# Patient Record
Sex: Male | Born: 1955
Health system: Southern US, Community
[De-identification: ages and names within clinical notes are randomized; demographics above are authoritative.]

## PROBLEM LIST (undated history)

## (undated) DIAGNOSIS — E119 Type 2 diabetes mellitus without complications: Secondary | ICD-10-CM

## (undated) DIAGNOSIS — R911 Solitary pulmonary nodule: Secondary | ICD-10-CM

## (undated) DIAGNOSIS — I10 Essential (primary) hypertension: Secondary | ICD-10-CM

## (undated) DIAGNOSIS — K219 Gastro-esophageal reflux disease without esophagitis: Secondary | ICD-10-CM

## (undated) DIAGNOSIS — A319 Mycobacterial infection, unspecified: Secondary | ICD-10-CM

## (undated) DIAGNOSIS — R059 Cough, unspecified: Secondary | ICD-10-CM

## (undated) DIAGNOSIS — J449 Chronic obstructive pulmonary disease, unspecified: Secondary | ICD-10-CM

## (undated) DIAGNOSIS — R05 Cough: Secondary | ICD-10-CM

## (undated) HISTORY — DX: Essential (primary) hypertension: I10

## (undated) HISTORY — DX: Cough: R05

## (undated) HISTORY — DX: Solitary pulmonary nodule: R91.1

## (undated) HISTORY — DX: Gastro-esophageal reflux disease without esophagitis: K21.9

## (undated) HISTORY — DX: Cough, unspecified: R05.9

## (undated) HISTORY — DX: Chronic obstructive pulmonary disease, unspecified: J44.9

## (undated) HISTORY — DX: Type 2 diabetes mellitus without complications: E11.9

## (undated) HISTORY — DX: Mycobacterial infection, unspecified: A31.9

## (undated) HISTORY — PX: BASAL CELL CARCINOMA EXCISION: SHX1214

---

## 2007-03-23 ENCOUNTER — Ambulatory Visit: Payer: Self-pay | Admitting: Internal Medicine

## 2007-04-25 ENCOUNTER — Ambulatory Visit: Payer: Self-pay | Admitting: Internal Medicine

## 2007-05-10 ENCOUNTER — Ambulatory Visit: Payer: Self-pay | Admitting: Internal Medicine

## 2007-06-14 ENCOUNTER — Ambulatory Visit: Payer: Self-pay | Admitting: Pulmonary Disease

## 2007-07-05 ENCOUNTER — Ambulatory Visit: Payer: Self-pay | Admitting: Internal Medicine

## 2007-08-16 ENCOUNTER — Ambulatory Visit: Payer: Self-pay | Admitting: Internal Medicine

## 2007-08-16 LAB — CONVERTED CEMR LAB
Basophils Absolute: 0.1 10*3/uL (ref 0.0–0.1)
Chloride: 107 meq/L (ref 96–112)
Direct LDL: 103.7 mg/dL
Eosinophils Absolute: 1 10*3/uL — ABNORMAL HIGH (ref 0.0–0.6)
GFR calc Af Amer: 132 mL/min
GFR calc non Af Amer: 109 mL/min
HCT: 44.5 % (ref 39.0–52.0)
HDL: 33.7 mg/dL — ABNORMAL LOW (ref >39.0)
MCHC: 35.5 g/dL (ref 30.0–36.0)
MCV: 90.9 fL (ref 78.0–100.0)
Neutro Abs: 5 10*3/uL (ref 1.4–7.7)
Neutrophils Relative %: 59.9 % (ref 43.0–77.0)
Platelets: 280 10*3/uL (ref 150–400)
RBC: 4.9 M/uL (ref 4.22–5.81)
Sodium: 143 meq/L (ref 135–145)
Triglycerides: 231 mg/dL (ref 0–149)

## 2007-09-01 ENCOUNTER — Ambulatory Visit: Payer: Self-pay | Admitting: Internal Medicine

## 2007-09-20 ENCOUNTER — Ambulatory Visit: Payer: Self-pay | Admitting: Pulmonary Disease

## 2007-10-07 ENCOUNTER — Ambulatory Visit: Payer: Self-pay | Admitting: Emergency Medicine

## 2007-10-08 DIAGNOSIS — I10 Essential (primary) hypertension: Secondary | ICD-10-CM

## 2007-10-08 DIAGNOSIS — J449 Chronic obstructive pulmonary disease, unspecified: Secondary | ICD-10-CM

## 2007-10-08 DIAGNOSIS — K219 Gastro-esophageal reflux disease without esophagitis: Secondary | ICD-10-CM | POA: Insufficient documentation

## 2007-10-08 DIAGNOSIS — E1159 Type 2 diabetes mellitus with other circulatory complications: Secondary | ICD-10-CM | POA: Insufficient documentation

## 2007-10-08 DIAGNOSIS — J45909 Unspecified asthma, uncomplicated: Secondary | ICD-10-CM | POA: Insufficient documentation

## 2007-10-11 ENCOUNTER — Ambulatory Visit: Payer: Self-pay | Admitting: Cardiology

## 2007-10-21 ENCOUNTER — Ambulatory Visit: Payer: Self-pay | Admitting: Emergency Medicine

## 2007-12-09 ENCOUNTER — Ambulatory Visit: Payer: Self-pay | Admitting: Emergency Medicine

## 2007-12-09 DIAGNOSIS — J309 Allergic rhinitis, unspecified: Secondary | ICD-10-CM | POA: Insufficient documentation

## 2007-12-09 DIAGNOSIS — R059 Cough, unspecified: Secondary | ICD-10-CM | POA: Insufficient documentation

## 2007-12-09 DIAGNOSIS — R05 Cough: Secondary | ICD-10-CM

## 2007-12-09 DIAGNOSIS — R918 Other nonspecific abnormal finding of lung field: Secondary | ICD-10-CM | POA: Insufficient documentation

## 2007-12-09 DIAGNOSIS — J984 Other disorders of lung: Secondary | ICD-10-CM

## 2007-12-12 ENCOUNTER — Ambulatory Visit: Payer: Self-pay | Admitting: Cardiology

## 2007-12-18 DIAGNOSIS — J479 Bronchiectasis, uncomplicated: Secondary | ICD-10-CM | POA: Insufficient documentation

## 2008-01-16 ENCOUNTER — Ambulatory Visit: Payer: Self-pay | Admitting: Emergency Medicine

## 2008-01-18 DIAGNOSIS — A319 Mycobacterial infection, unspecified: Secondary | ICD-10-CM | POA: Insufficient documentation

## 2008-01-20 ENCOUNTER — Encounter: Payer: Self-pay | Admitting: Emergency Medicine

## 2008-01-21 ENCOUNTER — Encounter: Payer: Self-pay | Admitting: Emergency Medicine

## 2008-03-19 ENCOUNTER — Ambulatory Visit: Payer: Self-pay | Admitting: Emergency Medicine

## 2008-05-17 ENCOUNTER — Ambulatory Visit: Payer: Self-pay | Admitting: Emergency Medicine

## 2008-06-08 ENCOUNTER — Ambulatory Visit: Payer: Self-pay | Admitting: Internal Medicine

## 2008-06-18 ENCOUNTER — Telehealth (INDEPENDENT_AMBULATORY_CARE_PROVIDER_SITE_OTHER): Payer: Self-pay | Admitting: *Deleted

## 2008-06-18 ENCOUNTER — Ambulatory Visit: Payer: Self-pay | Admitting: Critical Care Medicine

## 2008-06-21 ENCOUNTER — Encounter: Payer: Self-pay | Admitting: Emergency Medicine

## 2008-06-21 ENCOUNTER — Telehealth: Payer: Self-pay | Admitting: Emergency Medicine

## 2008-06-25 ENCOUNTER — Ambulatory Visit: Payer: Self-pay | Admitting: Emergency Medicine

## 2008-07-19 ENCOUNTER — Ambulatory Visit: Payer: Self-pay | Admitting: Emergency Medicine

## 2008-07-31 ENCOUNTER — Encounter: Payer: Self-pay | Admitting: Emergency Medicine

## 2008-08-31 ENCOUNTER — Ambulatory Visit: Payer: Self-pay | Admitting: Emergency Medicine

## 2008-09-10 ENCOUNTER — Telehealth (INDEPENDENT_AMBULATORY_CARE_PROVIDER_SITE_OTHER): Payer: Self-pay

## 2008-09-19 ENCOUNTER — Ambulatory Visit: Payer: Self-pay | Admitting: Emergency Medicine

## 2008-09-19 DIAGNOSIS — R55 Syncope and collapse: Secondary | ICD-10-CM | POA: Insufficient documentation

## 2008-09-20 LAB — CONVERTED CEMR LAB
Basophils Absolute: 0 10*3/uL (ref 0.0–0.1)
CO2: 28 meq/L (ref 19–32)
Chloride: 100 meq/L (ref 96–112)
Eosinophils Absolute: 1 10*3/uL — ABNORMAL HIGH (ref 0.0–0.7)
GFR calc non Af Amer: 108 mL/min
Lymphocytes Relative: 22.7 % (ref 12.0–46.0)
MCHC: 35.6 g/dL (ref 30.0–36.0)
Neutrophils Relative %: 54.1 % (ref 43.0–77.0)
Platelets: 220 10*3/uL (ref 150–400)
Potassium: 3.9 meq/L (ref 3.5–5.1)
Pro B Natriuretic peptide (BNP): 9 pg/mL (ref 0.0–100.0)
RBC: 5.69 M/uL (ref 4.22–5.81)
RDW: 12.9 % (ref 11.5–14.6)
Sodium: 140 meq/L (ref 135–145)
TSH: 0.87 microintl units/mL (ref 0.35–5.50)

## 2008-09-24 ENCOUNTER — Ambulatory Visit: Payer: Self-pay | Admitting: Cardiology

## 2008-09-25 ENCOUNTER — Ambulatory Visit: Payer: Self-pay | Admitting: Emergency Medicine

## 2008-09-27 ENCOUNTER — Ambulatory Visit: Payer: Self-pay | Admitting: Cardiology

## 2008-10-03 ENCOUNTER — Ambulatory Visit: Payer: Self-pay

## 2008-10-03 ENCOUNTER — Encounter: Payer: Self-pay | Admitting: Emergency Medicine

## 2008-10-03 ENCOUNTER — Encounter: Payer: Self-pay | Admitting: Cardiology

## 2008-10-15 ENCOUNTER — Ambulatory Visit: Payer: Self-pay | Admitting: Emergency Medicine

## 2008-11-26 ENCOUNTER — Ambulatory Visit: Payer: Self-pay | Admitting: Emergency Medicine

## 2008-12-10 ENCOUNTER — Ambulatory Visit: Payer: Self-pay | Admitting: Cardiology

## 2008-12-19 ENCOUNTER — Ambulatory Visit: Payer: Self-pay | Admitting: Emergency Medicine

## 2008-12-20 ENCOUNTER — Ambulatory Visit: Payer: Self-pay | Admitting: Emergency Medicine

## 2009-01-28 ENCOUNTER — Ambulatory Visit: Payer: Self-pay | Admitting: Emergency Medicine

## 2009-01-28 LAB — CONVERTED CEMR LAB
Basophils Absolute: 0 10*3/uL (ref 0.0–0.1)
Basophils Relative: 0.1 % (ref 0.0–3.0)
Hemoglobin: 17.3 g/dL — ABNORMAL HIGH (ref 13.0–17.0)
IgE (Immunoglobulin E), Serum: 148.2 intl units/mL (ref 0.0–180.0)
Lymphocytes Relative: 19.1 % (ref 12.0–46.0)
Monocytes Relative: 8 % (ref 3.0–12.0)
Neutro Abs: 6.1 10*3/uL (ref 1.4–7.7)
Neutrophils Relative %: 58.2 % (ref 43.0–77.0)
RBC: 5.37 M/uL (ref 4.22–5.81)

## 2009-02-27 ENCOUNTER — Ambulatory Visit: Payer: Self-pay | Admitting: Emergency Medicine

## 2009-03-26 ENCOUNTER — Ambulatory Visit: Payer: Self-pay | Admitting: Emergency Medicine

## 2009-03-29 ENCOUNTER — Encounter: Payer: Self-pay | Admitting: Emergency Medicine

## 2009-05-13 ENCOUNTER — Ambulatory Visit: Payer: Self-pay | Admitting: Emergency Medicine

## 2009-05-16 ENCOUNTER — Ambulatory Visit: Payer: Self-pay | Admitting: Internal Medicine

## 2009-05-22 ENCOUNTER — Ambulatory Visit: Payer: Self-pay | Admitting: Emergency Medicine

## 2009-07-25 ENCOUNTER — Ambulatory Visit: Payer: Self-pay | Admitting: Emergency Medicine

## 2009-09-23 ENCOUNTER — Ambulatory Visit: Payer: Self-pay | Admitting: Emergency Medicine

## 2009-10-16 ENCOUNTER — Encounter: Payer: Self-pay | Admitting: Emergency Medicine

## 2009-12-09 ENCOUNTER — Ambulatory Visit: Payer: Self-pay | Admitting: Emergency Medicine

## 2009-12-11 ENCOUNTER — Encounter: Payer: Self-pay | Admitting: Emergency Medicine

## 2010-03-03 ENCOUNTER — Ambulatory Visit: Payer: Self-pay | Admitting: Emergency Medicine

## 2010-03-26 ENCOUNTER — Ambulatory Visit: Payer: Self-pay | Admitting: Emergency Medicine

## 2010-04-25 ENCOUNTER — Ambulatory Visit: Payer: Self-pay | Admitting: Emergency Medicine

## 2010-06-06 ENCOUNTER — Ambulatory Visit: Payer: Self-pay | Admitting: Emergency Medicine

## 2010-07-14 ENCOUNTER — Telehealth: Payer: Self-pay | Admitting: Emergency Medicine

## 2010-07-30 ENCOUNTER — Ambulatory Visit: Payer: Self-pay | Admitting: Emergency Medicine

## 2010-07-30 LAB — CONVERTED CEMR LAB
Basophils Relative: 0.7 % (ref 0.0–3.0)
Eosinophils Absolute: 1.1 10*3/uL — ABNORMAL HIGH (ref 0.0–0.7)
Eosinophils Relative: 12.2 % — ABNORMAL HIGH (ref 0.0–5.0)
Hemoglobin: 17.2 g/dL — ABNORMAL HIGH (ref 13.0–17.0)
Lymphocytes Relative: 21.3 % (ref 12.0–46.0)
MCHC: 34.9 g/dL (ref 30.0–36.0)
MCV: 91.6 fL (ref 78.0–100.0)
Neutro Abs: 5.1 10*3/uL (ref 1.4–7.7)
Neutrophils Relative %: 55.8 % (ref 43.0–77.0)
RBC: 5.36 M/uL (ref 4.22–5.81)
WBC: 9.2 10*3/uL (ref 4.5–10.5)

## 2010-08-29 ENCOUNTER — Ambulatory Visit: Payer: Self-pay | Admitting: Emergency Medicine

## 2010-09-01 ENCOUNTER — Ambulatory Visit: Payer: Self-pay | Admitting: Internal Medicine

## 2010-10-01 ENCOUNTER — Ambulatory Visit: Payer: Self-pay | Admitting: Internal Medicine

## 2010-10-17 ENCOUNTER — Ambulatory Visit: Payer: Self-pay | Admitting: Emergency Medicine

## 2010-10-22 ENCOUNTER — Encounter: Payer: Self-pay | Admitting: Emergency Medicine

## 2010-11-28 ENCOUNTER — Ambulatory Visit: Payer: Self-pay | Admitting: Internal Medicine

## 2010-12-01 ENCOUNTER — Ambulatory Visit: Payer: Self-pay | Admitting: Internal Medicine

## 2010-12-06 ENCOUNTER — Encounter: Payer: Self-pay | Admitting: Internal Medicine

## 2010-12-18 ENCOUNTER — Ambulatory Visit: Payer: Self-pay | Admitting: Emergency Medicine

## 2010-12-29 ENCOUNTER — Telehealth (INDEPENDENT_AMBULATORY_CARE_PROVIDER_SITE_OTHER): Payer: Self-pay | Admitting: *Deleted

## 2010-12-31 ENCOUNTER — Encounter: Payer: Self-pay | Admitting: Adult Health

## 2011-01-03 ENCOUNTER — Ambulatory Visit: Payer: Self-pay | Admitting: Internal Medicine

## 2011-01-07 ENCOUNTER — Ambulatory Visit: Payer: Self-pay | Admitting: Internal Medicine

## 2011-01-08 ENCOUNTER — Ambulatory Visit: Payer: Self-pay | Admitting: Internal Medicine

## 2011-01-09 ENCOUNTER — Ambulatory Visit: Payer: Self-pay | Admitting: Internal Medicine

## 2011-01-11 ENCOUNTER — Encounter: Payer: Self-pay | Admitting: Emergency Medicine

## 2011-01-11 ENCOUNTER — Encounter: Payer: Self-pay | Admitting: Family Medicine

## 2011-01-14 ENCOUNTER — Ambulatory Visit: Payer: Self-pay | Admitting: Internal Medicine

## 2011-01-15 ENCOUNTER — Ambulatory Visit: Payer: Self-pay | Admitting: Internal Medicine

## 2011-01-18 ENCOUNTER — Ambulatory Visit: Payer: Self-pay | Admitting: Internal Medicine

## 2011-01-20 ENCOUNTER — Ambulatory Visit: Payer: Self-pay | Admitting: Internal Medicine

## 2011-01-20 NOTE — Assessment & Plan Note (Signed)
Summary: allergies/apc   Vital Signs:  Patient profile:   55 year old male Height:      73 inches Weight:      240.38 pounds BMI:     31.83 O2 Sat:      94 % on Room air Pulse rate:   103 / minute BP sitting:   136 / 80  (left arm) Cuff size:   regular  Vitals Entered By: Reynaldo Minium CMA (October 01, 2010 10:33 AM)  O2 Flow:  Room air CC: Allergy Consult-Dr.Byrum   Copy to:  Juanito Doom Primary Provider/Referring Provider:  Lysbeth Galas, [facility] [place]  CC:  Allergy Consult-Dr.Byrum.  History of Present Illness: October 01, 2010- 54 yoM followed by Dr Lysbeth Galas for primary care and coming today w/ his grandson on referral by [name] for allergy evaluation. He has hx of bronchiectasis, reactive airways, treated MAIC, acute sinusitis (CT sinus 2008). Recently IgE elevated 116.5 without specific antibodies elevated on allergy profile, Eos 12.2%. He has been on maintnenance prednisone at 5 mg every other day, currently off x 5 days. He says his main problem is cough with difficulty clearing mucus from his chest and heasy DOE. 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 This is a dusty process. Quit smoking 2008. Recent office notes and labs reviewed.    Preventive Screening-Counseling & Management  Alcohol-Tobacco     Smoking Status: quit     Packs/Day: 1.5     Year Started: 1978     Year Quit: 2008     Pack years: 76  Current Medications (verified): 1)  Mucinex Dm 30-600 Mg  Tb12 (Dextromethorphan-Guaifenesin) .... Take 1-2 Tablets Two Times A Day As Needed 2)  Proair Hfa 108 (90 Base) Mcg/act  Aers (Albuterol Sulfate) .... Inhale 2 Puffs Every 4 Hrs As Needed 3)  Claritin 10 Mg  Tabs (Loratadine) .... Take 1 Tablet By Mouth Once A Day 4)  Symbicort 160-4.5 Mcg/act  Aero (Budesonide-Formoterol Fumarate) .... Inhale 2 Puffs Two Times A Day 5)  Ipratropium-Albuterol 0.5-2.5 (3) Mg/42ml Soln  (Ipratropium-Albuterol) .Marland Kitchen.. 1 Vial in Nebulizer Up To Four Times Dailyprn 6)  Prednisone 5 Mg Tabs (Prednisone) .Marland Kitchen.. 1 By Mouth Daily 7)  Robitussin Cough/cold Cf 5-10-100 Mg/63ml Liqd (Phenylephrine-Dm-Gg) .... As Directed On Bottle 8)  Omeprazole 20 Mg Cpdr (Omeprazole) .Marland Kitchen.. 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 quit 2007, 1-1.5ppd x30years no alcohol married retired: Aeronautical engineer  Risk Factors: Smoking Status: quit (10/01/2010) Packs/Day: 1.5 (10/01/2010)  Past Surgical History: Resected basal cell cancer face  Social History: Pack years:  49 Packs/Day:  1.5  Review of Systems      See HPI       The patient complains of shortness of breath with activity, shortness of breath at rest, productive cough, non-productive cough, and change in color of mucus.  The patient denies coughing up blood, chest pain, irregular heartbeats, acid heartburn, indigestion, loss of appetite, weight change, abdominal pain, difficulty  swallowing, sore throat, tooth/dental problems, headaches, nasal congestion/difficulty breathing through nose, sneezing, itching, ear ache, anxiety, depression, hand/feet swelling, joint stiffness or pain, rash, and fever.    Physical Exam  Additional Exam:  GEN: A/Ox3; pleasant , NAD, muscular build HEENT:  Casey/AT, , EACs-clear, TMs-wnl, NOSE-clear, THROAT-edentulous, Mallampati   II NECK:  Supple w/ fair ROM; no JVD; normal carotid impulses w/o bruits; no thyromegaly or nodules palpated; no lymphadenopathy. RESP  Coarse BS w/ exp wheezing, dry cough CARD:  RRR, no m/r/g   GI:   Soft & nt; nml bowel sounds; no organomegaly or masses detected. Musco: Warm bil,  no calf tenderness edema, clubbing, pulses intact Neuro: intact w/ no focal deficits noted. Stammer   Impression & Recommendations:  Problem # 1:  ASTHMA (ICD-493.90) Chronic bronchitis with a reactive "asthma" component and moderate level IgE. Hx doesn't identify specific triggers, but he has heavy agricultural dust exposures.. There is likely a tobacco related chronic bronchits and residual scarring from his previous MAIC infection.  We discussed allergy skin testing and will have him return off antihistamines (which he takes without recognizing any benefit). He will remain off prednisone for this interval as well.  Problem # 2:  ALLERGIC RHINITIS CAUSE UNSPECIFIED (ICD-477.9) He doesn't volunteer active nasal symptoms beyond some postnasal drip sensation. We don't have evidence of a chronic sinusitis or active seasonal rhinitis now.  His updated medication list for this problem includes:    Claritin 10 Mg Tabs (Loratadine) .Marland Kitchen... Take 1 tablet by mouth once a day  Other Orders: Consultation Level IV (24401)  Patient Instructions: 1)  Return as able for allergy skin testing. Stop all antihistamines 3 days before skin testing, including cold and allergy meds, otc sleep and cough meds. This includes Claritin.

## 2011-01-20 NOTE — Progress Notes (Signed)
Summary: Predinsone Refill request  Phone Note Refill Request Message from:  Fax from Pharmacy on July 14, 2010 3:52 PM  Refills Requested: Medication #1:  Predinsone 10mg    Supply Requested: 3 months   Notes: Please confirm strength and approval for RX; pt gets through [facility]. This strength was given last and told to take 1 daily-EMR med list is different. Next Appointment Scheduled: 07/2010 Initial call taken by: Reynaldo Minium CMA,  July 14, 2010 3:53 PM  Follow-up for Phone Call        Needs 5mg  by mouth every other day per our last notes. Please fill it this way. Thanks. Leslye Peer MD  July 18, 2010 11:58 AM  Follow-up by: Leslye Peer MD,  July 18, 2010 11:58 AM    Prescriptions: PREDNISONE 5 MG TABS (PREDNISONE) 1 by mouth every other day  #45 x 3   Entered by:   [name] CMA   Authorized by:   Leslye Peer MD   Signed by:   [name] CMA on 07/18/2010   Method used:   Faxed to ...       [facility] (mail-order)             , [place]         Ph: [phone]       Fax: [phone]   RxID:   [id number]

## 2011-01-20 NOTE — Assessment & Plan Note (Signed)
Summary: bronchiectasis, asthma   Visit Type:  Follow-up Copy to:  Juanito Doom Primary Provider/Referring Provider:  Lysbeth Galas, [facility] [place]  CC:  Asthma follow-up.  The patient says he is still having sob with exertion and increased wheezing. He also c/o increased heartburn after eating.  He denies any cough..  History of Present Illness: Craig Arnold is a 55 year old man with COPD, chronic cough with chronic postnasal drip, and bronchodilator responsiveness.  Also has documented [facility], completed  therapy (3/09 - 10/09). Follow up sputum negative. Repeat 12/09 showed AFB negative, fungal negative normal flora. Have considered him for Xolair but IgE and eosinophils low in the past.   CT scan of the chest 10/09 showed improvement in nodular infiltrates. Then 12/09 repeat CT showed stable bronchiectasis and emphysematous changes. LLL nodule has resolved, new 9mm RUL nodule now identified. Repeat in 5/10 showed resolution of RUL nodule.  Repeat sputum 12/09 showed AFB negative, fungal negative, normal flora  ROV 12/09/09 -- returns for regular f/u. he reports that he continues to have exertional dyspnea and cough, productive of light yellow to green mucous, sometimes clear. Has been on Symbicort, duonebs as needed and pred 5 every other day.   ROV 03/03/10 -- returns for regular f/u, having more frequent cough but it is not productive. Also with wheezing. Repeat AFB cx in 12/10 was negative. No steroids or abx since last visit. Uses ProAir frequently, especially when he has to be active.   ROV 03/25/10 -- returns after treatment for acute exacerbation. Tells me that he can still be wheezy and have exertional SOB that varies. Some days he can walk farther than others. C/o intermittant heartburn, seems to happening more frequently. Appears to be close to baseline.   Current Medications (verified): 1)  Mucinex Dm 30-600 Mg  Tb12 (Dextromethorphan-Guaifenesin) .... Take 1-2 Tablets Two Times A Day  As Needed 2)  Proair Hfa 108 (90 Base) Mcg/act  Aers (Albuterol Sulfate) .... Inhale 2 Puffs Every 4 Hrs As Needed 3)  Claritin 10 Mg  Tabs (Loratadine) .... Take 1 Tablet By Mouth Once A Day 4)  Symbicort 160-4.5 Mcg/act  Aero (Budesonide-Formoterol Fumarate) .... Inhale 2 Puffs Two Times A Day 5)  Ipratropium-Albuterol 0.5-2.5 (3) Mg/10ml Soln (Ipratropium-Albuterol) .Marland Kitchen.. 1 Vial in Nebulizer Up To Four Times Dailyprn 6)  Prednisone 5 Mg Tabs (Prednisone) .Marland Kitchen.. 1 By Mouth Every Other Day 7)  Robitussin Cough/cold Cf 5-10-100 Mg/50ml Liqd (Phenylephrine-Dm-Gg) .... As Directed On Bottle  Allergies (verified): No Known Drug Allergies  Vital Signs:  Patient profile:   55 year old male Height:      73 inches (185.42 cm) Weight:      238.50 pounds (108.41 kg) BMI:     31.58 O2 Sat:      99 % on Room air Temp:     97.4 degrees F (36.33 degrees C) oral Pulse rate:   90 / minute BP sitting:   140 / 96  (left arm) Cuff size:   regular  Vitals Entered By: [name] CMA (March 26, 2010 10:48 AM)  O2 Sat at Rest %:  99 O2 Flow:  Room air  Serial Vital Signs/Assessments:  Comments: 11:09 AM Ambulatory Pulse Oximetry  Resting; HR__89___    02 Sat__99___  Lap1 (185 feet)   HR___105__   02 Sat___98__ Lap2 (185 feet)   HR__106___   02 Sat__96___    Lap3 (185 feet)   HR__105__   02 Sat__95___  __x_Test Completed without Difficulty ___Test  Stopped due to:  By: [name] CMA    Physical Exam  General:  normal appearance, healthy appearing, and thin.   Head:  normocephalic and atraumatic Nose:  no deformity, discharge, inflammation, or lesions Mouth:  no deformity or lesions Neck:  no masses, thyromegaly, or abnormal cervical nodes Chest Wall:  no deformities noted Lungs:  still with some soft bilateral expiratory wheezes but much improved  Heart:  regular rate and rhythm, S1, S2 without murmurs, rubs, gallops, or clicks Abdomen:  not examined Msk:  no deformity or scoliosis  noted with normal posture Extremities:  no clubbing, cyanosis, edema, or deformity noted Neurologic:  nonfocal Skin:  intact without lesions or rashes Psych:  alert and cooperative; normal mood and affect; normal attention span and concentration   Impression & Recommendations:  Problem # 1:  ASTHMA (ICD-493.90) ? whether GERD is contributing to the difficulty we are having managing his asthma. Seems to be close to baseline now after pred burst.  - continue pred 5mg  every other day  - start omeprazole 20mg  by mouth once daily for the next month to see if this helps asthma management - symbicort two times a day + as needed SABA - walking oximetry today showed that he does not desaturate - ROV 1 month  Problem # 2:  BRONCHIECTASIS (ICD-494.0) Hx of MAIC, last was negative in 12/10  Problem # 3:  COUGH (ICD-786.2)  Orders: Est. Patient Level IV (16109)  Medications Added to Medication List This Visit: 1)  Ipratropium-albuterol 0.5-2.5 (3) Mg/34ml Soln (Ipratropium-albuterol) .Marland Kitchen.. 1 vial in nebulizer up to four times dailyprn 2)  Prednisone 5 Mg Tabs (Prednisone) .Marland Kitchen.. 1 by mouth every other day 3)  Omeprazole 20 Mg Cpdr (Omeprazole) .Marland Kitchen.. 1 by mouth once daily  Patient Instructions: 1)  Continue your Symbicort two times a day 2)  Use ProAir as needed.  3)  Use your nebulized medications as needed  4)  Walking oximetry today  5)  Continue your prednisone 5mg  by mouth every other day  6)  Start omperazole 20mg  by mouth once daily for the next month. Follow up with [name] in 1 month to assess your breathing  Prescriptions: OMEPRAZOLE 20 MG CPDR (OMEPRAZOLE) 1 by mouth once daily  #30 x 3   Entered and Authorized by:   Leslye Peer MD   Signed by:   Leslye Peer MD on 03/26/2010   Method used:   Electronically to        [facility] [phone]* (retail)       [address]       [place], [place]  [address]       Ph: [phone] or [phone]        Fax: [phone]   RxID:   [id number]

## 2011-01-20 NOTE — Assessment & Plan Note (Signed)
Summary: ROV/MHH  Wrong appt type.Reynaldo Minium CMA  November 28, 2010 3:39 PM         Allergies: No Known Drug Allergies   Other Orders: No Charge Patient Arrived (NCPA0) (NCPA0)

## 2011-01-20 NOTE — Assessment & Plan Note (Signed)
Summary: asthma, COPD, hypoxemia   Visit Type:  Follow-up Copy to:  Juanito Doom Primary Neria Procter/Referring Angle Karel:  [place], [facility] [place]  CC:  6 wk follow up.  Breathing unchanged from last OV -- still having SOB with exertion, wheezing, and prod cough with clear mucus.  .  History of Present Illness: Craig Arnold is a 55 year old man with COPD, chronic cough with chronic postnasal drip, and bronchodilator responsiveness.  Also has documented [facility], completed  therapy (3/09 - 10/09). Follow up sputum negative. Repeat 12/09 showed AFB negative, fungal negative normal flora. Have considered him for Xolair but IgE and eosinophils low in the past.   CT scan of the chest 10/09 showed improvement in nodular infiltrates. Then 12/09 and 08/2010 repeat CTs showed stable mild bronchiectasis and emphysematous changes.  Repeat sputum 12/10 showed AFB negative, fungal negative, normal flora  ROV 07/30/10 -- Hx bronchiectasis and asthma. Was seen in 6/11 for worsening cough, wheeze. Rx for exac with increased pred and Augmentin. Improved but still with cough and wheeze. Still on Pred 5 every other day ROV 08/29/10 -- returns for asthma, bronchiectasis, dyspnea. Also w hx MAIC.   ROV 10/17/10 -- Hx as above, on every other day pred 5mg . Since last visit seen by [name] re: allergic component to asthma control. His serum eval has been negative, but I want hime to get skin testing. Also has had CT scan that is stable as detailed below. Still with every day wheeze, daily cough prod of thick whitish mucous. Uses ProAir two times a day.   Current Medications (verified): 1)  Mucinex Dm 30-600 Mg  Tb12 (Dextromethorphan-Guaifenesin) .... Take 1-2 Tablets Two Times A Day As Needed 2)  Proair Hfa 108 (90 Base) Mcg/act  Aers (Albuterol Sulfate) .... Inhale 2 Puffs Every 4 Hrs As Needed 3)  Claritin 10 Mg  Tabs (Loratadine) .... Take 1 Tablet By Mouth Once A Day 4)  Symbicort 160-4.5 Mcg/act  Aero  (Budesonide-Formoterol Fumarate) .... Inhale 2 Puffs Two Times A Day 5)  Ipratropium-Albuterol 0.5-2.5 (3) Mg/58ml Soln (Ipratropium-Albuterol) .Marland Kitchen.. 1 Vial in Nebulizer Up To Four Times Dailyprn 6)  Prednisone 5 Mg Tabs (Prednisone) .Marland Kitchen.. 1 By Mouth Daily 7)  Robitussin Cough/cold Cf 5-10-100 Mg/51ml Liqd (Phenylephrine-Dm-Gg) .... As Directed On Bottle 8)  Omeprazole 20 Mg Cpdr (Omeprazole) .Marland Kitchen.. 1 By Mouth Once Daily  Allergies (verified): No Known Drug Allergies  Vital Signs:  Patient profile:   55 year old male Height:      73 inches Weight:      243.25 pounds BMI:     32.21 O2 Sat:      95 % on Room air Temp:     98.3 degrees F oral Pulse rate:   87 / minute BP sitting:   148 / 84  (right arm) Cuff size:   regular  Vitals Entered By: Gweneth Dimitri RN (October 17, 2010 2:25 PM)  O2 Flow:  Room air  CC: 6 wk follow up.  Breathing unchanged from last OV -- still having SOB with exertion, wheezing, prod cough with clear mucus.   Comments Medications reviewed with patient Daytime contact number verified with patient. Gweneth Dimitri RN  October 17, 2010 2:25 PM     CT of Chest  Procedure date:  09/01/2010  Findings:      Comparison: 05/16/2009   Findings: COPD changes again noted, most pronounced in the lung apices.  Areas of scarring seen in the right middle lobe and lingula, likely  related to prior MAC infection.  Minimal bronchiectasis in these lung segments.  No confluent opacities or effusions.   Heart is normal size. Aorta is normal caliber. Small scattered mediastinal lymph nodes, none pathologically enlarged.  No axillary adenopathy. Imaging into the upper abdomen shows no acute findings.   IMPRESSION: Chronic changes in the lingula and right middle lobe as described above.  Mild COPD.  Impression & Recommendations:  Problem # 1:  ASTHMA (ICD-493.90) Walking oximetry today Continue your Symbicort two times a day Use ProAir as needed  Schedule your allergy  skin testing with [name] Follow up with [name] in 2 months or as needed   Problem # 2:  COUGH (ICD-786.2)  Other Orders: DME Referral (DME) Est. Patient Level IV (16109)  Patient Instructions: 1)  Walking oximetry today 2)  Continue your Symbicort two times a day 3)  Use ProAir as needed  4)  Schedule your allergy skin testing with [name] 5)  Follow up with [name] in 2 months or as needed     Appended Document: asthma, COPD, hypoxemia Ambulatory Pulse Oximetry  Resting; HR__90___    02 Sat_94% RA____  Lap1 (185 feet)   HR_108____   02 Sat_94% RA____ Lap2 (185 feet)   HR_114____   02 Sat__90% RA___    Lap3 (185 feet)   HR_121_   02 Sat_88% RA____  ___Test Completed without Difficulty _x__Test Stopped due to:   Pt was placed on 2L cont, o2 sat increased to 94%.  Appended Document: asthma, COPD, hypoxemia The above append was entered in error.  The following is the correct information:  Ambulatory Pulse Oximetry  Resting; HR__90___    02 Sat_94% RA____  Lap1 (185 feet)   HR__108___   02 Sat__94% RA___ Lap2 (185 feet)   HR__114___   02 Sat__90% RA___    Lap3 (185 feet)   HR__121___   02 Sat__88% RA___  Pt was placed on 2L cont,  o2 sat increased to 94%. __x_Test Completed without Difficulty ___Test Stopped due to:

## 2011-01-20 NOTE — Assessment & Plan Note (Signed)
Summary: asthma, bronchiectasis   Visit Type:  Follow-up Copy to:  Juanito Doom Primary Provider/Referring Provider:  Lysbeth Galas, [facility] [place]  CC:  Bronchiectasis...asthma...the patient c/o increased SOB with exertion...cough with clear mucus...wheezing...worse since last OV.  History of Present Illness: Mr. Mcglocklin is a 55 year old man with COPD, chronic cough with chronic postnasal drip, and bronchodilator responsiveness.  Also has documented [facility], completed  therapy (3/09 - 10/09). Follow up sputum negative. Repeat 12/09 showed AFB negative, fungal negative normal flora. Have considered him for Xolair but IgE and eosinophils low in the past.   CT scan of the chest 10/09 showed improvement in nodular infiltrates. Then 12/09 repeat CT showed stable bronchiectasis and emphysematous changes. LLL nodule has resolved, new 9mm RUL nodule now identified. Repeat in 5/10 showed resolution of RUL nodule.  Repeat sputum 12/09 showed AFB negative, fungal negative, normal flora  ROV 03/25/10 -- returns after treatment for acute exacerbation. Tells me that he can still be wheezy and have exertional SOB that varies. Some days he can walk farther than others. C/o intermittant heartburn, seems to happening more frequently. Appears to be close to baseline.   ROV 04/25/10 -- Returns for f/u bronchiectasis, asthma and cough. Cough may be a bit better since starting the omeprazole. Still coughs every day. Using Symbicort two times a day. Uses ProAir about once a day, depends on hi slevel of activity. Sometimes wakes up in the middle of the night SOB and uses his ProAir.   June 06, 2010--Presents for an acute office visit. Complains of cough occ producing white mucus, wheezing, occ SOB x3-4days - denies f/c/s.  states was told by local pharmacy that he needs to start filling his duoneb through [facility].. Cough is getting worse. Wheezing is keeping him up .Denies chest pain,   orthopnea, hemoptysis, fever, n/v/d,  edema, headache,recent travel or antibiotics.   ROV 07/30/10 -- Hx bronchiectasis and asthma. Was seen in 6/11 for worsening cough, wheeze. Rx for exac with increased pred and Augmentin. Improved but still with cough and wheeze. Still on Pred 5 every other day ROV 08/29/10 -- returns for asthma, bronchiectasis, dyspnea. Also w hx MAIC. No change in wheeze or symptoms w incfrease from 5mg  pred every other day to 10mg  once daily. His IgE and eosinophils are elevated. RAST in 02/2009 was negative (on chronic steroids). Remains on Symbicort + ProAir.   Current Medications (verified): 1)  Mucinex Dm 30-600 Mg  Tb12 (Dextromethorphan-Guaifenesin) .... Take 1-2 Tablets Two Times A Day As Needed 2)  Proair Hfa 108 (90 Base) Mcg/act  Aers (Albuterol Sulfate) .... Inhale 2 Puffs Every 4 Hrs As Needed 3)  Claritin 10 Mg  Tabs (Loratadine) .... Take 1 Tablet By Mouth Once A Day 4)  Symbicort 160-4.5 Mcg/act  Aero (Budesonide-Formoterol Fumarate) .... Inhale 2 Puffs Two Times A Day 5)  Ipratropium-Albuterol 0.5-2.5 (3) Mg/21ml Soln (Ipratropium-Albuterol) .Marland Kitchen.. 1 Vial in Nebulizer Up To Four Times Dailyprn 6)  Prednisone 5 Mg Tabs (Prednisone) .Marland Kitchen.. 1 By Mouth Daily 7)  Robitussin Cough/cold Cf 5-10-100 Mg/3ml Liqd (Phenylephrine-Dm-Gg) .... As Directed On Bottle 8)  Omeprazole 20 Mg Cpdr (Omeprazole) .Marland Kitchen.. 1 By Mouth Once Daily  Allergies (verified): No Known Drug Allergies  Vital Signs:  Patient profile:   55 year old male Height:      73 inches (185.42 cm) Weight:      250.38 pounds (113.81 kg) BMI:     33.15 O2 Sat:      96 % on Room air  Temp:     98.3 degrees F (36.83 degrees C) oral Pulse rate:   98 / minute BP sitting:   148 / 82  (left arm) Cuff size:   large  Vitals Entered By: [name] CMA (August 29, 2010 10:30 AM)  O2 Sat at Rest %:  96 O2 Flow:  Room air CC: Bronchiectasis...asthma...the patient c/o increased SOB with exertion...cough with clear mucus...wheezing...worse since last  OV Comments Medications reviewed with the patient. Daytime phone verified. [name] [facility]  August 29, 2010 10:32 AM   Physical Exam  General:  normal appearance, healthy appearing, and thin.   Head:  normocephalic and atraumatic Nose:  no deformity, discharge, inflammation, or lesions Mouth:  no deformity or lesions Neck:  no masses, thyromegaly, or abnormal cervical nodes Chest Wall:  no deformities noted Lungs:  difuse soft bilateral wheezing Heart:  regular rate and rhythm, S1, S2 without murmurs, rubs, gallops, or clicks Abdomen:  not examined Msk:  no deformity or scoliosis noted with normal posture Extremities:  no clubbing, cyanosis, edema, or deformity noted Neurologic:  nonfocal Skin:  intact without lesions or rashes Psych:  alert and cooperative; normal mood and affect; normal attention span and concentration   Impression & Recommendations:  Problem # 1:  ASTHMA (ICD-493.90) has been very difficult to manage. I have hoped he would be a Xolair candidate in the past. Prior eos, IgE and RAST have been negative. Last visit IgE high-normal, eosinophils elevated.  - refer for skin testing (note on chronic pred) - repeat CT scan given hx MAIC, ? MAICdriving his refractory signs + bronchiectasis - rov 6 weeks   Problem # 2:  BRONCHIECTASIS (ICD-494.0) - CT chest  Medications Added to Medication List This Visit: 1)  Prednisone 5 Mg Tabs (Prednisone) .Marland Kitchen.. 1 by mouth daily  Other Orders: Est. Patient Level IV (16109) Radiology Referral (Radiology) Allergy Referral  (Allergy)  Patient Instructions: 1)  We will refer you to [name] to consider skin testing for allergic component to your asthma.  2)  We will repeat your CT scan of the chest 3)  Go back to Prednisone 5mg  every other day 4)  Continue your Symbicort two times a day 5)  Follow up with [name] in 6 weeks

## 2011-01-20 NOTE — Assessment & Plan Note (Signed)
Summary: bronchiectasis, asthma   Visit Type:  Follow-up Copy to:  Juanito Doom Primary Provider/Referring Provider:  Lysbeth Galas, [facility] [place]  CC:  Asthma.  Bronchiectasis. The patient states his cough is slightly better but still produces clear to sometimes yellow mucus. Still sob with exertion.Marland Kitchen  History of Present Illness: Craig Arnold is a 55 year old man with COPD, chronic cough with chronic postnasal drip, and bronchodilator responsiveness.  Also has documented [facility], completed  therapy (3/09 - 10/09). Follow up sputum negative. Repeat 12/09 showed AFB negative, fungal negative normal flora. Have considered him for Xolair but IgE and eosinophils low in the past.   CT scan of the chest 10/09 showed improvement in nodular infiltrates. Then 12/09 repeat CT showed stable bronchiectasis and emphysematous changes. LLL nodule has resolved, new 9mm RUL nodule now identified. Repeat in 5/10 showed resolution of RUL nodule.  Repeat sputum 12/09 showed AFB negative, fungal negative, normal flora  ROV 12/09/09 -- returns for regular f/u. he reports that he continues to have exertional dyspnea and cough, productive of light yellow to green mucous, sometimes clear. Has been on Symbicort, duonebs as needed and pred 5 every other day.   ROV 03/03/10 -- returns for regular f/u, having more frequent cough but it is not productive. Also with wheezing. Repeat AFB cx in 12/10 was negative. No steroids or abx since last visit. Uses ProAir frequently, especially when he has to be active.   ROV 03/25/10 -- returns after treatment for acute exacerbation. Tells me that he can still be wheezy and have exertional SOB that varies. Some days he can walk farther than others. C/o intermittant heartburn, seems to happening more frequently. Appears to be close to baseline.   ROV 04/25/10 -- Returns for f/u bronchiectasis, asthma and cough. Cough may be a bit better since starting the omeprazole. Still coughs every day.  Using Symbicort two times a day. Uses ProAir about once a day, depends on hi slevel of activity. Sometimes wakes up in the middle of the night SOB and uses his ProAir.   Current Medications (verified): 1)  Mucinex Dm 30-600 Mg  Tb12 (Dextromethorphan-Guaifenesin) .... Take 1-2 Tablets Two Times A Day As Needed 2)  Proair Hfa 108 (90 Base) Mcg/act  Aers (Albuterol Sulfate) .... Inhale 2 Puffs Every 4 Hrs As Needed 3)  Claritin 10 Mg  Tabs (Loratadine) .... Take 1 Tablet By Mouth Once A Day 4)  Symbicort 160-4.5 Mcg/act  Aero (Budesonide-Formoterol Fumarate) .... Inhale 2 Puffs Two Times A Day 5)  Ipratropium-Albuterol 0.5-2.5 (3) Mg/4ml Soln (Ipratropium-Albuterol) .Marland Kitchen.. 1 Vial in Nebulizer Up To Four Times Dailyprn 6)  Prednisone 5 Mg Tabs (Prednisone) .Marland Kitchen.. 1 By Mouth Every Other Day 7)  Robitussin Cough/cold Cf 5-10-100 Mg/33ml Liqd (Phenylephrine-Dm-Gg) .... As Directed On Bottle 8)  Omeprazole 20 Mg Cpdr (Omeprazole) .Marland Kitchen.. 1 By Mouth Once Daily  Allergies (verified): No Known Drug Allergies  Vital Signs:  Patient profile:   55 year old male Height:      83 inches (210.82 cm) Weight:      235 pounds (106.82 kg) BMI:     24.07 O2 Sat:      95 % on Room air Temp:     97.7 degrees F (36.50 degrees C) oral Pulse rate:   98 / minute BP sitting:   182 / 92  (left arm) Cuff size:   regular  Vitals Entered By: [name] CMA (Apr 25, 2010 1:41 PM)  O2 Sat at Rest %:  95 O2 Flow:  Room air CC: Asthma.  Bronchiectasis. The patient states his cough is slightly better but still produces clear to sometimes yellow mucus. Still sob with exertion. Comments Medications reviewed. Daytime phone number verified. [name] CMA  Apr 25, 2010 1:44 PM   Physical Exam  General:  normal appearance, healthy appearing, and thin.   Head:  normocephalic and atraumatic Nose:  no deformity, discharge, inflammation, or lesions Mouth:  no deformity or lesions Neck:  no masses, thyromegaly, or abnormal  cervical nodes Chest Wall:  no deformities noted Lungs:  still with some soft bilateral expiratory wheezes Heart:  regular rate and rhythm, S1, S2 without murmurs, rubs, gallops, or clicks Abdomen:  not examined Msk:  no deformity or scoliosis noted with normal posture Extremities:  no clubbing, cyanosis, edema, or deformity noted Neurologic:  nonfocal Skin:  intact without lesions or rashes Psych:  alert and cooperative; normal mood and affect; normal attention span and concentration   Impression & Recommendations:  Problem # 1:  ASTHMA (ICD-493.90)  Problem # 2:  BRONCHIECTASIS (ICD-494.0)  Problem # 3:  ALLERGIC RHINITIS CAUSE UNSPECIFIED (ICD-477.9)  His updated medication list for this problem includes:    Claritin 10 Mg Tabs (Loratadine) .Marland Kitchen... Take 1 tablet by mouth once a day  Problem # 4:  GERD (ICD-530.81)  His updated medication list for this problem includes:    Omeprazole 20 Mg Cpdr (Omeprazole) .Marland Kitchen... 1 by mouth once daily  Other Orders: Est. Patient Level IV (04540)  Patient Instructions: 1)  Continue your Symbicort two times a day 2)  Continue your prednisone every other day  3)  Continue your omeprazole once daily  4)  Use ProAir as needed  5)  Follow with [name] in 3 months or as needed  Prescriptions: OMEPRAZOLE 20 MG CPDR (OMEPRAZOLE) 1 by mouth once daily  #30 x 11   Entered and Authorized by:   Leslye Peer MD   Signed by:   Leslye Peer MD on 04/25/2010   Method used:   [facility] to        [facility] [phone][facility] (retail)       [address]       [place], [place]  [address]       Ph: [phone] or [phone]       Fax: [phone]   RxID:   [id number]

## 2011-01-20 NOTE — Assessment & Plan Note (Signed)
Summary: asthma, bronchiectasis   Visit Type:  Follow-up Copy to:  Juanito Doom Primary Provider/Referring Provider:  [place], [facility] [place]  CC:  3 month follow up.  states breathing is worse x2-3wks.  c/o increased SOB all the time, chest congestion, wheezing, and and a wet cough x2-3wks.  .  History of Present Illness: Mr. Garman is a 55 year old man with COPD, chronic cough with chronic postnasal drip, and bronchodilator responsiveness.  Also has documented [facility], completed  therapy (3/09 - 10/09). Follow up sputum negative. Repeat 12/09 showed AFB negative, fungal negative normal flora. Have considered him for Xolair but IgE and eosinophils low in the past.   CT scan of the chest 10/09 showed improvement in nodular infiltrates. Then 12/09 repeat CT showed stable bronchiectasis and emphysematous changes. LLL nodule has resolved, new 9mm RUL nodule now identified. Repeat in 5/10 showed resolution of RUL nodule.  Repeat sputum 12/09 showed AFB negative, fungal negative, normal flora  ROV 12/09/09 -- returns for regular f/u. he reports that he continues to have exertional dyspnea and cough, productive of light yellow to green mucous, sometimes clear. Has been on Symbicort, duonebs as needed and pred 5 every other day.   ROV 03/03/10 -- returns for regular f/u, having more frequent cough but it is not productive. Also with wheezing. Repeat AFB cx in 12/10 was negative. No steroids or abx since last visit. Uses ProAir frequently, especially when he has to be active.   Current Medications (verified): 1)  Mucinex Dm 30-600 Mg  Tb12 (Dextromethorphan-Guaifenesin) .... Take 1-2 Tablets Two Times A Day As Needed 2)  Proair Hfa 108 (90 Base) Mcg/act  Aers (Albuterol Sulfate) .... Inhale 2 Puffs Every 4 Hrs As Needed 3)  Claritin 10 Mg  Tabs (Loratadine) .... Take 1 Tablet By Mouth Once A Day 4)  Symbicort 160-4.5 Mcg/act  Aero (Budesonide-Formoterol Fumarate) .... Inhale 2 Puffs Two Times A  Day 5)  Ipratropium-Albuterol 0.5-2.5 (3) Mg/22ml Soln (Ipratropium-Albuterol) .Marland Kitchen.. 1 Vial in Nebulizer Up To Four Times Daily 6)  Prednisone 10 Mg Tabs (Prednisone) .... 40mg  Daily X 3days, 30mg  Daily X 3days, 20mg  Daily X 3days, 10mg  Daily X3 Days Then Go Back To Your Usual Dosing. 7)  Robitussin Cough/cold Cf 5-10-100 Mg/84ml Liqd (Phenylephrine-Dm-Gg) .... As Directed On Bottle  Allergies (verified): No Known Drug Allergies  Vital Signs:  Patient profile:   55 year old male Height:      73 inches Weight:      233.38 pounds BMI:     30.90 O2 Sat:      95 % on Room air Temp:     98.2 degrees F Pulse rate:   114 / minute BP sitting:   180 / 96  (left arm) Cuff size:   regular  Vitals Entered By: Gweneth Dimitri RN (March 03, 2010 2:49 PM)  O2 Flow:  Room air CC: 3 month follow up.  states breathing is worse x2-3wks.  c/o increased SOB all the time, chest congestion, wheezing, and a wet cough x2-3wks.   Comments Medications reviewed with patient Daytime contact number verified with patient. Gweneth Dimitri RN  March 03, 2010 2:50 PM    Physical Exam  General:  normal appearance, healthy appearing, and thin.   Head:  normocephalic and atraumatic Nose:  no deformity, discharge, inflammation, or lesions Mouth:  no deformity or lesions Neck:  no masses, thyromegaly, or abnormal cervical nodes Chest Wall:  no deformities noted Lungs:  bilateral exp wheezes, significantly worse  than last visit.  Heart:  regular rate and rhythm, S1, S2 without murmurs, rubs, gallops, or clicks Abdomen:  not examined Msk:  no deformity or scoliosis noted with normal posture Extremities:  no clubbing, cyanosis, edema, or deformity noted Neurologic:  nonfocal Skin:  intact without lesions or rashes Psych:  alert and cooperative; normal mood and affect; normal attention span and concentration   Impression & Recommendations:  Problem # 1:  ASTHMA (ICD-493.90) Moderate persistent asthma +  bronchiectasis. Increased wheezing compared with last visit, cough non-productive  - walking oximetry today showed desaturation at the end of three laps.  - prednisone taper for flare - no abx at this time  Problem # 2:  BRONCHIECTASIS (ICD-494.0)  Problem # 3:  COUGH (ICD-786.2)  Medications Added to Medication List This Visit: 1)  Robitussin Cough/cold Cf 5-10-100 Mg/71ml Liqd (Phenylephrine-dm-gg) .... As directed on bottle  Other Orders: Est. Patient Level IV (04540)  Patient Instructions: 1)  We will treat you for a flare of your bronchiectasis and asthma. Start prednisone taper and decrease as directed back down to 5mg  every other day  2)  Continue your inhaled medications as you are taking them 3)  Walking oximetry today  4)  Follow up with [name] in 3 weeks or as needed     Appended Document: asthma, bronchiectasis Ambulatory Pulse Oximetry  Resting; HR_107____    02 Sat__94% RA___  Lap1 (185 feet)   HR__106___   02 Sat__92% RA___ Lap2 (185 feet)   HR__100___   02 Sat__90% RA___    Lap3 (185 feet)   HR__99___   02 Sat__88% RA___  _x__Test Completed without Difficulty ___Test Stopped due to:

## 2011-01-20 NOTE — Miscellaneous (Signed)
Summary: Orders Update  Medications Added PREDNISONE 10 MG TABS (PREDNISONE) 40mg  daily x 4days, 30mg  daily x 4days, 20mg  daily x 4days, 10mg  daily x4 days then 5mg  daily x 4 days, then 5mg  every other day       Clinical Lists Changes  Medications: Added new medication of PREDNISONE 10 MG TABS (PREDNISONE) 40mg  daily x 4days, 30mg  daily x 4days, 20mg  daily x 4days, 10mg  daily x4 days then 5mg  daily x 4 days, then 5mg  every other day - Signed Rx of PREDNISONE 10 MG TABS (PREDNISONE) 40mg  daily x 4days, 30mg  daily x 4days, 20mg  daily x 4days, 10mg  daily x4 days then 5mg  daily x 4 days, then 5mg  every other day;  #42 x 0;  Signed;  Entered by: Leslye Peer MD;  Authorized by: Leslye Peer MD;  Method used: [facility] [phone]*, [address] [place], [place], [place]  [address], Ph: [phone] or [phone], Fax: [phone]    Prescriptions: PREDNISONE 10 MG TABS (PREDNISONE) 40mg  daily x 4days, 30mg  daily x 4days, 20mg  daily x 4days, 10mg  daily x4 days then 5mg  daily x 4 days, then 5mg  every other day  #42 x 0   Entered and Authorized by:   Leslye Peer MD   Signed by:   Leslye Peer MD on 03/03/2010   Method used:   [facility] to        [facility] [phone][facility] (retail)       [address]       [place], [place]  [address]       Ph: [phone] or [phone]       Fax: [phone]   RxID:   [id number]

## 2011-01-20 NOTE — Assessment & Plan Note (Signed)
Summary: bronchiectasis, asthma   Visit Type:  Follow-up Copy to:  Juanito Doom Primary Provider/Referring Provider:  Lysbeth Galas, [facility] [place]  CC:  Asthma.  Bronchiectasis.  The patient says his SOB iw worse with exertion and at rest.  Cough is sometimes prod with brown mucus..  History of Present Illness: [name] is a 55 year old man with COPD, chronic cough with chronic postnasal drip, and bronchodilator responsiveness.  Also has documented [facility], completed  therapy (3/09 - 10/09). Follow up sputum negative. Repeat 12/09 showed AFB negative, fungal negative normal flora. Have considered him for Xolair but IgE and eosinophils low in the past.   CT scan of the chest 10/09 showed improvement in nodular infiltrates. Then 12/09 repeat CT showed stable bronchiectasis and emphysematous changes. LLL nodule has resolved, new 9mm RUL nodule now identified. Repeat in 5/10 showed resolution of RUL nodule.  Repeat sputum 12/09 showed AFB negative, fungal negative, normal flora  ROV 03/25/10 -- returns after treatment for acute exacerbation. Tells me that he can still be wheezy and have exertional SOB that varies. Some days he can walk farther than others. C/o intermittant heartburn, seems to happening more frequently. Appears to be close to baseline.   ROV 04/25/10 -- Returns for f/u bronchiectasis, asthma and cough. Cough may be a bit better since starting the omeprazole. Still coughs every day. Using Symbicort two times a day. Uses ProAir about once a day, depends on hi slevel of activity. Sometimes wakes up in the middle of the night SOB and uses his ProAir.   June 06, 2010--Presents for an acute office visit. Complains of cough occ producing white mucus, wheezing, occ SOB x3-4days - denies f/c/s.  states was told by local pharmacy that he needs to start filling his duoneb through [facility].. Cough is getting worse. Wheezing is keeping him up .Denies chest pain,   orthopnea, hemoptysis, fever, n/v/d,  edema, headache,recent travel or antibiotics.   ROV 07/30/10 -- Hx bronchiectasis and asthma. Was seen in 6/11 for worsening cough, wheeze. Rx for exac with increased pred and Augmentin. Improved but still with cough and wheeze. Still on Pred 5 every other day   Current Medications (verified): 1)  Mucinex Dm 30-600 Mg  Tb12 (Dextromethorphan-Guaifenesin) .... Take 1-2 Tablets Two Times A Day As Needed 2)  Proair Hfa 108 (90 Base) Mcg/act  Aers (Albuterol Sulfate) .... Inhale 2 Puffs Every 4 Hrs As Needed 3)  Claritin 10 Mg  Tabs (Loratadine) .... Take 1 Tablet By Mouth Once A Day 4)  Symbicort 160-4.5 Mcg/act  Aero (Budesonide-Formoterol Fumarate) .... Inhale 2 Puffs Two Times A Day 5)  Ipratropium-Albuterol 0.5-2.5 (3) Mg/51ml Soln (Ipratropium-Albuterol) .Marland Kitchen.. 1 Vial in Nebulizer Up To Four Times Dailyprn 6)  Prednisone 5 Mg Tabs (Prednisone) .Marland Kitchen.. 1 By Mouth Every Other Day 7)  Robitussin Cough/cold Cf 5-10-100 Mg/6ml Liqd (Phenylephrine-Dm-Gg) .... As Directed On Bottle 8)  Omeprazole 20 Mg Cpdr (Omeprazole) .Marland Kitchen.. 1 By Mouth Once Daily  Allergies (verified): No Known Drug Allergies  Vital Signs:  Patient profile:   55 year old male Height:      73 inches (185.42 cm) Weight:      245.25 pounds (111.48 kg) BMI:     32.47 O2 Sat:      95 % on Room air Temp:     98.2 degrees F oral Pulse rate:   86 / minute BP sitting:   134 / 84  (right arm) Cuff size:   large  Vitals Entered By: Craig Fiscal  Excell Arnold CMA (July 30, 2010 9:29 AM)  O2 Sat at Rest %:  95 O2 Flow:  Room air CC: Asthma.  Bronchiectasis.  The patient says his SOB iw worse with exertion and at rest.  Cough is sometimes prod with brown mucus. Comments Medications reviewed with the patient. Daytime phone verified. [name] CMA  July 30, 2010 9:30 AM   Physical Exam  General:  normal appearance, healthy appearing, and thin.   Head:  normocephalic and atraumatic Nose:  no deformity, discharge, inflammation, or  lesions Mouth:  no deformity or lesions Neck:  no masses, thyromegaly, or abnormal cervical nodes Chest Wall:  no deformities noted Lungs:  difuse soft bilateral wheezing Heart:  regular rate and rhythm, S1, S2 without murmurs, rubs, gallops, or clicks Abdomen:  not examined Msk:  no deformity or scoliosis noted with normal posture Extremities:  no clubbing, cyanosis, edema, or deformity noted Neurologic:  nonfocal Skin:  intact without lesions or rashes Psych:  alert and cooperative; normal mood and affect; normal attention span and concentration   Impression & Recommendations:  Problem # 1:  BRONCHIECTASIS (ICD-494.0)  Problem # 2:  ASTHMA (ICD-493.90) - increase pred to 10mg  once daily  - recheck IgE and eosinophils, considering Xolair.   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   Other Orders: Est. Patient Level IV ([address]) TLB-CBC Platelet - w/Differential (85025-CBCD) T-IgE (Immunoglobulin E) (60454-09811)  Patient Instructions: 1)  Please increase your Prednisone to 10mg  by mouth once daily  2)  Continue your inhaled medications.  3)  Labwork today 4)  Follow up with [name] in 1 month to review your progress

## 2011-01-20 NOTE — Assessment & Plan Note (Signed)
Summary: Acute NP office visit - asthma   Copy to:  Juanito Doom Primary Provider/Referring Provider:  [place] [place]  CC:  cough occ producing white mucus, wheezing, and occ SOB x3-4days - denies f/c/s.  states was told by local pharmacy that he needs to start filling his duoneb through [facility]..  History of Present Illness: Craig Arnold is a 55 year old man with COPD, chronic cough with chronic postnasal drip, and bronchodilator responsiveness.  Also has documented [facility], completed  therapy (3/09 - 10/09). Follow up sputum negative. Repeat 12/09 showed AFB negative, fungal negative normal flora. Have considered him for Xolair but IgE and eosinophils low in the past.   CT scan of the chest 10/09 showed improvement in nodular infiltrates. Then 12/09 repeat CT showed stable bronchiectasis and emphysematous changes. LLL nodule has resolved, new 9mm RUL nodule now identified. Repeat in 5/10 showed resolution of RUL nodule.  Repeat sputum 12/09 showed AFB negative, fungal negative, normal flora  ROV 12/09/09 -- returns for regular f/u. he reports that he continues to have exertional dyspnea and cough, productive of light yellow to green mucous, sometimes clear. Has been on Symbicort, duonebs as needed and pred 5 every other day.   ROV 03/03/10 -- returns for regular f/u, having more frequent cough but it is not productive. Also with wheezing. Repeat AFB cx in 12/10 was negative. No steroids or abx since last visit. Uses ProAir frequently, especially when he has to be active.   ROV 03/25/10 -- returns after treatment for acute exacerbation. Tells me that he can still be wheezy and have exertional SOB that varies. Some days he can walk farther than others. C/o intermittant heartburn, seems to happening more frequently. Appears to be close to baseline.   ROV 04/25/10 -- Returns for f/u bronchiectasis, asthma and cough. Cough may be a bit better since starting the omeprazole. Still coughs every  day. Using Symbicort two times a day. Uses ProAir about once a day, depends on hi slevel of activity. Sometimes wakes up in the middle of the night SOB and uses his ProAir.  June 06, 2010--Presents for an acute office visit. Complains of cough occ producing white mucus, wheezing, occ SOB x3-4days - denies f/c/s.  states was told by local pharmacy that he needs to start filling his duoneb through [facility].. Cough is getting worse. Wheezing is keeping him up .Denies chest pain,   orthopnea, hemoptysis, fever, n/v/d, edema, headache,recent travel or antibiotics.   Medications Prior to Update: 1)  Mucinex Dm 30-600 Mg  Tb12 (Dextromethorphan-Guaifenesin) .... Take 1-2 Tablets Two Times A Day As Needed 2)  Proair Hfa 108 (90 Base) Mcg/act  Aers (Albuterol Sulfate) .... Inhale 2 Puffs Every 4 Hrs As Needed 3)  Claritin 10 Mg  Tabs (Loratadine) .... Take 1 Tablet By Mouth Once A Day 4)  Symbicort 160-4.5 Mcg/act  Aero (Budesonide-Formoterol Fumarate) .... Inhale 2 Puffs Two Times A Day 5)  Ipratropium-Albuterol 0.5-2.5 (3) Mg/69ml Soln (Ipratropium-Albuterol) .Marland Kitchen.. 1 Vial in Nebulizer Up To Four Times Dailyprn 6)  Prednisone 5 Mg Tabs (Prednisone) .Marland Kitchen.. 1 By Mouth Every Other Day 7)  Robitussin Cough/cold Cf 5-10-100 Mg/67ml Liqd (Phenylephrine-Dm-Gg) .... As Directed On Bottle 8)  Omeprazole 20 Mg Cpdr (Omeprazole) .Marland Kitchen.. 1 By Mouth Once Daily  Current Medications (verified): 1)  Mucinex Dm 30-600 Mg  Tb12 (Dextromethorphan-Guaifenesin) .... Take 1-2 Tablets Two Times A Day As Needed 2)  Proair Hfa 108 (90 Base) Mcg/act  Aers (Albuterol Sulfate) .... Inhale 2 Puffs  Every 4 Hrs As Needed 3)  Claritin 10 Mg  Tabs (Loratadine) .... Take 1 Tablet By Mouth Once A Day 4)  Symbicort 160-4.5 Mcg/act  Aero (Budesonide-Formoterol Fumarate) .... Inhale 2 Puffs Two Times A Day 5)  Ipratropium-Albuterol 0.5-2.5 (3) Mg/55ml Soln (Ipratropium-Albuterol) .Marland Kitchen.. 1 Vial in Nebulizer Up To Four Times Dailyprn 6)  Prednisone 5 Mg Tabs  (Prednisone) .Marland Kitchen.. 1 By Mouth Every Other Day 7)  Robitussin Cough/cold Cf 5-10-100 Mg/23ml Liqd (Phenylephrine-Dm-Gg) .... As Directed On Bottle 8)  Omeprazole 20 Mg Cpdr (Omeprazole) .Marland Kitchen.. 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 quit 2007, 1-1.5ppd x30years no alcohol married retired: Aeronautical engineer  Risk Factors: Smoking Status: quit (02/27/2009)  Family History: emphysema/COPD - father heart disease - father, pat uncle, MGF rheumatism - mother DM - mother, brother stroke - father  Social History: Patient states former smoker. quit 2007, 1-1.5ppd x30years no alcohol married retired: Aeronautical engineer  Review of Systems      See HPI  Vital Signs:  Patient profile:   55 year old male Height:      83 inches Weight:      231 pounds BMI:     23.66 O2 Sat:      95 % on Room air Temp:     98.0 degrees F oral Pulse rate:   94 / minute BP sitting:   130 / 70  (left arm) Cuff size:   regular  Vitals Entered By: Boone Master CNA/MA (June 06, 2010 10:38 AM)  O2 Flow:  Room air CC: cough occ producing white mucus, wheezing, occ SOB x3-4days - denies f/c/s.  states was told by local pharmacy that he needs to start filling his duoneb through [facility] Is Patient Diabetic? No Comments Medications reviewed with patient Daytime contact number verified with patient. Boone Master CNA/MA  June 06, 2010 10:39  AM    Physical Exam  Additional Exam:  GEN: A/Ox3; pleasant , NAD HEENT:  [place]/AT, , EACs-clear, TMs-wnl, NOSE-clear, THROAT-clear NECK:  Supple w/ fair ROM; no JVD; normal carotid impulses w/o bruits; no thyromegaly or nodules palpated; no lymphadenopathy. RESP  Coarse BS w/ exp wheezing  CARD:  RRR, no m/r/g   GI:   Soft & nt; nml bowel sounds; no organomegaly or masses detected. Musco: Warm bil,  no calf tenderness edema, clubbing, pulses intact Neuro: intact w/ no focal deficits noted.    Impression & Recommendations:  Problem # 1:  BRONCHIECTASIS (ICD-494.0)  Exacerbation  REC:  xopenex neb given in office  Augmentin 875mg  two times a day for 10 days  Mucinex DM two times a day as needed cough/congestion Increase your prednisone 10mg  4 tabs for 2 days, then 3 tabs for 2 days, 2 tabs for 2 days, then 1 tab for 2 days, then back to 5mg  once daily  Increase fluids.  Please contact office for sooner follow up if symptoms do not improve or worsen  follow up Dr. Delton Coombes as scheduled.   Orders: Est. Patient Level IV (04540)  Medications Added to Medication List This Visit: 1)  Augmentin 875-125 Mg Tabs (Amoxicillin-pot clavulanate) .Marland Kitchen.. 1 by mouth two times a day 2)  Prednisone 10 Mg Tabs (Prednisone) .... 4 tabs for 2 days, then 3 tabs for 2 days, 2 tabs for 2 days, then 1 tab for 2 days, then stop  Complete Medication List: 1)  Mucinex Dm 30-600 Mg Tb12 (Dextromethorphan-guaifenesin) .... Take 1-2 tablets two times a day as needed 2)  Proair Hfa 108 (90 Base) Mcg/act Aers (Albuterol sulfate) .... Inhale 2 puffs every 4 hrs as needed 3)  Claritin 10 Mg Tabs (Loratadine) .... Take 1 tablet by mouth once a day 4)  Symbicort 160-4.5 Mcg/act Aero (Budesonide-formoterol fumarate) .... Inhale 2 puffs two times a day 5)  Ipratropium-albuterol 0.5-2.5 (3) Mg/36ml Soln (Ipratropium-albuterol) .Marland Kitchen.. 1 vial in nebulizer up to four times dailyprn 6)  Prednisone 5 Mg Tabs (Prednisone) .Marland Kitchen.. 1 by  mouth every other day 7)  Robitussin Cough/cold Cf 5-10-100 Mg/44ml Liqd (Phenylephrine-dm-gg) .... As directed on bottle 8)  Omeprazole 20 Mg Cpdr (Omeprazole) .Marland Kitchen.. 1 by mouth once daily 9)  Augmentin 875-125 Mg Tabs (Amoxicillin-pot clavulanate) .Marland Kitchen.. 1 by mouth two times a day 10)  Prednisone 10 Mg Tabs (Prednisone) .... 4 tabs for 2 days, then 3 tabs for 2 days, 2 tabs for 2 days, then 1 tab for 2 days, then stop  Patient Instructions: 1)  Augmentin 875mg  two times a day for 10 days  2)  Mucinex DM two times a day as needed cough/congestion 3)  Increase your prednisone 10mg  4 tabs for 2 days, then 3 tabs for 2 days, 2 tabs for 2 days, then 1 tab for 2 days, then back to 5mg  once daily  4)  Increase fluids.  5)  Please contact office for sooner follow up if symptoms do not improve or worsen  6)  follow up Dr. Delton Coombes as scheduled.  Prescriptions: IPRATROPIUM-ALBUTEROL 0.5-2.5 (3) MG/3ML SOLN (IPRATROPIUM-ALBUTEROL) 1 vial in nebulizer up to four times dailyPRN  #360 x 3   Entered and Authorized by:   Rubye Oaks NP   Signed by:   Hayato Guaman NP on 06/06/2010   Method used:   Faxed to ...       [facility] (mail-order)             , [place]         Ph: [phone]       Fax: [phone]   RxID:   [id number] PREDNISONE 10 MG TABS (PREDNISONE) 4 tabs for 2 days, then 3 tabs for 2 days, 2 tabs for 2 days, then 1 tab for 2 days, then stop  #20 x 0   Entered and Authorized by:   Rubye Oaks NP   Signed by:   Rubye Oaks NP on 06/06/2010   Method used:   Electronically to        [facility] [phone]* (retail)       [address]       [place], [place]  [address]       Ph: [phone] or [phone]       Fax: [phone]   RxID:   [id number] AUGMENTIN 875-125 MG TABS (AMOXICILLIN-POT CLAVULANATE) 1 by mouth two times a day  #20 x 0   Entered and Authorized by:   Rubye Oaks NP   Signed by:   Croix Presley NP on 06/06/2010   Method used:    [facility]  [address] [phone]* (retail)       [address]       [place], [place]  [address]       Ph: [phone] or [phone]       Fax: [phone]   RxID:   [id number]     Appended Document: Acute NP office visit - asthma-neb tx     Clinical Lists Changes  Orders: Added new Service order of Nebulizer Tx (63016) - Signed       Medication Administration  Medication # 1:    Medication: Xopenex 1.25mg     Diagnosis: BRONCHIECTASIS (ICD-494.0)    Dose: 1 vial    Route: inhaled    Exp Date: 08/2010    Lot #: [id number]    Mfr: Sepracor    Patient tolerated medication without complications    Given by: Reynaldo Minium CMA (June 06, 2010 11:01 AM)  Orders Added: 1)  Nebulizer Tx [phone]

## 2011-01-20 NOTE — Assessment & Plan Note (Signed)
Summary: ALT/MHH   Vital Signs:  Patient profile:   55 year old male Height:      73 inches Weight:      235.38 pounds BMI:     31.17 O2 Sat:      94 % on Room air Pulse rate:   100 / minute BP sitting:   144 / 86  (left arm) Cuff size:   regular  Vitals Entered By: Reynaldo Minium CMA (November 28, 2010 3:40 PM)  O2 Flow:  Room air CC: Allergy Testing   Copy to:  Juanito Doom Primary Katilyn Miltenberger/Referring Gavriel Holzhauer:  Lysbeth Galas, [facility] [place]  CC:  Allergy Testing.  History of Present Illness: History of Present Illness: October 01, 2010- 54 yoM followed by Dr Lysbeth Galas for primary care and coming today w/ his grandson on referral by [name] for allergy evaluation. He has hx of bronchiectasis, reactive airways, treated MAIC, acute sinusitis (CT sinus 2008). Recently IgE elevated 116.5 without specific antibodies elevated on allergy profile, Eos 12.2%. He has been on maintnenance prednisone at 5 mg every other day, currently off x 5 days. He says his main problem is cough with difficulty clearing mucus from his chest and heasy DOE. 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 This is a dusty process. Quit smoking 2008. Recent office notes and labs reviewed.   November 28, 2010- Bronchiectasis, hx MAIC, sinusitis,  Coughing up thick mucus "like jello". Denies fever. Occasional sneeze.  Here today for allergy testing. IgE was 116. Brother has COPD- smokes. Denies family hx of CF or children on oxygen. Now has home O2. Allergy profile was negative for specific IgE elevations. Skin test- POS Grass, Weed, Tree  Asthma History    Initial Asthma Severity Rating:    Age range: 12+ years    Symptoms: daily    Nighttime Awakenings: 0-2/month    Interferes w/ normal activity: some limitations    SABA use (not for EIB): daily    Asthma Severity Assessment: Moderate Persistent   Preventive  Screening-Counseling & Management  Alcohol-Tobacco     Smoking Status: quit     Packs/Day: 1.5     Year Started: 1978     Year Quit: 2008     Pack years: 29  Current Medications (verified): 1)  Mucinex Dm 30-600 Mg  Tb12 (Dextromethorphan-Guaifenesin) .... Take 1-2 Tablets Two Times A Day As Needed 2)  Proair Hfa 108 (90 Base) Mcg/act  Aers (Albuterol Sulfate) .... Inhale 2 Puffs Every 4 Hrs As Needed 3)  Claritin 10 Mg  Tabs (Loratadine) .... Take 1 Tablet By Mouth Once A Day 4)  Symbicort 160-4.5 Mcg/act  Aero (Budesonide-Formoterol Fumarate) .... Inhale 2 Puffs Two Times A Day 5)  Ipratropium-Albuterol 0.5-2.5 (3) Mg/72ml Soln (Ipratropium-Albuterol) .Marland Kitchen.. 1 Vial in Nebulizer Up To Four Times Dailyprn 6)  Prednisone 5 Mg Tabs (Prednisone) .Marland Kitchen.. 1 By Mouth Daily 7)  Robitussin Cough/cold Cf 5-10-100 Mg/54ml Liqd (Phenylephrine-Dm-Gg) .... As Directed On Bottle 8)  Omeprazole 20 Mg Cpdr (Omeprazole) .Marland Kitchen.. 1 By Mouth Once Daily  Allergies (verified): No Known Drug Allergies  Past History:  Past Surgical History: Last updated: 10/01/2010 Resected basal cell cancer face  Family History: Last updated: 06/06/2010 emphysema/COPD - father heart disease - father, pat uncle, MGF rheumatism - mother DM - mother, brother stroke - father  Social History: Last updated: 06/06/2010 Patient states former smoker. quit 2007, 1-1.5ppd  x30years no alcohol married retired: Aeronautical engineer  Risk Factors: Smoking Status: quit (11/28/2010) Packs/Day: 1.5 (11/28/2010)  Past Medical History: COPD (ICD-496) ATYPICAL MYCOBACTERIAL INFECTION (ICD-031.9) BRONCHIECTASIS (ICD-494.0) COUGH (ICD-786.2) ASTHMA (ICD-493.90)-  Allergy Skin testing 11/28/10- Positive. IgE 116. PULMONARY NODULE (ICD-518.89) ALLERGIC RHINITIS CAUSE UNSPECIFIED (ICD-477.9) GERD (ICD-530.81) HYPERTENSION (ICD-401.9)    Review of Systems      See HPI       The patient complains of shortness of breath with activity and  productive cough.  The patient denies shortness of breath at rest, non-productive cough, coughing up blood, chest pain, irregular heartbeats, acid heartburn, indigestion, loss of appetite, weight change, abdominal pain, difficulty swallowing, sore throat, tooth/dental problems, headaches, nasal congestion/difficulty breathing through nose, sneezing, itching, ear ache, rash, change in color of mucus, and fever.    Physical Exam  Additional Exam:  GEN: A/Ox3; pleasant , NAD, muscular build HEENT:  /AT, , EACs-clear, TMs-wnl, NOSE-clear, THROAT-edentulous, Mallampati  II NECK:  Supple w/ fair ROM; no JVD; normal carotid impulses w/o bruits; no thyromegaly or nodules palpated; no lymphadenopathy. RESP  I&W wheeze, L>R, cough CARD:  RRR, no m/r/g   GI:   Soft & nt; nml bowel sounds; no organomegaly or masses detected. Musco: Warm bil,  no calf tenderness edema, clubbing, pulses intact Neuro: intact w/ no focal deficits noted. [place]   Impression & Recommendations:  Problem # 1:  ASTHMA (ICD-493.90) There is a significant IgE Reaginic component based on Skin testing and IgE. It is difficult to tell how much of his chronic lung disease is from allergy vs his significant past smoking hx.  He is describing extremely thick secretions and persistent wheeze. At his request, we are giving neb and depo today and will talk more with him about treatment options. It is late enough today that it will be best to bring him back when there is time for this.   Problem # 2:  BRONCHIECTASIS (ICD-494.0) Significant chronic obstructive airways disease with productive cough.  Serology and skin tests are negative for Aspergillus and the mucus filled "fingerand glove" pattern isn't described on CT, so ABPA seems unlikely.  Other Orders: Est. Patient Level III (96045) Nebulizer Tx (40981) Admin of Therapeutic Inj  intramuscular or subcutaneous (19147) Depo- Medrol 80mg  (J1040)  Patient Instructions: 1)  Please  schedule a follow-up appointment in 1 weeks. 2)  Your allergy testing indicates that allergy is an important part of your breathing problems.  3)  We will talk with you on return about treatment choices.  4)  Neb xop 1.25 and d- 80 given   Medication Administration  Injection # 1:    Medication: Depo- Medrol 80mg     Diagnosis: COUGH (ICD-786.2)    Route: IM    Site: R deltoid    Exp Date: 06-2013    Lot #: [id number]    Mfr: Pharmacia    Patient tolerated injection without complications    Given by: Boone Master CNA/MA (November 28, 2010 5:44 PM)  Orders Added: 1)  Est. Patient Level III [99213] 2)  Nebulizer Tx [82956] 3)  Admin of Therapeutic Inj  intramuscular or subcutaneous [96372] 4)  Depo- Medrol 80mg  [J1040]     Appended Document: Orders Update     Clinical Lists Changes  Orders: Added new Service order of Est. Patient Level III (21308) - Signed Added new Service order of Allergy Puncture Test (65784) - Signed Added new Service order of Allergy I.D Test (69629) - Signed

## 2011-01-22 NOTE — Letter (Signed)
Summary: Device eval form/[facility]  Device eval form/[facility]   Imported By: Lester Woodburn 12/05/2010 12:03:36  _____________________________________________________________________  External Attachment:    Type:   Image     Comment:   External Document

## 2011-01-22 NOTE — Assessment & Plan Note (Signed)
Summary: follow up visit from allergy testing//kcw   Copy to:  Juanito Doom Primary Smantha Boakye/Referring Sajad Glander:  Lysbeth Galas, [facility] [place]  CC:  Follow up visit after Allergy test on Friday.Craig Arnold  History of Present Illness: History of Present Illness: History of Present Illness: October 01, 2010- 54 yoM followed by Dr Lysbeth Galas for primary care and coming today w/ his grandson on referral by [name] for allergy evaluation. He has hx of bronchiectasis, reactive airways, treated MAIC, acute sinusitis (CT sinus 2008). Recently IgE elevated 116.5 without specific antibodies elevated on allergy profile, Eos 12.2%. He has been on maintnenance prednisone at 5 mg every other day, currently off x 5 days. He says his main problem is cough with difficulty clearing mucus from his chest and heasy DOE. 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 This is a dusty process. Quit smoking 2008. Recent office notes and labs reviewed.   November 28, 2010- Bronchiectasis, hx MAIC, sinusitis,  Coughing up thick mucus "like jello". Denies fever. Occasional sneeze.  Here today for allergy testing. IgE was 116. Brother has COPD- smokes. Denies family hx of CF or children on oxygen. Now has home O2. Allergy profile was negative for specific IgE elevations. Skin test- POS Carlean Purl, Tree  December 01, 2010-  Bronchiectasis, hx MAIC, sinusitis,  Nurse-CC: Follow up visit after Allergy test on Friday. Comes to review allergy testing results and make treatment choices.  We discussed allergy vaccine and after full discussion of risk and goals including anaphyllaxis, he wants to try. We also discussed a trial of Daliresp.    Asthma History    Asthma Control Assessment:    Age range: 12+ years    Symptoms: >2 days/week    Nighttime Awakenings: 0-2/month    Interferes w/ normal activity: some limitations    SABA use (not for EIB):  >2 days/week    Asthma Control Assessment: Not Well Controlled   Preventive Screening-Counseling & Management  Alcohol-Tobacco     Smoking Status: quit     Packs/Day: 1.5     Year Started: 1978     Year Quit: 2008     Pack years: 73  Current Medications (verified): 1)  Mucinex Dm 30-600 Mg  Tb12 (Dextromethorphan-Guaifenesin) .... Take 1-2 Tablets Two Times A Day As Needed 2)  Proair Hfa 108 (90 Base) Mcg/act  Aers (Albuterol Sulfate) .... Inhale 2 Puffs Every 4 Hrs As Needed 3)  Claritin 10 Mg  Tabs (Loratadine) .... Take 1 Tablet By Mouth Once A Day 4)  Symbicort 160-4.5 Mcg/act  Aero (Budesonide-Formoterol Fumarate) .... Inhale 2 Puffs Two Times A Day 5)  Ipratropium-Albuterol 0.5-2.5 (3) Mg/60ml Soln (Ipratropium-Albuterol) .Craig Arnold.. 1 Vial in Nebulizer Up To Four Times Dailyprn 6)  Prednisone 5 Mg Tabs (Prednisone) .Craig Arnold.. 1 By Mouth Daily 7)  Robitussin Cough/cold Cf 5-10-100 Mg/94ml Liqd (Phenylephrine-Dm-Gg) .... As Directed On Bottle 8)  Omeprazole 20 Mg Cpdr (Omeprazole) .Craig Arnold.. 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 IgE 116. PULMONARY NODULE (ICD-518.89) ALLERGIC RHINITIS CAUSE UNSPECIFIED (ICD-477.9) GERD (ICD-530.81) HYPERTENSION (ICD-401.9)    Past Surgical History: Last updated: 10/01/2010 Resected basal cell cancer face  Family History: Last updated: 06/06/2010 emphysema/COPD - father heart disease - father, pat uncle,  MGF rheumatism - mother DM - mother, brother stroke - father  Social History: Last updated: 06/06/2010 Patient states former smoker. quit 2007, 1-1.5ppd x30years no alcohol married retired: Aeronautical engineer  Risk Factors: Smoking Status: quit (12/01/2010) Packs/Day: 1.5  (12/01/2010)  Review of Systems      See HPI       The patient complains of shortness of breath with activity, productive cough, and non-productive cough.  The patient denies shortness of breath at rest, irregular heartbeats, acid heartburn, indigestion, loss of appetite, weight change, abdominal pain, difficulty swallowing, sore throat, tooth/dental problems, headaches, nasal congestion/difficulty breathing through nose, and sneezing.    Vital Signs:  Patient profile:   55 year old male Height:      73 inches Weight:      235.50 pounds BMI:     31.18 O2 Sat:      92 % on Room air Pulse rate:   99 / minute BP sitting:   136 / 84  (left arm) Cuff size:   large  Vitals Entered By: Reynaldo Minium CMA (December 01, 2010 9:24 AM)  O2 Flow:  Room air CC: Follow up visit after Allergy test on Friday.   Physical Exam  Additional Exam:  GEN: A/Ox3; pleasant , NAD, muscular build HEENT:  [place]/AT, , EACs-clear, TMs-wnl, NOSE-clear, THROAT-edentulous, Mallampati  II NECK:  Supple w/ fair ROM; no JVD; normal carotid impulses w/o bruits; no thyromegaly or nodules palpated; no lymphadenopathy. RESP  I&W wheeze, L>R, cough CARD:  RRR, no m/r/g   GI:   Soft & nt; nml bowel sounds; no organomegaly or masses detected. Musco: Warm bil,  no calf tenderness edema, clubbing, pulses intact Neuro: intact w/ no focal deficits noted. Stammer   Impression & Recommendations:  Problem # 1:  BRONCHIECTASIS (ICD-494.0) We discussed and will try Daliresp to head off bronchitic exacerbations. We talked carefully about common side effects, especially GI.  Problem # 2:  ASTHMA (ICD-493.90) We discussed Xolair and allergy vaccine, with decision to start with a trial of allergy vaccine.   Medications Added to Medication List This Visit: 1)  Daliresp 500 Mcg Tabs (Roflumilast) .Craig Arnold.. 1 daily after meal  Other Orders: Est. Patient Level III (16109)  Patient Instructions: 1)  Please schedule a follow-up  appointment in 2 months. 2)  We will start allergy vaccine and the allergy lab will call you when allergy shots are ready to start.  3)  Try sample/ script  Daliresp 500 mg, 1 daily after a meal. See if it gradually improves your cough and congestion.  Prescriptions: DALIRESP 500 MCG TABS (ROFLUMILAST) 1 daily after meal  #30 x prn   Entered and Authorized by:   Waymon Budge MD   Signed by:   Waymon Budge MD on 12/01/2010   Method used:   Print then Give to Patient   RxID:   [id number]     Appended Document: Orders Update Medications Added * ALLERGY VACCINE GH New start          Clinical Lists Changes  Medications: Added new medication of * ALLERGY VACCINE GH New start

## 2011-01-22 NOTE — Miscellaneous (Signed)
Summary: Vaccine Rx  Vaccine Rx   Imported By: Lester Parrott 01/02/2011 11:19:38  _____________________________________________________________________  External Attachment:    Type:   Image     Comment:   External Document

## 2011-01-22 NOTE — Miscellaneous (Signed)
Summary: Allergy Skin Test / [facility]  Allergy Skin Test / [facility]   Imported By: Lennie Odor 12/04/2010 12:07:37  _____________________________________________________________________  External Attachment:    Type:   Image     Comment:   External Document

## 2011-01-22 NOTE — Progress Notes (Signed)
  Phone Note Other Incoming   Request: Send information Summary of Call: Request for records received from [facility]. Request forwarded to [facility].      Appended Document:  [facility] request received sent to [facility]  

## 2011-01-22 NOTE — Medication Information (Signed)
Summary: [facility]   Imported By: Lester Sunburg 01/15/2011 10:22:47  _____________________________________________________________________  External Attachment:    Type:   Image     Comment:   External Document

## 2011-01-22 NOTE — Assessment & Plan Note (Signed)
Summary: asthma, bronchiectasis, COPD   Visit Type:  Follow-up Copy to:  Juanito Doom Primary Provider/Referring Provider:  [place], [facility] [place]  CC:  2 month followup COPD, hypoxemia, and asthma.  He states that his breathing is the same.  He is still c/o prod cough- clear to yellow sputum.  He also has still been wheezing with exertion.Marland Kitchen  History of Present Illness: Mr. Mcbroom is a 55 year old man with COPD, chronic cough with chronic postnasal drip, and bronchodilator responsiveness.  Also has documented [facility], completed  therapy (3/09 - 10/09). Follow up sputum negative. Repeat 12/09 showed AFB negative, fungal negative normal flora. Have considered him for Xolair but IgE and eosinophils low in the past.   CT scan of the chest 10/09 showed improvement in nodular infiltrates. Then 12/09 and 08/2010 repeat CTs showed stable mild bronchiectasis and emphysematous changes.  Repeat sputum 12/10 showed AFB negative, fungal negative, normal flora  ROV 07/30/10 -- Hx bronchiectasis and asthma. Was seen in 6/11 for worsening cough, wheeze. Rx for exac with increased pred and Augmentin. Improved but still with cough and wheeze. Still on Pred 5 every other day ROV 08/29/10 -- returns for asthma, bronchiectasis, dyspnea. Also w hx MAIC.   ROV 10/17/10 -- Hx as above, on every other day pred 5mg . Since last visit seen by [name] re: allergic component to asthma control. His serum eval has been negative, but I want hime to get skin testing. Also has had CT scan that is stable as detailed below. Still with every day wheeze, daily cough prod of thick whitish mucous. Uses ProAir two times a day.  Skin test- POS Grass, Weed, Tree  ROV 12/18/10 -- COPD/asthma, dififcult to control. Has had skin testing w [name], about to start immunotherapy. Currently on Pred 5mg  every other day. He tried Daliresp x 3 days, but lost all appetite, so he stopped it. Unclear whether it helped. Still on Symbicort two times a  day, uses DuoNebs 2 -3 x a day.     Current Medications (verified): 1)  Mucinex Dm 30-600 Mg  Tb12 (Dextromethorphan-Guaifenesin) .... Take 1-2 Tablets Two Times A Day As Needed 2)  Proair Hfa 108 (90 Base) Mcg/act  Aers (Albuterol Sulfate) .... Inhale 2 Puffs Every 4 Hrs As Needed 3)  Claritin 10 Mg  Tabs (Loratadine) .... Take 1 Tablet By Mouth Once A Day 4)  Symbicort 160-4.5 Mcg/act  Aero (Budesonide-Formoterol Fumarate) .... Inhale 2 Puffs Two Times A Day 5)  Ipratropium-Albuterol 0.5-2.5 (3) Mg/68ml Soln (Ipratropium-Albuterol) .Marland Kitchen.. 1 Vial in Nebulizer Up To Four Times Dailyprn 6)  Prednisone 5 Mg Tabs (Prednisone) .Marland Kitchen.. 1 By Mouth Daily 7)  Robitussin Cough/cold Cf 5-10-100 Mg/79ml Liqd (Phenylephrine-Dm-Gg) .... As Directed On Bottle 8)  Omeprazole 20 Mg Cpdr (Omeprazole) .Marland Kitchen.. 1 By Mouth Once Daily 9)  Allergy Vaccine Gh .... New Start 10)  Oxygen 2 Liters .... With Exertion Only  Allergies (verified): No Known Drug Allergies  Vital Signs:  Patient profile:   55 year old male Weight:      231 pounds O2 Sat:      95 % on Room air Temp:     97.6 degrees F oral Pulse rate:   92 / minute BP sitting:   140 / 80  (left arm)  Vitals Entered By: Vernie Murders (December 18, 2010 1:26 PM)  O2 Flow:  Room air CC: 2 month followup COPD, hypoxemia, asthma.  He states that his breathing is the same.  He  is still c/o prod cough- clear to yellow sputum.  He also has still been wheezing with exertion.   Physical Exam  General:  normal appearance, healthy appearing, and thin.   Head:  normocephalic and atraumatic Nose:  no deformity, discharge, inflammation, or lesions Mouth:  no deformity or lesions Neck:  no masses, thyromegaly, or abnormal cervical nodes Chest Wall:  no deformities noted Lungs:  difuse soft bilateral wheezing Heart:  regular rate and rhythm, S1, S2 without murmurs, rubs, gallops, or clicks Abdomen:  not examined Msk:  no deformity or scoliosis noted with normal  posture Extremities:  no clubbing, cyanosis, edema, or deformity noted Neurologic:  nonfocal Skin:  intact without lesions or rashes Psych:  alert and cooperative; normal mood and affect; normal attention span and concentration   Impression & Recommendations:  Problem # 1:  ASTHMA (ICD-493.90) That has been persistant and difficult to control. Looks like an allergic component, hopefully he will red[] spond to immunotherapy. has never been clear to me whether we should try Xolair since his IgE was normal, but this may be an option also  Problem # 2:  COPD (ICD-496)  Problem # 3:  BRONCHIECTASIS (ICD-494.0)  Problem # 4:  ALLERGIC RHINITIS CAUSE UNSPECIFIED (ICD-477.9)  His updated medication list for this problem includes:    Claritin 10 Mg Tabs (Loratadine) .Marland Kitchen... Take 1 tablet by mouth once a day  Orders: Est. Patient Level IV (09811)  Medications Added to Medication List This Visit: 1)  Oxygen 2 Liters  .... With exertion only  Patient Instructions: 1)  Start your allergy shots next week as planned, then follow up with [name] as planned.  2)  Continue your Symbicort and ProAir as you are taking them.  3)  Continue claritin 4)  Continue your prednisone 5mg  every other day  5)  Follow up with [name] in 3 months or as needed

## 2011-01-23 DIAGNOSIS — J301 Allergic rhinitis due to pollen: Secondary | ICD-10-CM

## 2011-01-26 DIAGNOSIS — J301 Allergic rhinitis due to pollen: Secondary | ICD-10-CM

## 2011-01-30 ENCOUNTER — Ambulatory Visit (INDEPENDENT_AMBULATORY_CARE_PROVIDER_SITE_OTHER): Payer: 59 | Admitting: Internal Medicine

## 2011-01-30 ENCOUNTER — Encounter: Payer: Self-pay | Admitting: Internal Medicine

## 2011-01-30 DIAGNOSIS — J479 Bronchiectasis, uncomplicated: Secondary | ICD-10-CM

## 2011-01-30 DIAGNOSIS — J45909 Unspecified asthma, uncomplicated: Secondary | ICD-10-CM

## 2011-01-30 DIAGNOSIS — J309 Allergic rhinitis, unspecified: Secondary | ICD-10-CM

## 2011-01-30 DIAGNOSIS — J301 Allergic rhinitis due to pollen: Secondary | ICD-10-CM

## 2011-02-03 ENCOUNTER — Ambulatory Visit (INDEPENDENT_AMBULATORY_CARE_PROVIDER_SITE_OTHER): Payer: 59

## 2011-02-03 DIAGNOSIS — J301 Allergic rhinitis due to pollen: Secondary | ICD-10-CM

## 2011-02-05 NOTE — Assessment & Plan Note (Signed)
Summary: 2 month rov/mhh   Copy to:  Juanito Doom Primary Provider/Referring Provider:  [place] [place]  CC:  2 month follow up visit-allergies; increased thick congestion-mucinex not helping.Marland Kitchen  History of Present Illness: ROV 07/30/10 -- Hx bronchiectasis and asthma. Was seen in 6/11 for worsening cough, wheeze. Rx for exac with increased pred and Augmentin. Improved but still with cough and wheeze. Still on Pred 5 every other day ROV 08/29/10 -- returns for asthma, bronchiectasis, dyspnea. Also w hx MAIC.   ROV 10/17/10 -- Hx as above, on every other day pred 5mg . Since last visit seen by [name] re: allergic component to asthma control. His serum eval has been negative, but I want hime to get skin testing. Also has had CT scan that is stable as detailed below. Still with every day wheeze, daily cough prod of thick whitish mucous. Uses ProAir two times a day.  Skin test- POS Grass, Weed, Tree  ROV 12/18/10 -- COPD/asthma, dififcult to control. Has had skin testing w [name], about to start immunotherapy. Currently on Pred 5mg  every other day. He tried Daliresp x 3 days, but lost all appetite, so he stopped it. Unclear whether it helped. Still on Symbicort two times a day, uses DuoNebs 2 -3 x a day.  December 01, 2010-  Bronchiectasis, hx MAIC, sinusitis,  Nurse-CC: Follow up visit after Allergy test on Friday. Comes to review allergy testing results and make treatment choices.  We discussed allergy vaccine and after full discussion of risk and goals including anaphyllaxis, he wants to try. We also discussed a trial of Daliresp.   January 30, 2011- -  Bronchiectasis, hx MAIC, sinusitis..................grandson here Nurse-CC: 2 month follow up visit-allergies; increased thick congestion-mucinex not helping. Continues building vaccine w/o problems. Noting cough and thick white mucus, occ cough till retch and can't sleep.Denies fever, blood, purulent, chest pain, N&V. He heard  about Zpak at barber shop and wants to try.     Asthma History    Asthma Control Assessment:    Age range: 12+ years    Symptoms: >2 days/week    Nighttime Awakenings: 1-3/week    Interferes w/ normal activity: some limitations    SABA use (not for EIB): >2 days/week    Asthma Control Assessment: Not Well Controlled   Preventive Screening-Counseling & Management  Alcohol-Tobacco     Smoking Status: quit     Packs/Day: 1.5     Year Started: 1978     Year Quit: 2008     Pack years: 65  Current Medications (verified): 1)  Mucinex Dm 30-600 Mg  Tb12 (Dextromethorphan-Guaifenesin) .... Take 1-2 Tablets Two Times A Day As Needed 2)  Proair Hfa 108 (90 Base) Mcg/act  Aers (Albuterol Sulfate) .... Inhale 2 Puffs Every 4 Hrs As Needed 3)  Claritin 10 Mg  Tabs (Loratadine) .... Take 1 Tablet By Mouth Once A Day 4)  Symbicort 160-4.5 Mcg/act  Aero (Budesonide-Formoterol Fumarate) .... Inhale 2 Puffs Two Times A Day 5)  Ipratropium-Albuterol 0.5-2.5 (3) Mg/81ml Soln (Ipratropium-Albuterol) .Marland Kitchen.. 1 Vial in Nebulizer Up To Four Times Dailyprn 6)  Prednisone 5 Mg Tabs (Prednisone) .Marland Kitchen.. 1 By Mouth Daily 7)  Robitussin Cough/cold Cf 5-10-100 Mg/9ml Liqd (Phenylephrine-Dm-Gg) .... As Directed On Bottle 8)  Omeprazole 20 Mg Cpdr (Omeprazole) .Marland Kitchen.. 1 By Mouth Once Daily 9)  Allergy Vaccine Gh .... New Start 10)  Oxygen 2 Liters .... With Exertion Only  Allergies (verified): No Known Drug Allergies  Past History:  Past Medical History: Last updated: 11/28/2010 COPD (ICD-496) ATYPICAL MYCOBACTERIAL INFECTION (ICD-031.9) BRONCHIECTASIS (ICD-494.0) COUGH (ICD-786.2) ASTHMA (ICD-493.90)-  Allergy Skin testing 11/28/10- Positive. IgE 116. PULMONARY NODULE (ICD-518.89) ALLERGIC RHINITIS CAUSE UNSPECIFIED (ICD-477.9) GERD (ICD-530.81) HYPERTENSION (ICD-401.9)    Past Surgical History: Last updated: 10/01/2010 Resected basal cell cancer face  Family History: Last updated:  06/06/2010 emphysema/COPD - father heart disease - father, pat uncle, MGF rheumatism - mother DM - mother, brother stroke - father  Social History: Last updated: 06/06/2010 Patient states former smoker. quit 2007, 1-1.5ppd x30years no alcohol married retired: Aeronautical engineer  Risk Factors: Smoking Status: quit (01/30/2011) Packs/Day: 1.5 (01/30/2011)  Vital Signs:  Patient profile:   55 year old male Height:      73 inches Weight:      228.50 pounds BMI:     30.26 O2 Sat:      90 % on Room air Pulse rate:   95 / minute BP sitting:   136 / 84  (right arm) Cuff size:   regular  Vitals Entered By: Reynaldo Minium CMA (January 30, 2011 9:53 AM)  O2 Flow:  Room air CC: 2 month follow up visit-allergies; increased thick congestion-mucinex not helping.   Physical Exam  Additional Exam:  GEN: A/Ox3; pleasant , NAD, muscular build HEENT:  [place]/AT, , EACs-clear, TMs-wnl, NOSE-clear, THROAT-edentulous, Mallampati  II NECK:  Supple w/ fair ROM; no JVD; normal carotid impulses w/o bruits; no thyromegaly or nodules palpated; no lymphadenopathy. RESP Diffuse musical wheeze, unlabored at rest w/o cough or rhonchi. CARD:  RRR, no m/r/g   GI:   Soft & nt; nml bowel sounds; no organomegaly or masses detected. Musco: Warm bil,  no calf tenderness edema, clubbing, pulses intact Neuro: intact w/ no focal deficits noted. Stammer   Impression & Recommendations:  Problem # 1:  ASTHMA (ICD-493.90) Chronic obstructive asthma. We will continue allergy vaccine build-up and watch to see how he does throught the Spring. We won't see real progress probably until summer at the earliest.  For today, since he is curious about Z pak, we will let him try for chronic bronchitis component.  He has little potential for response to bronchodilator.  Problem # 2:  BRONCHIECTASIS (ICD-494.0) Choronic. Watch for recurrent MAIC.  Medications Added to Medication List This Visit: 1)  Zithromax Z-pak 250 Mg Tabs  (Azithromycin) .... 2 today then one daily  Other Orders: Est. Patient Level III (78295) Prescription Created Electronically [phone])  Patient Instructions: 1)  Please schedule a follow-up appointment in 4 months. 2)  Continue building allergy vaccine 3)  Script for Z pak sent to drug store Prescriptions: ZITHROMAX Z-PAK 250 MG TABS (AZITHROMYCIN) 2 today then one daily  #1 x 1   Entered and Authorized by:   Waymon Budge MD   Signed by:   Waymon Budge MD on 01/30/2011   Method used:   Electronically to        [facility] [phone]* (retail)       [address]       [place], [place]  [address]       Ph: [phone] or [phone]       Fax: [phone]   RxID:   [id number]

## 2011-02-06 ENCOUNTER — Encounter: Payer: Self-pay | Admitting: Internal Medicine

## 2011-02-06 ENCOUNTER — Other Ambulatory Visit: Payer: Self-pay | Admitting: Emergency Medicine

## 2011-02-06 ENCOUNTER — Encounter: Payer: Self-pay | Admitting: Adult Health

## 2011-02-06 ENCOUNTER — Ambulatory Visit (INDEPENDENT_AMBULATORY_CARE_PROVIDER_SITE_OTHER)
Admission: RE | Admit: 2011-02-06 | Discharge: 2011-02-06 | Disposition: A | Discharge status: Home / Self Care | Payer: 59 | Source: Ambulatory Visit | Attending: Emergency Medicine | Admitting: Emergency Medicine

## 2011-02-06 ENCOUNTER — Ambulatory Visit (INDEPENDENT_AMBULATORY_CARE_PROVIDER_SITE_OTHER): Payer: 59 | Admitting: Adult Health

## 2011-02-06 ENCOUNTER — Ambulatory Visit (INDEPENDENT_AMBULATORY_CARE_PROVIDER_SITE_OTHER): Payer: 59

## 2011-02-06 DIAGNOSIS — J449 Chronic obstructive pulmonary disease, unspecified: Secondary | ICD-10-CM

## 2011-02-06 DIAGNOSIS — J301 Allergic rhinitis due to pollen: Secondary | ICD-10-CM

## 2011-02-10 ENCOUNTER — Ambulatory Visit (INDEPENDENT_AMBULATORY_CARE_PROVIDER_SITE_OTHER): Payer: 59

## 2011-02-10 DIAGNOSIS — J301 Allergic rhinitis due to pollen: Secondary | ICD-10-CM

## 2011-02-11 ENCOUNTER — Ambulatory Visit (INDEPENDENT_AMBULATORY_CARE_PROVIDER_SITE_OTHER): Payer: 59

## 2011-02-11 DIAGNOSIS — J301 Allergic rhinitis due to pollen: Secondary | ICD-10-CM

## 2011-02-13 ENCOUNTER — Ambulatory Visit (INDEPENDENT_AMBULATORY_CARE_PROVIDER_SITE_OTHER): Payer: 59

## 2011-02-13 DIAGNOSIS — J301 Allergic rhinitis due to pollen: Secondary | ICD-10-CM

## 2011-02-17 ENCOUNTER — Ambulatory Visit (INDEPENDENT_AMBULATORY_CARE_PROVIDER_SITE_OTHER): Payer: 59

## 2011-02-17 DIAGNOSIS — J301 Allergic rhinitis due to pollen: Secondary | ICD-10-CM

## 2011-02-17 NOTE — Miscellaneous (Signed)
Summary: Injection Record / [facility]    Injection Record / [facility]    Imported By: Lennie Odor 02/13/2011 12:29:16  _____________________________________________________________________  External Attachment:    Type:   Image     Comment:   External Document

## 2011-02-17 NOTE — Assessment & Plan Note (Signed)
Summary: Acute NP office visit - COPD   Copy to:  Juanito Doom Primary Provider/Referring Provider:  [facility] [place]  CC:  wheezing, rattling in chest, DOE, and prod cough with white/yellow/green mucus x3days - denies f/c/s.  was given zpak at 2.10.12 ov > states didn't help.  History of Present Illness: ROV 07/30/10 -- Hx bronchiectasis and asthma. Was seen in 6/11 for worsening cough, wheeze. Rx for exac with increased pred and Augmentin. Improved but still with cough and wheeze. Still on Pred 5 every other day ROV 08/29/10 -- returns for asthma, bronchiectasis, dyspnea. Also w hx MAIC.   ROV 10/17/10 -- Hx as above, on every other day pred 5mg . Since last visit seen by [name] re: allergic component to asthma control. His serum eval has been negative, but I want hime to get skin testing. Also has had CT scan that is stable as detailed below. Still with every day wheeze, daily cough prod of thick whitish mucous. Uses ProAir two times a day.  Skin test- POS Grass, Weed, Tree  ROV 12/18/10 -- COPD/asthma, dififcult to control. Has had skin testing w [name], about to start immunotherapy. Currently on Pred 5mg  every other day. He tried Daliresp x 3 days, but lost all appetite, so he stopped it. Unclear whether it helped. Still on Symbicort two times a day, uses DuoNebs 2 -3 x a day.  December 01, 2010-  Bronchiectasis, hx MAIC, sinusitis,  Nurse-CC: Follow up visit after Allergy test on Friday. Comes to review allergy testing results and make treatment choices.  We discussed allergy vaccine and after full discussion of risk and goals including anaphyllaxis, he wants to try. We also discussed a trial of Daliresp.   January 30, 2011- -  Bronchiectasis, hx MAIC, sinusitis..................grandson here Nurse-CC: 2 month follow up visit-allergies; increased thick congestion-mucinex not helping. Continues building vaccine w/o problems. Noting cough and thick white mucus, occ cough  till retch and can't sleep.Denies fever, blood, purulent, chest pain, N&V. He heard about Zpak at barber shop and wants to try.    February 06, 2011 --Presents for an acute office visit.Compalins of wheezing, rattling in chest, DOE, prod cough with white/yellow/green mucus x3days Was seen 1 week ago and given zpak that he states did not help. Cough and wheezig worse this am.Denies chest pain,  orthopnea, hemoptysis, fever, n/v/d, edema, headache. CXR today with no acute process. Mucinex not helping. Worse cough at night. Wears out walking.   Medications Prior to Update: 1)  Mucinex Dm 30-600 Mg  Tb12 (Dextromethorphan-Guaifenesin) .... Take 1-2 Tablets Two Times A Day As Needed 2)  Proair Hfa 108 (90 Base) Mcg/act  Aers (Albuterol Sulfate) .... Inhale 2 Puffs Every 4 Hrs As Needed 3)  Claritin 10 Mg  Tabs (Loratadine) .... Take 1 Tablet By Mouth Once A Day 4)  Symbicort 160-4.5 Mcg/act  Aero (Budesonide-Formoterol Fumarate) .... Inhale 2 Puffs Two Times A Day 5)  Ipratropium-Albuterol 0.5-2.5 (3) Mg/29ml Soln (Ipratropium-Albuterol) .Marland Kitchen.. 1 Vial in Nebulizer Up To Four Times Dailyprn 6)  Prednisone 5 Mg Tabs (Prednisone) .Marland Kitchen.. 1 By Mouth Daily 7)  Robitussin Cough/cold Cf 5-10-100 Mg/59ml Liqd (Phenylephrine-Dm-Gg) .... As Directed On Bottle 8)  Omeprazole 20 Mg Cpdr (Omeprazole) .Marland Kitchen.. 1 By Mouth Once Daily 9)  Allergy Vaccine Gh .... New Start 10)  Oxygen 2 Liters .... With Exertion Only 11)  Zithromax Z-Pak 250 Mg Tabs (Azithromycin) .... 2 Today Then One Daily  Current Medications (verified): 1)  Mucinex Dm  30-600 Mg  Tb12 (Dextromethorphan-Guaifenesin) .... Take 1-2 Tablets Two Times A Day As Needed 2)  Proair Hfa 108 (90 Base) Mcg/act  Aers (Albuterol Sulfate) .... Inhale 2 Puffs Every 4 Hrs As Needed 3)  Claritin 10 Mg  Tabs (Loratadine) .... Take 1 Tablet By Mouth Once A Day 4)  Symbicort 160-4.5 Mcg/act  Aero (Budesonide-Formoterol Fumarate) .... Inhale 2 Puffs Two Times A Day 5)   Ipratropium-Albuterol 0.5-2.5 (3) Mg/79ml Soln (Ipratropium-Albuterol) .Marland Kitchen.. 1 Vial in Nebulizer Up To Four Times Dailyprn 6)  Prednisone 5 Mg Tabs (Prednisone) .Marland Kitchen.. 1 By Mouth Daily 7)  Robitussin Cough/cold Cf 5-10-100 Mg/35ml Liqd (Phenylephrine-Dm-Gg) .... As Directed On Bottle 8)  Omeprazole 20 Mg Cpdr (Omeprazole) .Marland Kitchen.. 1 By Mouth Once Daily 9)  Allergy Vaccine Gh .... New Start 10)  Oxygen 2 Liters .... With Exertion Only 11)  Zithromax Z-Pak 250 Mg Tabs (Azithromycin) .... 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 IgE 116. PULMONARY NODULE (ICD-518.89) ALLERGIC RHINITIS CAUSE UNSPECIFIED (ICD-477.9) GERD (ICD-530.81) HYPERTENSION (ICD-401.9)    Past Surgical History: Last updated: 10/01/2010 Resected basal cell cancer face  Family History: Last updated: 06/06/2010 emphysema/COPD - father heart disease - father, pat uncle, MGF rheumatism - mother DM - mother, brother stroke - father  Social History: Last updated: 06/06/2010 Patient states former smoker. quit 2007, 1-1.5ppd x30years no alcohol married retired: Aeronautical engineer  Risk Factors: Smoking Status: quit (01/30/2011) Packs/Day: 1.5 (01/30/2011)  Review of Systems      See HPI  Vital Signs:  Patient profile:   55 year old male Height:      73 inches Weight:      227.38 pounds BMI:     30.11 O2 Sat:      91 % on Room air Temp:     98.2 degrees F oral Pulse rate:   106 / minute BP sitting:   126 / 84  (right arm) Cuff size:   regular  Vitals Entered By: Boone Master CNA/MA (February 06, 2011 10:08 AM)  O2 Flow:  Room air CC: wheezing, rattling in chest, DOE, prod cough with white/yellow/green mucus x3days - denies f/c/s.  was given zpak at  2.10.12 ov > states didn't help Is Patient Diabetic? No Comments Medications reviewed with patient Daytime contact number verified with patient. Boone Master CNA/MA  February 06, 2011 10:08 AM    Physical Exam  Additional Exam:  GEN: A/Ox3; pleasant , NAD, HEENT:  [place]/AT, , EACs-clear, TMs-wnl, NOSE-clear, THROAT-edentulous, Mallampati  II NECK:  Supple w/ fair ROM; no JVD; normal carotid impulses w/o bruits; no thyromegaly or nodules palpated; no lymphadenopathy. RESP Coarse BS w/ exp wheeizng  CARD:  RRR, no m/r/g   GI:   Soft & nt; nml bowel sounds; no organomegaly or masses detected. [place]: Warm bil,  no calf tenderness edema, clubbing, pulses intact Neuro: intact w/ no focal deficits noted.    Impression & Recommendations:  Problem # 1:  COPD (ICD-496) Exacerbation slow to resolve.cxr today with no acute process Plan: Avelox 400mg  by mouth once daily  Depo Medrol 120mg  IM  Mucinex DM two times a day as needed cough/congestion Prednisone taper 40mg  once daily x 4 days, 30mg  once daily x 4 day, 30mg  x 4d, 20mg  once daily x 4 days, 10mg  once daily x 4 days, then back  to 5mg  once daily  Please contact office for sooner follow up if symptoms do not improve or worsen   Medications Added to Medication List This Visit: 1)  Prednisone 10 Mg Tabs (Prednisone) .... 4 tabs for 4 days, then 3 tabs for 4 days, 2 tabs for 4 days, then 1 tab for 4 days, then back to 5mg  once daily 2)  Avelox 400 Mg Tabs (Moxifloxacin hcl) .Marland Kitchen.. 1 by mouth once daily  Other Orders: T-2 View CXR (71020TC) Depo- Medrol 40mg  (J1030) Depo- Medrol 80mg  (J1040) Admin of Therapeutic Inj  intramuscular or subcutaneous (10272) Nebulizer Tx (53664) Albuterol Sulfate Sol 1mg  unit dose (Q0347) Est. Patient Level IV (42595)  Patient Instructions: 1)  Avelox 400mg  by mouth once daily  2)  Mucinex DM two times a day as needed cough/congestion 3)  Prednisone taper 40mg  once daily x 4 days, 30mg  once daily x 4 day,  30mg  x 4d, 20mg  once daily x 4 days, 10mg  once daily x 4 days, then back to 5mg  once daily  4)  Please contact office for sooner follow up if symptoms do not improve or worsen  Prescriptions: AVELOX 400 MG TABS (MOXIFLOXACIN HCL) 1 by mouth once daily  #5 x 0   Entered and Authorized by:   Rubye Oaks NP   Signed by:   Knoxx Boeding NP on 02/06/2011   Method used:   Electronically to        [facility] [phone]* (retail)       [address]       [place], [place]  [address]       Ph: [phone] or [phone]       Fax: [phone]   RxID:   [id number] PREDNISONE 10 MG TABS (PREDNISONE) 4 tabs for 4 days, then 3 tabs for 4 days, 2 tabs for 4 days, then 1 tab for 4 days, then back to 5mg  once daily  #45 x 0   Entered and Authorized by:   Rubye Oaks NP   Signed by:   Rubye Oaks NP on 02/06/2011   Method used:   [facility]ically to        [facility] [phone][facility] (retail)       [address]       [place], [place]  [address]       Ph: [phone] or [phone]       Fax: [phone]   RxID:   [id number]    Medication Administration  Injection # 1:    Medication: Depo- Medrol 40mg     Diagnosis: COPD (ICD-496)    Route: IM    Site: RUOQ gluteus    Exp Date: 06-2013    Lot #: [id number]    Mfr: Pharmacia    Patient tolerated injection without complications    Given by: Boone Master CNA/MA (February 06, 2011 12:20 PM)  Injection # 2:    Medication: Depo- Medrol 80mg     Diagnosis: COPD (ICD-496)    Route: IM    Site: RUOQ gluteus    Exp Date: 06-2013    Lot #: [id number]    Mfr: Pharmacia    Patient tolerated injection without complications    Given by: Boone Master CNA/MA (February 06, 2011 12:20 PM)  Medication # 1:    Medication: Albuterol Sulfate Sol 1mg  unit dose    Diagnosis: COPD (ICD-496)  Dose: 1 vial    Route: inhaled    Exp Date: 12-2011    Lot #: [id number]    Mfr: nephron    Patient  tolerated medication without complications    Given by: Boone Master CNA/MA (February 06, 2011 12:20 PM)  Orders Added: 1)  T-2 View CXR [71020TC] 2)  Depo- Medrol 40mg  [J1030] 3)  Depo- Medrol 80mg  [J1040] 4)  Admin of Therapeutic Inj  intramuscular or subcutaneous [96372] 5)  Nebulizer Tx [94640] 6)  Albuterol Sulfate Sol 1mg  unit dose [J7613] 7)  Est. Patient Level IV [04540]  Appended Document: Acute NP office visit - COPD has follow up ov with dr byrum in 4 weeks and as needed  Please contact office for sooner follow up if symptoms do not improve or worsen

## 2011-02-20 ENCOUNTER — Encounter: Payer: Self-pay | Admitting: Internal Medicine

## 2011-02-20 ENCOUNTER — Ambulatory Visit (INDEPENDENT_AMBULATORY_CARE_PROVIDER_SITE_OTHER): Payer: 59

## 2011-02-20 DIAGNOSIS — J301 Allergic rhinitis due to pollen: Secondary | ICD-10-CM | POA: Insufficient documentation

## 2011-02-24 ENCOUNTER — Ambulatory Visit: Payer: 59

## 2011-02-24 ENCOUNTER — Ambulatory Visit (INDEPENDENT_AMBULATORY_CARE_PROVIDER_SITE_OTHER): Payer: 59

## 2011-02-24 ENCOUNTER — Encounter: Payer: Self-pay | Admitting: Internal Medicine

## 2011-02-24 DIAGNOSIS — J301 Allergic rhinitis due to pollen: Secondary | ICD-10-CM

## 2011-02-26 NOTE — Assessment & Plan Note (Signed)
Summary: ALLERGY/CB   Nurse Visit   Allergies: No Known Drug Allergies  Orders Added: 1)  Allergy Injection (1) [95115] 

## 2011-02-27 ENCOUNTER — Ambulatory Visit (INDEPENDENT_AMBULATORY_CARE_PROVIDER_SITE_OTHER): Payer: 59

## 2011-02-27 ENCOUNTER — Encounter: Payer: Self-pay | Admitting: Internal Medicine

## 2011-02-27 DIAGNOSIS — J301 Allergic rhinitis due to pollen: Secondary | ICD-10-CM

## 2011-03-02 ENCOUNTER — Encounter: Payer: Self-pay | Admitting: Internal Medicine

## 2011-03-02 ENCOUNTER — Ambulatory Visit (INDEPENDENT_AMBULATORY_CARE_PROVIDER_SITE_OTHER): Payer: 59

## 2011-03-02 DIAGNOSIS — J301 Allergic rhinitis due to pollen: Secondary | ICD-10-CM

## 2011-03-03 NOTE — Assessment & Plan Note (Signed)
Summary: allergy/cb  Nurse Visit   Allergies: No Known Drug Allergies  Orders Added: 1)  Allergy Injection (1) [95115] 

## 2011-03-03 NOTE — Assessment & Plan Note (Signed)
Summary: ALLERGY/CB   Nurse Visit   Allergies: No Known Drug Allergies  Orders Added: 1)  Allergy Injection (1) [95115] 

## 2011-03-05 ENCOUNTER — Ambulatory Visit (INDEPENDENT_AMBULATORY_CARE_PROVIDER_SITE_OTHER): Payer: 59

## 2011-03-05 ENCOUNTER — Encounter: Payer: Self-pay | Admitting: Internal Medicine

## 2011-03-05 ENCOUNTER — Encounter: Payer: Self-pay | Admitting: Emergency Medicine

## 2011-03-05 ENCOUNTER — Ambulatory Visit (INDEPENDENT_AMBULATORY_CARE_PROVIDER_SITE_OTHER): Payer: 59 | Admitting: Emergency Medicine

## 2011-03-05 DIAGNOSIS — J309 Allergic rhinitis, unspecified: Secondary | ICD-10-CM

## 2011-03-05 DIAGNOSIS — J479 Bronchiectasis, uncomplicated: Secondary | ICD-10-CM

## 2011-03-05 DIAGNOSIS — J45909 Unspecified asthma, uncomplicated: Secondary | ICD-10-CM

## 2011-03-05 DIAGNOSIS — J301 Allergic rhinitis due to pollen: Secondary | ICD-10-CM

## 2011-03-10 ENCOUNTER — Ambulatory Visit (INDEPENDENT_AMBULATORY_CARE_PROVIDER_SITE_OTHER): Payer: 59

## 2011-03-10 DIAGNOSIS — J301 Allergic rhinitis due to pollen: Secondary | ICD-10-CM

## 2011-03-10 NOTE — Assessment & Plan Note (Signed)
Summary: asthma, bronchiectasis, allergies   Visit Type:  Follow-up Copy to:  Juanito Doom Primary Provider/Referring Provider:  Lysbeth Galas, [facility] [place]  CC:  COPD.  Pt c/o dry cough and hoarseness.Marland Kitchen  History of Present Illness: ROV 07/30/10 -- Hx bronchiectasis and asthma. Was seen in 6/11 for worsening cough, wheeze. Rx for exac with increased pred and Augmentin. Improved but still with cough and wheeze. Still on Pred 5 every other day ROV 08/29/10 -- returns for asthma, bronchiectasis, dyspnea. Also w hx MAIC.   ROV 10/17/10 -- Hx as above, on every other day pred 5mg . Since last visit seen by [name] re: allergic component to asthma control. His serum eval has been negative, but I want hime to get skin testing. Also has had CT scan that is stable as detailed below. Still with every day wheeze, daily cough prod of thick whitish mucous. Uses ProAir two times a day.  Skin test- POS Grass, Weed, Tree  ROV 12/18/10 -- COPD/asthma, dififcult to control. Has had skin testing w [name], about to start immunotherapy. Currently on Pred 5mg  every other day. He tried Daliresp x 3 days, but lost all appetite, so he stopped it. Unclear whether it helped. Still on Symbicort two times a day, uses DuoNebs 2 -3 x a day.  December 01, 2010-  Bronchiectasis, hx MAIC, sinusitis,  Nurse-CC: Follow up visit after Allergy test on Friday. Comes to review allergy testing results and make treatment choices.  We discussed allergy vaccine and after full discussion of risk and goals including anaphyllaxis, he wants to try. We also discussed a trial of Daliresp.   January 30, 2011- -  Bronchiectasis, hx MAIC, sinusitis..................grandson here Nurse-CC: 2 month follow up visit-allergies; increased thick congestion-mucinex not helping. Continues building vaccine w/o problems. Noting cough and thick white mucus, occ cough till retch and can't sleep.Denies fever, blood, purulent, chest pain, N&V. He heard  about Zpak at barber shop and wants to try.    February 06, 2011 --Presents for an acute office visit.Compalins of wheezing, rattling in chest, DOE, prod cough with white/yellow/green mucus x3days Was seen 1 week ago and given zpak that he states did not help. Cough and wheezig worse this am.Denies chest pain,  orthopnea, hemoptysis, fever, n/v/d, edema, headache. CXR today with no acute process. Mucinex not helping. Worse cough at night. Wears out walking.   ROV 03/05/11 -- f/ bronchiectasis, moderate persistant asthma and COPD. Seen 2/17 as above and rx for acute flare. Now back to pred 5mg  every other day. Started allergy shots since our last visit, still working up to final dosing. Not sure he can tell any difference yet. Taking mucinex and claritin. Feels like breathing, wheezing and cough are back to baseline.   Preventive Screening-Counseling & Management  Alcohol-Tobacco     Smoking Status: quit     Packs/Day: 1.5     Year Started: 1978     Year Quit: 2008     Pack years: 12  Current Medications (verified): 1)  Mucinex Dm 30-600 Mg  Tb12 (Dextromethorphan-Guaifenesin) .... Take 1-2 Tablets Two Times A Day As Needed 2)  Proair Hfa 108 (90 Base) Mcg/act  Aers (Albuterol Sulfate) .... Inhale 2 Puffs Every 4 Hrs As Needed 3)  Claritin 10 Mg  Tabs (Loratadine) .... Take 1 Tablet By Mouth Once A Day 4)  Symbicort 160-4.5 Mcg/act  Aero (Budesonide-Formoterol Fumarate) .... Inhale 2 Puffs Two Times A Day 5)  Ipratropium-Albuterol 0.5-2.5 (3) Mg/41ml Soln (Ipratropium-Albuterol) .Marland KitchenMarland KitchenMarland Kitchen  1 Vial in Nebulizer Up To Four Times Dailyprn 6)  Prednisone 5 Mg Tabs (Prednisone) .Marland Kitchen.. 1 By Mouth Daily 7)  Robitussin Cough/cold Cf 5-10-100 Mg/29ml Liqd (Phenylephrine-Dm-Gg) .... As Directed On Bottle 8)  Omeprazole 20 Mg Cpdr (Omeprazole) .Marland Kitchen.. 1 By Mouth Once Daily 9)  Allergy Vaccine Gh .... New Start 10)  Oxygen 2 Liters .... With Exertion Only  Allergies (verified): No Known Drug Allergies  Vital  Signs:  Patient profile:   55 year old male Height:      73 inches (185.42 cm) Weight:      235 pounds (106.82 kg) BMI:     31.12 O2 Sat:      97 % on Room air Temp:     97.9 degrees F (36.61 degrees C) oral Pulse rate:   82 / minute BP sitting:   146 / 82  (left arm) Cuff size:   regular  Vitals Entered By: [name] CMA (March 05, 2011 9:24 AM)  O2 Sat at Rest %:  97 O2 Flow:  Room air CC: COPD.  Pt c/o dry cough and hoarseness. Comments Medications reviewed with patient [name] CMA  March 05, 2011 9:32 AM    Impression & Recommendations:  Problem # 1:  ASTHMA (ICD-493.90) - pred 5 every other day - allergy regimen - symbicort + SABA - repeat IgE next time; still considering possibility of xolair  Problem # 2:  ALLERGIC RHINITIS CAUSE UNSPECIFIED (ICD-477.9)  His updated medication list for this problem includes:    Claritin 10 Mg Tabs (Loratadine) .Marland Kitchen... Take 1 tablet by mouth once a day  Orders: Est. Patient Level IV (16109)  Problem # 3:  Hx of ATYPICAL MYCOBACTERIAL INFECTION (ICD-031.9)  Problem # 4:  BRONCHIECTASIS (ICD-494.0) - last CT was 08/2010, prob repeat in 9/12 unless he flares again, then sooner  Patient Instructions: 1)  Continue your Prednisone 5mg  every other day  2)  Continue Symbicort two times a day 3)  Continue your allergy shots as directed by [name] 4)  Follow with [name] in 3 months or as needed

## 2011-03-10 NOTE — Assessment & Plan Note (Signed)
Summary: ALLERGY/CB   Nurse Visit   Allergies: No Known Drug Allergies  Orders Added: 1)  Allergy Injection (1) [95115] 

## 2011-03-13 ENCOUNTER — Ambulatory Visit (INDEPENDENT_AMBULATORY_CARE_PROVIDER_SITE_OTHER): Payer: 59

## 2011-03-13 DIAGNOSIS — J301 Allergic rhinitis due to pollen: Secondary | ICD-10-CM

## 2011-03-17 ENCOUNTER — Ambulatory Visit (INDEPENDENT_AMBULATORY_CARE_PROVIDER_SITE_OTHER): Payer: 59

## 2011-03-17 DIAGNOSIS — J301 Allergic rhinitis due to pollen: Secondary | ICD-10-CM

## 2011-03-19 NOTE — Letter (Signed)
Summary: PFT Request/NCDDS  PFT Request/NCDDS   Imported By: Sherian Rein 03/09/2011 14:38:32  _____________________________________________________________________  External Attachment:    Type:   Image     Comment:   External Document

## 2011-03-20 ENCOUNTER — Ambulatory Visit (INDEPENDENT_AMBULATORY_CARE_PROVIDER_SITE_OTHER): Payer: 59

## 2011-03-20 DIAGNOSIS — J301 Allergic rhinitis due to pollen: Secondary | ICD-10-CM

## 2011-03-23 ENCOUNTER — Ambulatory Visit (INDEPENDENT_AMBULATORY_CARE_PROVIDER_SITE_OTHER): Payer: 59

## 2011-03-23 DIAGNOSIS — J301 Allergic rhinitis due to pollen: Secondary | ICD-10-CM

## 2011-03-26 ENCOUNTER — Ambulatory Visit (INDEPENDENT_AMBULATORY_CARE_PROVIDER_SITE_OTHER): Payer: 59

## 2011-03-26 DIAGNOSIS — J301 Allergic rhinitis due to pollen: Secondary | ICD-10-CM

## 2011-03-31 ENCOUNTER — Ambulatory Visit (INDEPENDENT_AMBULATORY_CARE_PROVIDER_SITE_OTHER): Payer: 59

## 2011-03-31 DIAGNOSIS — J301 Allergic rhinitis due to pollen: Secondary | ICD-10-CM

## 2011-04-02 ENCOUNTER — Ambulatory Visit (INDEPENDENT_AMBULATORY_CARE_PROVIDER_SITE_OTHER): Payer: 59

## 2011-04-02 DIAGNOSIS — J309 Allergic rhinitis, unspecified: Secondary | ICD-10-CM

## 2011-04-10 ENCOUNTER — Ambulatory Visit (INDEPENDENT_AMBULATORY_CARE_PROVIDER_SITE_OTHER): Payer: 59

## 2011-04-10 ENCOUNTER — Telehealth: Payer: Self-pay | Admitting: Internal Medicine

## 2011-04-10 DIAGNOSIS — J309 Allergic rhinitis, unspecified: Secondary | ICD-10-CM

## 2011-04-10 NOTE — Telephone Encounter (Signed)
Spoke w/ pt and he states Dr. Maple Hudson advised him once he started getting his allergy vaccines once a week then he could start getting them at Dr. Joyce Copa office. Pt states he is down to once a week now and wants to get his vaccine at Dr. Joyce Copa office b/c it is closer to home. Please advise Dr. Maple Hudson. Thanks  Carver Fila, CMA

## 2011-04-10 NOTE — Telephone Encounter (Signed)
OK to get allergy shots given at Dr Darcel Bayley Nyland's office, if Dr Lysbeth Galas agrees. As ususal, we need to have direct contact with the nurse who would be giving the shots. They can't let him give his own, he needs to carry an Epipen there, and they need our instructions for dealing with problems and out direct contact line.

## 2011-04-14 ENCOUNTER — Encounter: Payer: Self-pay | Admitting: Internal Medicine

## 2011-04-14 NOTE — Telephone Encounter (Signed)
I spoke with Wilkie Aye at Dr. Joyce Copa office [phone] she states the pt will need an appt there to discuss. He has not been seen in over a year. She said that they should be able to give the allergy injections at their office. Will print msg and give to Dimas Millin in our allergy lab.

## 2011-04-14 NOTE — Telephone Encounter (Signed)
I called [name] and lmtcb 04-14-11. He called back;he is going to call Dr.Nyland office and make an appt. With him. Once he's seen Dr.Nyland he will come in and sign the wavier and we will give him the necessary paperwork he needs to take to Dr.Nyland's office. We will contact Dr.Nyland's office & make sure they know about & understand the paperwork and yes, we'll give them our # in case they have any questions.

## 2011-04-17 ENCOUNTER — Ambulatory Visit (INDEPENDENT_AMBULATORY_CARE_PROVIDER_SITE_OTHER): Payer: 59

## 2011-04-17 DIAGNOSIS — J309 Allergic rhinitis, unspecified: Secondary | ICD-10-CM

## 2011-04-24 ENCOUNTER — Ambulatory Visit (INDEPENDENT_AMBULATORY_CARE_PROVIDER_SITE_OTHER): Payer: 59

## 2011-04-24 DIAGNOSIS — J309 Allergic rhinitis, unspecified: Secondary | ICD-10-CM

## 2011-04-26 ENCOUNTER — Other Ambulatory Visit: Payer: Self-pay | Admitting: Emergency Medicine

## 2011-04-27 ENCOUNTER — Telehealth: Payer: Self-pay | Admitting: Emergency Medicine

## 2011-04-27 MED ORDER — BUDESONIDE-FORMOTEROL FUMARATE 160-4.5 MCG/ACT IN AERO: 2.0000 | INHALATION_SPRAY | Freq: Two times a day (BID) | RESPIRATORY_TRACT | Status: DC

## 2011-04-27 NOTE — Telephone Encounter (Signed)
rx was sent to pharm.  LMOM for pt to be informed this was done.

## 2011-04-28 NOTE — Telephone Encounter (Signed)
Refill sent 04-27-11 per EPIC note

## 2011-05-01 ENCOUNTER — Encounter: Payer: Self-pay | Admitting: Adult Health

## 2011-05-01 ENCOUNTER — Ambulatory Visit (INDEPENDENT_AMBULATORY_CARE_PROVIDER_SITE_OTHER): Payer: 59

## 2011-05-01 DIAGNOSIS — J309 Allergic rhinitis, unspecified: Secondary | ICD-10-CM

## 2011-05-04 ENCOUNTER — Ambulatory Visit: Payer: 59 | Admitting: Adult Health

## 2011-05-05 NOTE — Assessment & Plan Note (Signed)
[facility]                             PULMONARY OFFICE NOTE   NAME:Rexrode, Jefferey                         MRN:          [id number]  DATE:08/16/2007                            DOB:          November 14, 1956    HISTORY:  A 55 year old white male comes in for his first comprehensive  health care evaluation.  I had previously evaluated him for choking  spells in August of 2007 that I thought were related to angiotensin-  converting enzyme inhibitor use.  These resolved but in the last month  he has had increasing cough, congestion, subjective wheeze with  minimally discolored mucous that wakes him up in the mornings.  He had  been maintained on the Symbicort 80/4.5 two puffs b.i.d., or says it was  recommended that he maintain it, but he has not been taking this  consistently and averages maybe 1 or 2 puffs a day total.   He does notice decrease in activity tolerance but is not aerobically  active.  He denies any pleuritic pain, exertional chest pain, orthopnea,  PND or leg swelling.  He has been using his albuterol more than usual.  In fact, he used it before he came to the office today.  Previous to a  month ago he was using albuterol not at all.   PAST MEDICAL HISTORY:  1. Hypertension.  2. Chronic obstructive pulmonary disease/asthma.   ALLERGIES:  None known.   MEDICATIONS:  Benicar 20 mg 1 daily, Symbicort 80/4.5 two puffs b.i.d.,  but not taking as outlined above.  He is also supposed to have albuterol  and he was supposed to use Claritin for itching and sneezing, has been  using Benadryl instead.   SOCIAL HISTORY:  He quit smoking August 2007.   FAMILY HISTORY:  Positive for emphysema in his father who smoked.  Father also began having heart problems in his 73s.   REVIEW OF SYSTEMS:  Taken in detail on the worksheet, significant for  increasing nasal congestion and sensation that he has something  draining in his throat with no sinus pain or  overt reflux symptoms,  fevers, chills, sweats, unintended weight loss.   PHYSICAL EXAMINATION:  This is a stoic white male in no acute distress.  He is afebrile with normal vital signs.  HEENT:  Reveals moderate turbinate edema.  Oropharynx is clear.  Dentition is intact.  NECK:  Supple without cervical adenopathy or tenderness.  Trachea is  midline, no thyromegaly.  Carotid upstrokes are brisk without any  bruits.  Ocular exam with limited funduscopy revealed minimal arterial change.  CHEST:  Revealed inspiratory and expiratory rhonchi bilaterally with end-  expiratory wheeze as well.  There was mild hyperinflation to percussion.  HEART:  Regular rhythm without murmurs, gallops, or rubs or displacement  of PMI.  ABDOMEN:  Soft, benign with no palpable organomegaly or masses or  tenderness.  EXTREMITIES:  Warm without calf tenderness, cyanosis, clubbing, or  edema.  GENITOURINARY:  Testes descended bilaterally without nodules.  RECTAL:  Revealed mild BPH, smooth texture, symmetric with no nodules.  Stool was  guaiac negative.  EXTREMITIES:  Warm, pedal pulses were intact bilaterally.  NEUROLOGIC:  No focal deficit or pathologic reflexes.  SKIN EXAM:  Warm and dry.  MUSCULOSKELETAL EXAM:  Unremarkable.   Lab data included chest x-ray showing mild chronic obstructive pulmonary  disease.  CBC was normal.  Chemistry profile was normal.  LDL  cholesterol was 103.7, TSH was normal.   EKG was also normal.   IMPRESSION:  1. Poorly controlled chronic asthmatic bronchitis with a questionable      element of chronic obstructive pulmonary disease.  I have asked him      to increase Symbicort to 160/4.5 two puffs b.i.d.  I spent extra      time teaching him how to use it effectively.  I have also reviewed      with him optimal use of p.r.n. including Mucinex DM for cough,      albuterol for wheezing and shortness of breath, and Claritin rather      than Benadryl for nasal complaints.  2.  Hypertension is well controlled on present regimen so I did not      change.  3. General health maintenance.  He was due tetanus today plus      Pneumovax.  He did agree to tetanus, but declined the Pneumovax      based on what he's heard.  We had a discussion about this, but he      did not agree to the Pneumovax.  We will revisit this issue when he      returns.  He is also due a colonoscopy which we will ask him to      schedule between now and his next visit in 6 weeks for pulmonary      function tests.     Charlaine Dalton. Sherene Sires, MD, [facility]  Electronically Signed    MBW/MedQ  DD: 08/17/2007  DT: 08/17/2007  Job #: [id number]

## 2011-05-05 NOTE — Assessment & Plan Note (Signed)
 HEALTHCARE                             PULMONARY OFFICE NOTE   NAME:Craig Arnold, Craig Arnold                         MRN:          [id number]  DATE:09/20/2007                            DOB:          1956/09/21    HISTORY OF PRESENT ILLNESS:  Patient is a 55 year old white male patient  of Dr. Thurston Hole who has a known history of chronic asthmatic bronchitis  that presents today for an acute office visit.  Patient complains that  over the last week, he has had a productive cough with thick white  sputum, wheezing, and increased shortness of breath with activity.  Patient denies any purulent sputum, fever, hemoptysis, orthopnea, PND,  or leg swelling.   Patient is currently maintained on Symbicort 160/4.5 2 puffs twice  daily.  Patient reports he has not missed any doses of his medication.   PAST MEDICAL HISTORY:  Reviewed.   CURRENT MEDICATIONS:  Reviewed.   PHYSICAL EXAMINATION:  Patient is a pleasant male in no acute distress.  He is afebrile with stable vital signs.  O2 saturation is 94% on room  air.  HEENT:  Unremarkable.  NECK:  Supple without adenopathy.  No JVD.  LUNGS:  The lung sounds reveal some scattered rhonchi with expiratory  wheezes.  CARDIAC:  Regular rate.  ABDOMEN:  Soft and nontender.  EXTREMITIES:  Warm without any edema.   IMPRESSION/PLAN:  Acute asthmatic bronchitic exacerbation.  Patient is  to begin prednisone taper over the next week.  A Xopenex nebulizer  was  given today in the office.  Patient was an add-in.  May use Mucinex DM  twice daily as needed for cough and congestion.  Patient is to follow  back up in one month as scheduled or sooner as needed.      Rubye Oaks, NP  Electronically Signed      Charlaine Dalton. Sherene Sires, MD, [facility]  Electronically Signed   TP/MedQ  DD: 09/21/2007  DT: 09/21/2007  Job #: [id number]

## 2011-05-05 NOTE — Assessment & Plan Note (Signed)
[facility]                             PULMONARY OFFICE NOTE   NAME:Craig Arnold, Craig Arnold                         MRN:          [id number]  DATE:05/10/2007                            DOB:          [birth year]    PULMONARY FOLLOW-UP OFFICE VISIT:  A 55 year old white male with  documented COPD with an asthmatic component by PFTs on Apr 25, 2007,  doing exceptionally well on Symbicort 80/4.5 two puffs b.i.d.  On this  regimen, he has not felt the need for ProAir.  Denies any symptoms  either with exertion or nocturnally of either cough or dyspnea.   PHYSICAL EXAMINATION:  GENERAL:  He is a pleasant, ambulatory white male  in no acute distress.  VITAL SIGNS:  He is afebrile with normal vital signs.  HEENT:  Unremarkable.  Oropharynx is clear.  LUNGS:  Perfectly clear bilaterally to auscultation and percussion  except breath sounds are a bit diminished.  Expiratory time is minimally  prolonged.  HEART:  Regular rhythm without murmur, rub or gallop.  ABDOMEN:  Soft, benign.  EXTREMITIES:  Warm without calf tenderness, clubbing, cyanosis or edema.   Heme saturation is 99% on room air.   MDI technique is reviewed and is no better than 50% without coaching.  With coaching, it improves to 90%.   IMPRESSION:  1. Chronic obstructive pulmonary disease with a definite asthmatic      component  that is relatively well controlled, considering he is      not optimally using his MDI's.  I have reviewed with him optimal      treatment with Symbicort 84.5 two puffs b.i.d. and reminded him      that ProAir can be used p.r.n. for exacerbations.  Follow up can be      in three months.  2. Hypertension:  Well controlled off of lisinopril, which eliminated      the choking and coughing symptoms that he was having.  I      recommended that he maintain Benicar indefinitely, although to save      money, he could change the Benicar to 40 one-half tablet daily.   Patient  requested primary care here.  I am happy to do this if he will  come in for comprehensive health care evaluation at age 89, which will  include colorectal screening as well as lipid screening.     Charlaine Dalton. Sherene Sires, MD, [facility]  Electronically Signed    MBW/MedQ  DD: 05/10/2007  DT: 05/11/2007  Job #: [id number]

## 2011-05-05 NOTE — Assessment & Plan Note (Signed)
[facility]                            CARDIOLOGY OFFICE NOTE   NAME:Craig Arnold                         MRN:          [id number]  DATE:09/24/2008                            DOB:          1956-03-10    I was asked by Dr. Levy Pupa and Tammy Parrett to evaluate Craig Arnold about a recent episode of syncope.   This happened about a week ago.   HISTORY OF PRESENT ILLNESS:  Craig Arnold is 55 years of age, married, and Craig Arnold  comes today with his wife, [name].  She is a very helpful  historian.   Craig Arnold has severe COPD.  This is felt to be multifactorial related  to smoking as well as to dust and fumes.   Craig Arnold is on an extensive medical program and goes on and off prednisone  frequently.  Craig Arnold had been off prednisone for several weeks prior to this  past Monday a week ago when this happened.  Apparently, his son said Craig Arnold  was sitting on the couch, where Craig Arnold had been for about 10 minutes.  Craig Arnold  was leaning forward trying to breathe.  Craig Arnold was having particularly bad  day with forced exhalation and lots of wheezing and rhonchi.  Craig Arnold  suddenly just went back after looking distant to his son and with his  lips curled back as his wife said.  Craig Arnold did not hit his head.  Craig Arnold came to  in about 5 seconds.  Craig Arnold was clear and knew where Craig Arnold was.  There was no  bowel or bladder incontinence.  However, Craig Arnold did not remember fainting.   Craig Arnold denied any chest discomfort, tachy palpitations, or anything else  prior to the faint.  Craig Arnold says on occasion, Craig Arnold does get short of breath  with some palpitations.   Craig Arnold has not fainted, but one other time and that was when Craig Arnold was a child.   Craig Arnold denies any orthopnea, PND, or peripheral edema.  Craig Arnold does on occasion  cough so hard, Craig Arnold feels like Craig Arnold is going to pass out.  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   PAST MEDICAL HISTORY:  Craig Arnold does not smoke.  Craig Arnold quit in August 30, 2006.  Craig Arnold does not drink any alcohol.   SURGICAL HISTORY:  Negative.   CURRENT MEDICATIONS:  1. Mucinex DM 600 mg daily.  2. Claritin 10 mg a day.  3. Nasacort nasal spray daily.  4. Symbicort inhaler daily.  5. Clarithromycin 500 mg p.o. b.i.d.  6. Ethambutol 1200 mg daily.  7. Mycobutin 150 mg 2 daily.  8. Spiriva HandiHaler 1 capsule daily.  9. Prednisone taper, which Craig Arnold is back on since this past Wednesday      after his fainting, O2 of 2 liters daily.   ALLERGIES:  Craig Arnold has no known drug allergies.   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  Craig Arnold has also  had bronchiectasis.  Craig Arnold has a pulmonary nodule, gastroesophageal reflux,  and hypertension.   FAMILY HISTORY:  Remarkable for premature coronary  artery disease in his  father in his 15s.   SOCIAL HISTORY:  [place]  Craig Arnold is taken care  by Dr. Joette Catching, his primary care physician.  Craig Arnold is married and  has 2 children.  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   REVIEW OF SYSTEMS:  Other than HPI, is negative.   PHYSICAL EXAMINATION:  VITAL SIGNS:  His blood pressure 122/78 and pulse  is 81 and regular.  His EKG is normal both at [facility] Parrett's office as  well as today.  There is no evidence of right-sided axis deviation or  right atrial enlargement.  Craig Arnold is 6 feet 1 inch and weighs 200 pounds.  GENERAL:  Craig Arnold looks older than stated age.  Craig Arnold has got a very plethoric  complexion of his face.  Craig Arnold is bearded and gray.  HEENT:  Sclerae are injected.  In dentition, Craig Arnold is missing teeth.  Otherwise, HEENT is unremarkable.  NECK:  Carotid upstrokes were equal bilaterally without bruits.  There  is no thyromegaly.  There is no JVD.  Thyroid is not enlarged.  Trachea  is midline.  Neck is supple.  LUNGS:  Clear today to auscultation and percussion.  Craig Arnold had no major  rhonchi today.  HEART:  A regular rate and rhythm.  PMI is normal.  There is no right-  sided lift.  S2 splits physiologically.  ABDOMEN:  Soft.  Good bowel  sounds.  No midline bruit.  No hepatomegaly.  EXTREMITIES:  No edema.  Pulses are present.  NEUROLOGIC:  Intact.  Craig Arnold is alert and oriented.  His affect is normal.  Craig Arnold has somewhat of a stutter, but otherwise speaks fast and clearly.   Chest x-ray on the September 19, 2008, showed chronic lung disease, but  no acute superimposed process.   ASSESSMENT:  1. Syncope.  This sounds mostly related to his pulmonary status with      chronic obstructive pulmonary disease exacerbation, off prednisone.      Craig Arnold probably was forced exhaling while Craig Arnold was sitting down, which      was probably causing a problem with venous return and fluctuation      in his blood pressure.  Craig Arnold also appears to have caused syncope as      well.   Craig Arnold may have some pulmonary hypertension, which need to rule out with an  echocardiogram with his severe chronic obstructive pulmonary disease.  In addition, we need to rule out any coronary artery disease with a  dobutamine Myoview.   If this last test are negative, I would assume this is all related to  his chronic obstructive pulmonary disease.  With him driving a truck and  having near-cough syncope, I would recommend consideration of  disability.  Craig Arnold will discuss this with Dr. Delton Coombes.     Thomas C. Daleen Squibb, MD, [facility]  Electronically Signed    TCW/MedQ  DD: 09/24/2008  DT: 09/25/2008  Job #: [id number]   cc:   Leslye Peer, MD  Rubye Oaks, NP

## 2011-05-05 NOTE — Assessment & Plan Note (Signed)
[facility]                             PULMONARY OFFICE NOTE   NAME:Arnold, Craig                         MRN:          [id number]  DATE:10/21/2007                            DOB:          [birth year]    SUBJECTIVE:  Mr. Trusty is a 55 year old gentleman with COPD and  reactive airways component with bronchodilator responsiveness.  He also  has  history of chronic nasal congestion, nasal drip, and associated  cough.  He returns today telling me that his dyspnea is about the same.  His throat continues to feel irritated and he has frequent cough  productive of white thick mucus.  He has been using Pro-Air 1-2 times a  day.  Since our last visit he has had a CT scan of the sinuses that  showed acute left maxillary sinusitis and pan sinus mucosal thickening.  I started him on Clindamycin 450 mg p.o. t.i.d. to be used for 6 weeks.  He has been on the medication for 2 weeks.  He complains of continued  exertional dyspnea.   CURRENT MEDICATIONS:  1. Symbicort 160/4.5 mcg two puffs b.i.d.  2. Benicar 20 mg daily.  3. Claritin 10 mg daily.  4. Nasocort AQ two sprays to each nostril daily.  5. Clindamycin 450 mg p.o. t.i.d.  6. Mucinex DM p.r.n.  7. Pro-Air two puffs q.4 hours p.r.n. shortness of breath.   EXAMINATION:  GENERAL:  This is a thin, comfortable gentleman in no  distress on room air.  His weight is 208 pounds. Blood pressure 124/72,  temperature 97.7, heart rate 116, O2 saturations 94% on room air.  HEENT EXAM:  His posterior pharynx has some erythema and evidence of  post nasal drip, his nares are inflamed but without bleeding.  NECK:  Significant for mild inspiratory and expiratory stridor.  LUNGS:  Bilateral expiratory wheezes in all lung fields.  HEART:  Regular, borderline tachycardia without a murmur.  ABDOMEN:  Benign.  EXTREMITIES:  No clubbing, cyanosis or edema.   IMPRESSION:  1. Chronic obstructive pulmonary disease with reactive  airways and      positive bronchodilator response. He continues to have exertional      dyspnea and also baseline wheezing.  I will start him on Spiriva      one inhalation daily, he will continue his Symbicort and albuterol      as ordered.  2. Chronic sinusitis.  He will continue his Clindamycin as ordered for      6 weeks total. I have asked him to start using nasal saline washes      on a daily basis, he will also continue his Claritin and Nasocort      as currently ordered.  3. Allergic rhinitis. We will treat as indicated above.  4. I will followup with Mr. Gropp in 6 weeks or sooner if he has any      difficulty in the interim. Ernestina Penna, M.D.     Leslye Peer, MD  Electronically Signed    RSB/MedQ  DD: 10/22/2007  DT: 10/23/2007  Job #:  [id number]   cc:   Ernestina Penna, M.D.

## 2011-05-05 NOTE — Assessment & Plan Note (Signed)
[facility]                             PULMONARY OFFICE NOTE   NAME:Craig Arnold, Craig Arnold                         MRN:          [id number]  DATE:07/05/2007                            DOB:          05-22-1956    This is a pulmonary followup office visit on a very nice 55 year old  white male, former smoker, with an FEV1 of around 2 liters, documented  in May, with significant asthmatic component, maintained on Symbicort  80/4.52 b.i.d. and Benicar 20 mg , which together totally eliminate all  of his symptoms.  He denies specifically any dyspnea or cough,  difficulty with exertion or sleep.   PHYSICAL EXAMINATION:  He is a pleasant, ambulatory white male, in no  acute distress.  Stable vital signs.  HEENT is unremarkable.  Oropharynx clear.  Lung fields reveal diminished breath sounds without wheezing.  He has regular rate and rhythm, without murmur, gallop or rub.  ABDOMEN:  Soft, benign.  EXTREMITIES:  Warm without calf tenderness, cyanosis, clubbing or edema.   His MDI technique is now close to 90% effective.   IMPRESSION:  Excellent control of the asthmatic component of COPD on his  present low-dose Symbicort.  No significant coughing on Benicar, which  is also controlling his blood pressure well.   Followup to be in the context of a comprehensive health care evaluation  in three months.  We will see him sooner if needed.     Charlaine Dalton. Sherene Sires, MD, [facility]  Electronically Signed    MBW/MedQ  DD: 07/05/2007  DT: 07/06/2007  Job #: [id number]

## 2011-05-05 NOTE — Assessment & Plan Note (Signed)
 HEALTHCARE                             PULMONARY OFFICE NOTE   NAME:Craig Arnold, Craig Arnold                         MRN:          [id number]  DATE:06/14/2007                            DOB:          12-28-1955    HISTORY OF PRESENT ILLNESS:  The patient is 55 -year-old white male  patient of Dr.  Thurston Hole who has a known history of a documented COPD with  an asthmatic component who presents today for an acute office visit. The  patient has been having difficulty with persistent cough and congestion  and was felt to have some upper airway instability. He was changed over  from his ACE inhibitor over to an ARB recently and was treated  aggressively for cough suppression regimen. He was also added in  Symbicort 80/4.5 two puffs twice daily. The patient reports that his  symptoms substantially improved and was seen in office last month with  total resolution of cough and wheezing. However, approximately one week  ago, the patient was working outside with a lot of smoke and started  having increased cough, congestion and wheezing. The patient denies any  chest pain, orthopnea, PND or leg swelling.   PAST MEDICAL HISTORY:  Reviewed.   CURRENT MEDICATIONS:  Reviewed.   PHYSICAL EXAMINATION:  The patient is pleasant male in no acute  distress. He is afebrile with stable vital signs. O2 saturation is 92%  on room air.  HEENT: Unremarkable.  NECK: Supple without cervical adenopathy. No JVD.  LUNGS: Lung sounds reveal some expiratory wheezes with some upper airway  pseudo wheezing.  CARDIAC: Regular rate.  ABDOMEN: Soft and nontender.  EXTREMITIES: Warm without any edema.   IMPRESSION/PLAN:  Acute exacerbation of asthmatic bronchitis assoc with  possible vcd. The patient is to begin a cough suppression regimen with  Mucinex DM twice daily along with tramadol 50 mg every four hours for  breakthrough coughing. Begin a prednisone taper over the next week. Add  in  Prilosec 20 mg daily for any residual reflux that could be irritating  the airways.  The patient was given instruction sheet on cough prevention. The patient  will return back here in two weeks with Dr.  Sherene Sires or sooner if needed.      Rubye Oaks, NP  Electronically Signed      Charlaine Dalton. Sherene Sires, MD, [facility]  Electronically Signed   TP/MedQ  DD: 06/14/2007  DT: 06/14/2007  Job #: [id number]

## 2011-05-05 NOTE — Assessment & Plan Note (Signed)
[facility]                             PULMONARY OFFICE NOTE   NAME:Craig Arnold, Craig Arnold                         MRN:          [id number]  DATE:09/01/2007                            DOB:          [birth year]    HISTORY OF PRESENT ILLNESS:  The patient is a 55 year old white male  patient of Dr. Thurston Hole, who has a known history of chronic asthmatic  bronchitis, that presents today for a two-week followup.  Last visit,  the patient had been having some increased congestion, cough and  wheezing.  His Symbicort was increased up to 80 to 160/4.5 mcg, 2 puffs  twice daily.  The patient returns back, reported that his breathing has  improved, that it seems that now he has increased activity tolerance,  especially with recent incline.  The patient has only had to use his  albuterol once in the last 3 weeks, since his last visit.  Reports he is  tolerating his medication well.  The patient has brought all of his  medications in today for review, which are correct with our medication  list.   PAST MEDICAL HISTORY:  Reviewed.   CURRENT MEDICATIONS:  Reviewed.   PHYSICAL EXAMINATION:  GENERAL:  The patient is a pleasant male, no  acute distress.  He is afebrile.  VITAL SIGNS:  Blood pressure is 150/96.  O2 saturations 99% on room air.  HEENT:  Unremarkable.  NECK:  Supple without cervical adenopathy.  No JVD.  LUNGS:  Sounds reveal diminished breath sounds at the bases.  CARDIAC:  Regular rate.  ABDOMEN:  Soft and nontender.  EXTREMITIES:  Warm without any edema.   IMPRESSION/PLAN:  1. Chronic asthmatic bronchitis with a suspected component of chronic      obstructive pulmonary disease.  The patient is a former smoker.      The patient does seem to be clinically improved on Symbicort      160/4.5 mcg, two puffs twice daily.  Will continue on his present      regimen.  He will return back here in 2 months for a repeat      pulmonary function test and follow up  with Dr. Sherene Sires.  2. Complex medication regimen.  The patient's medications were      reviewed in detail.  The patient education was provided .  A      computerized medication calendar was completed for this patient,      reviewed in detail.  The patient was able to read back his list to      me at his office visit.  The patient is aware to bring this back to      his return office visits.  3. Health maintenance screenings.  The patient is due for his routine      colonoscopy, which has      been set up for October 15th.  The patient is aware and has agreed      to follow up accordingly.      Rubye Oaks, NP  Electronically Signed      Casimiro Needle  Denice Paradise, MD, [facility]  Electronically Signed   TP/MedQ  DD: 09/01/2007  DT: 09/02/2007  Job #: [id number]

## 2011-05-05 NOTE — Assessment & Plan Note (Signed)
[facility]                             PULMONARY OFFICE NOTE   NAME:Delmundo, [place]                         MRN:          [id number]  DATE:10/07/2007                            DOB:          1956/02/08    SUBJECTIVE:  Craig Arnold is a 55 year old man who has been followed in  our office by Dr. Sherene Sires for COPD with a reactive airways component and  chronic bronchitis.  He was originally seen in April and had symptoms of  exertional dyspnea, dyspnea at rest.  He also had very significant nasal  congestion and drainage with associated cough that has been productive  of thick sputum since October of 2007.  He had been treated at least 5  times with prednisone tapers as well as antibiotics.  His symptoms have  not had significant change.  Dr. Sherene Sires has done pulmonary function  testing which confirmed air flow limitation and a positive  bronchodilator response.  He has been treated for allergic rhinitis and  also with mucolytics.  He was also started on Symbicort 160/4.5 2 puffs  b.i.d.  He returns today stating that he continues to wheeze, continues  to cough, producing yellowish to thick white phlegm.  He uses Pro-Air  about once daily.  He has significant nasal congestion, complains of a  nasty taste when he blows his nose. He states that his nasal drainage  is usually clear on the right but is always yellow and dark on the left.  He has daily cough that is usually yellowish-brown.   CURRENT MEDICATIONS:  1. Symbicort 160/4.5 two puffs b.i.d.  2. Benicar 20 mg daily.  3. Mucinex DM q.12 hours p.r.n.  4. Pro-Air 2 puffs q.4 hours p.r.n. shortness of breath.  5. Claritin 10 mg daily p.r.n.   EXAMINATION:  GENERAL:  This is a thin, comfortable gentleman.  His  weight is 206 pounds, temperature is 97.6, blood pressure 108/82, heart  rate 101, oxygen saturation 94% on room air.  HEENT EXAM:  Posterior pharynx is erythematous with some evidence of  active post  nasal drip, nares are inflamed, there is no active bleeding.  NECK:  Supple without any lymphadenopathy, he does have some mild upper  airway noise and stridor.  LUNGS:  Are significant for bilateral mild end-expiratory wheezes as  well as referred noise from his upper airway.  HEART:  Regular without a murmur.  ABDOMEN:  Benign.  EXTREMITIES:  Have no cyanosis, clubbing or edema.   IMPRESSION:  1. Chronic obstructive pulmonary disease with reactive airways      component and positive bronchodilator response.  2. Chronic post nasal drip with associated cough, upper airway      irritation.  This may be due to an acute and/or chronic sinusitis      given his complaints of headache and colored nasal drainage.   PLAN:  1. I have asked him to restart his Claritin on a scheduled basis and      to start Nasocort AQ two sprays to each nostril daily.  2. I will obtain a CT  scan of his sinuses to look for acute and/or      chronic sinusitis.  If he does have chronic sinusitis then I      believe he needs to be treated for 6 weeks to achieve irradiation.  3. He will continue his Symbicort as before and albuterol on a p.r.n.      basis.  4. We may need to consider adding Spiriva to his regimen given his      continued dyspnea.     Leslye Peer, MD  Electronically Signed    RSB/MedQ  DD: 10/22/2007  DT: 10/23/2007  Job #: [id number]   cc:   Ernestina Penna, M.D.

## 2011-05-08 ENCOUNTER — Ambulatory Visit (INDEPENDENT_AMBULATORY_CARE_PROVIDER_SITE_OTHER): Payer: 59

## 2011-05-08 DIAGNOSIS — J309 Allergic rhinitis, unspecified: Secondary | ICD-10-CM

## 2011-05-08 NOTE — Assessment & Plan Note (Signed)
[facility]                             PULMONARY OFFICE NOTE   NAME:Arnold, Craig                         MRN:          [id number]  DATE:03/23/2007                            DOB:          February 09, 1956    CHIEF COMPLAINT:  Choking up.   HISTORY:  This is a 55 year old white male, previous patient of Dr.  Lenore Cordia, who plans to establish with Dr. Melene Muller office,  who  quit smoking in August of 2007 and suddenly began having choking spells  in October of 2007 associated with a hacking cough productive of minimum  sputum. He denies any pleuritic pain, fevers, chills, sweats, orthopnea,  PND or leg swelling. He says he is somewhat better after using Advair.  He has been diagnosed with both emphysema, bronchitis and asthma and  wants to know what he has.   Interestingly, unless he is having his choking spell he has no  dyspnea, although he is relatively inactive. He denies any purulent  sputum production, nocturnal wheezing or choking, fevers, chills,  sweats, orthopnea, PND or leg swelling, unintended weight loss or chest  pain.   PAST MEDICAL HISTORY:  Significant for:  1. Hypertension, for which he is on ACE inhibitors.  2. Asthma.  3. Chronic obstructive pulmonary disease as noted above.   ALLERGIES:  None known.   MEDICATIONS:  1. Lisinopril.  2. Advair.  3. And a bag of pills, some of which are sticky and soaking wet with      cough syrup.   SOCIAL HISTORY:  He quit smoking in August 2007 as noted above. He works  in Aeronautical [employer].   FAMILY HISTORY:  Is positive for emphysema in his father who smoked.   REVIEW OF SYSTEMS:  Taken in detail on the worksheet and negative except  as outlined above except for occasional itching, sneezing for which  Benadryl works fine.   PHYSICAL EXAMINATION:  This is an ambulatory white male with classic  voice fatigue. He is afebrile with stable vital signs except for a blood  pressure of 170/80  after not taking his lisinopril this morning.  HEENT: Reveals no evidence of excessive post-nasal drainage or  cobblestoning. No tonsillar enlargement and nasal turbinates reveal  minimum amount of unspecific turbinate edema. Ear canals are clear  bilaterally.  NECK: Supple without cervical adenopathy or tenderness. Trachea is  midline without thyromegaly.  LUNGS: Lung fields are perfectly clear bilaterally to auscultation and  percussion except for minimum pseudo wheeze.  HEART: Regular rate and rhythm without murmur, gallop or rub present.  Hoover sign is positive at the end of inspiration.  ABDOMEN: Soft, benign.  EXTREMITIES: Warm without calf tenderness, cyanosis, clubbing or edema.   Hemoglobin saturation is 96% on room air.   IMPRESSION:  Is new onset choking that occurred within two months of  stopping smoking. Most likely is not related to chronic obstructive  pulmonary disease or asthma since he did not have symptoms while he was  smoking. The more likely problem is one of either reflux, ACE inhibitor  induced upper airways  instability (VCD) and therefore recommended that  he stop lisinopril. At this point, Advair actually may be contributing  to the problem as well and I have asked him to stop that.   Most patients with such severe choking have reflux, but the reflux  flares up out of control symptomatically in the setting of ACE inhibitor  exposure which interferes with bradykinin metabolism.   I therefore recommended that he use Zegerid, but only 40 mg daily for  the next 15 days and then stop it (samples given) and instead of  lisinopril use Benicar 20 mg daily until he returns here in 4 to 6 weeks  for PFTs.   It is certainly fine if he wants to use Benadryl for itching, sneezing  and runny nose, but Claritin would probably be a better choice for a  respiratory patient. If he is coughing, I would like him to use Delsym.  If he is short of breath I would like him to  use Pro Air two puffs every  four hours and spent extra time teaching him how to use it. If in fact  he has underlying asthma, it may well be that his symptoms flare, but on  the short run can be controlled with Pro Air which will not be as  irritating to the upper airways as Advair discus tends to be.     Charlaine Dalton. Sherene Sires, MD, [facility]  Electronically Signed    MBW/MedQ  DD: 03/23/2007  DT: 03/23/2007  Job #: [id number]   cc:   Ernestina Penna, M.D.

## 2011-05-08 NOTE — Assessment & Plan Note (Signed)
[facility]ALTHCARE                             PULMONARY OFFICE NOTE   NAME:Craig Arnold, Craig Arnold                         MRN:          [id number]  DATE:04/25/2007                            DOB:          Dec 07, 1956    PULMONARY/SUMMARY EXTENDED FOLLOW-UP OFFICE VISIT:   HISTORY:  This is a 55 year old white male seen on March 23, 2007, with a  symptom of getting choked up to the point where he could not breathe.  I thought this was probably an ACE inhibitor problem and brought him  back today for follow-up off of ACE inhibitors and taking Benicar 20 mg  one daily.  I asked him to stop all of his inhalers, which he did, but  found that he needed ProAir back on a p.r.n. basis and has now developed  what amounts to a productive cough over the last several weeks of bright  green and yellow sputum, especially when he lies down at night.  He  denies any pleuritic pain or fever, exertional chest pain, orthopnea,  PND, fever or history of hemoptysis or obvious reflux.   PHYSICAL EXAMINATION:  GENERAL:  He is a stoic, ambulatory white male in  no acute distress.  His phonation is much stronger than on previous  visits.  VITAL SIGNS:  Stable vital signs.  HEENT:  Moderate turbinate edema.  Oropharynx is clear.  No evidence of  purulent or postnasal drainage.  CARDIAC:  There is a regular rhythm without murmur, gallop, or rub.  LUNGS:  Lung fields reveal inspiratory and expiratory rhonchi  bilaterally.  ABDOMEN:  Soft, benign.  EXTREMITIES:  Warm without calf tenderness, cyanosis, clubbing or edema.   PFTs today indicate an FEV1 of 49% of predicted with an 18% improvement  after bronchodilators.   IMPRESSION:  1. Chronic obstructive pulmonary disease with a definite asthmatic      component, status post smoking cessation nearly a year ago.  It is      unlikely that the asthmatic component will resolve on its own and      is more striking than I previously appreciated.   I believe the      Advair benefit was masqueraded by the adverse effect on the upper      airway on his previous visit, and therefore will abandon Advair in      the hopes of a different combination regimen, namely Symbicort      80/4.5 mcg (which would have minimum effects on the upper airway)      dosed two puffs b.i.d.  I spent extra time coaching him on optimal      HFA technique, which he mastered fairly well.  2. His blood pressure is doing well off of ACE inhibitors.  I have      asked him to continue Benicar 20 mg daily.  3. For cough I would like him to use Mucinex DM one to two every 12      hours.  For breakthrough wheezing and dyspnea I would recommend      albuterol two puffs every 4 hours  to make sure he could use the      ProAir effectively and distinguish it between Symbicort.  4. For itching, sneezing and runny nose, can use Claritin 10 mg one      daily.  5. Because he has active purulent sputum and is wheezing, I      recommended a 10-day course      of Augmentin and prednisone and distinguished this short-term      therapy between the maintenance and p.r.n.'s on the opposite side      of the instruction sheet.   Follow-up will be in 2 weeks, sooner if needed.     Charlaine Dalton. Sherene Sires, MD, [facility]  Electronically Signed    MBW/MedQ  DD: 04/25/2007  DT: 04/26/2007  Job #: [id number]

## 2011-05-11 ENCOUNTER — Encounter: Payer: Self-pay | Admitting: Emergency Medicine

## 2011-05-15 ENCOUNTER — Other Ambulatory Visit (INDEPENDENT_AMBULATORY_CARE_PROVIDER_SITE_OTHER): Payer: 59

## 2011-05-15 ENCOUNTER — Ambulatory Visit (INDEPENDENT_AMBULATORY_CARE_PROVIDER_SITE_OTHER): Payer: 59 | Admitting: Emergency Medicine

## 2011-05-15 ENCOUNTER — Encounter: Payer: Self-pay | Admitting: Emergency Medicine

## 2011-05-15 ENCOUNTER — Ambulatory Visit (INDEPENDENT_AMBULATORY_CARE_PROVIDER_SITE_OTHER): Payer: 59

## 2011-05-15 DIAGNOSIS — J45909 Unspecified asthma, uncomplicated: Secondary | ICD-10-CM

## 2011-05-15 DIAGNOSIS — J309 Allergic rhinitis, unspecified: Secondary | ICD-10-CM

## 2011-05-15 DIAGNOSIS — J479 Bronchiectasis, uncomplicated: Secondary | ICD-10-CM

## 2011-05-15 LAB — CBC WITH DIFFERENTIAL/PLATELET
Basophils Relative: 0.5 % (ref 0.0–3.0)
Eosinophils Absolute: 1 10*3/uL — ABNORMAL HIGH (ref 0.0–0.7)
Eosinophils Relative: 11 % — ABNORMAL HIGH (ref 0.0–5.0)
Lymphocytes Relative: 16.7 % (ref 12.0–46.0)
MCHC: 34.9 g/dL (ref 30.0–36.0)
MCV: 93.8 fl (ref 78.0–100.0)
Monocytes Absolute: 0.7 10*3/uL (ref 0.1–1.0)
Neutrophils Relative %: 64.4 % (ref 43.0–77.0)
Platelets: 230 10*3/uL (ref 150.0–400.0)
RBC: 5.27 Mil/uL (ref 4.22–5.81)
WBC: 9.4 10*3/uL (ref 4.5–10.5)

## 2011-05-15 LAB — IGE: IgE (Immunoglobulin E), Serum: 151.1 IU/mL (ref 0.0–180.0)

## 2011-05-15 NOTE — Assessment & Plan Note (Signed)
Will check CBC and IgE today I would like to get him on trial of xolair if at all possible Pred 5mg  qd x 1 month, then back to qod Symbicort bid ROV in 1 mon

## 2011-05-15 NOTE — Assessment & Plan Note (Signed)
Loratadine Allergy shots q week

## 2011-05-15 NOTE — Progress Notes (Signed)
Craig Arnold is a 55 year old man with COPD, chronic cough with chronic postnasal drip, and bronchodilator responsiveness.  Also has documented [facility], completed  therapy (3/09 - 10/09). Follow up sputum negative. Repeat 12/09 showed AFB negative, fungal negative normal flora. Have considered him for Xolair but IgE and eosinophils low in the past.   CT scan of the chest 10/09 showed improvement in nodular infiltrates. Then 12/09 and 08/2010 repeat CTs showed stable mild bronchiectasis and emphysematous changes.  Repeat sputum 12/10 showed AFB negative, fungal negative, normal flora  ROV 07/30/10 -- Hx bronchiectasis and asthma. Was seen in 6/11 for worsening cough, wheeze. Rx for exac with increased pred and Augmentin. Improved but still with cough and wheeze. Still on Pred 5 every other day ROV 08/29/10 -- returns for asthma, bronchiectasis, dyspnea. Also w hx MAIC.   ROV 10/17/10 -- Hx as above, on every other day pred 5mg . Since last visit seen by [name] re: allergic component to asthma control. His serum eval has been negative, but I want hime to get skin testing. Also has had CT scan that is stable as detailed below. Still with every day wheeze, daily cough prod of thick whitish mucous. Uses ProAir two times a day.  Skin test- POS Grass, Weed, Tree  ROV 12/18/10 -- COPD/asthma, dififcult to control. Has had skin testing w [name], about to start immunotherapy. Currently on Pred 5mg  every other day. He tried Daliresp x 3 days, but lost all appetite, so he stopped it. Unclear whether it helped. Still on Symbicort two times a day, uses DuoNebs 2 -3 x a day.   December 01, 2010-  Bronchiectasis, hx MAIC, sinusitis,  Nurse-CC: Follow up visit after Allergy test on Friday. Comes to review allergy testing results and make treatment choices.  We discussed allergy vaccine and after full discussion of risk and goals including anaphyllaxis, he wants to try. We also discussed a trial of Daliresp.   January 30, 2011- -  Bronchiectasis, hx MAIC, sinusitis..................grandson here Nurse-CC: 2 month follow up visit-allergies; increased thick congestion-mucinex not helping. Continues building vaccine w/o problems. Noting cough and thick white mucus, occ cough till retch and can't sleep.Denies fever, blood, purulent, chest pain, N&V. He heard about Zpak at barber shop and wants to try.   February 06, 2011 --Presents for an acute office visit.Compalins of wheezing, rattling in chest, DOE, prod cough with white/yellow/green mucus x3days Was seen 1 week ago and given zpak that he states did not help. Cough and wheezig worse this am.Denies chest pain,  orthopnea, hemoptysis, fever, n/v/d, edema, headache. CXR today with no acute process. Mucinex not helping. Worse cough at night. Wears out walking.   ROV 03/05/11 -- f/ bronchiectasis, moderate persistant asthma and COPD. Seen 2/17 as above and rx for acute flare. Now back to pred 5mg  every other day. Started allergy shots since our last visit, still working up to final dosing. Not sure he can tell any difference yet. Taking mucinex and claritin. Feels like breathing, wheezing and cough are back to baseline.   ROV 05/15/11 -- moderate persistsant asthma + COPD, bronchiectasis. Also hx AFB (treated). Continues to have cough, more wheezing, more exertional dyspnea. He is on pred 5mg  qod, allergy shots q week. He can't tell any difference on the allergy shots. He is using loratadine + mucinex. He is coughing up pale white phlegm. Uses flutter regularly. HWe have planned to repeat CT scan in 08/2011 to follow bronchiectasis.

## 2011-05-15 NOTE — Assessment & Plan Note (Signed)
Planning for repeat CT scan in Sept 2012 to follow bronchiectasis

## 2011-05-15 NOTE — Patient Instructions (Signed)
Bloodwork today Continue your Symbicort twice a day Increase prednisone to 5mg  EVERY DAY for the next month, then go back to every other day Continue your loratadine and mucinex Use you flutter valve We will repeat your CT scan in September 2012 Follow with [name] in 1 month

## 2011-05-22 ENCOUNTER — Ambulatory Visit (INDEPENDENT_AMBULATORY_CARE_PROVIDER_SITE_OTHER): Payer: 59

## 2011-05-22 DIAGNOSIS — J309 Allergic rhinitis, unspecified: Secondary | ICD-10-CM

## 2011-05-29 ENCOUNTER — Ambulatory Visit (INDEPENDENT_AMBULATORY_CARE_PROVIDER_SITE_OTHER): Payer: 59

## 2011-05-29 DIAGNOSIS — J309 Allergic rhinitis, unspecified: Secondary | ICD-10-CM

## 2011-06-02 ENCOUNTER — Other Ambulatory Visit: Payer: Self-pay | Admitting: Emergency Medicine

## 2011-06-05 ENCOUNTER — Encounter: Payer: Self-pay | Admitting: Internal Medicine

## 2011-06-05 ENCOUNTER — Ambulatory Visit (INDEPENDENT_AMBULATORY_CARE_PROVIDER_SITE_OTHER): Payer: 59

## 2011-06-05 ENCOUNTER — Ambulatory Visit (INDEPENDENT_AMBULATORY_CARE_PROVIDER_SITE_OTHER): Payer: 59 | Admitting: Internal Medicine

## 2011-06-05 VITALS — BP 140/96 | HR 75 | Ht 73.0 in | Wt 231.6 lb

## 2011-06-05 DIAGNOSIS — J309 Allergic rhinitis, unspecified: Secondary | ICD-10-CM

## 2011-06-05 DIAGNOSIS — J479 Bronchiectasis, uncomplicated: Secondary | ICD-10-CM

## 2011-06-05 DIAGNOSIS — J45909 Unspecified asthma, uncomplicated: Secondary | ICD-10-CM

## 2011-06-05 MED ORDER — ROFLUMILAST 500 MCG PO TABS: 500.0000 ug | ORAL_TABLET | Freq: Every day | ORAL | Status: DC

## 2011-06-05 NOTE — Assessment & Plan Note (Signed)
Still not clear how important atopy is to his symptoms. We will complete his allergy vaccine build-up and watch for effect.

## 2011-06-05 NOTE — Progress Notes (Signed)
Subjective:    Patient ID: Craig Arnold, male    DOB: [birth year], 55 y.o.   MRN: [id number]  HPI    Review of Systems     Objective:   Physical Exam        Assessment & Plan:  Craig Arnold is a 55 year old man with COPD, chronic cough with chronic postnasal drip, and bronchodilator responsiveness.  Also has documented [facility], completed  therapy (3/09 - 10/09). Follow up sputum negative. Repeat 12/09 showed AFB negative, fungal negative normal flora. Have considered him for Xolair but IgE and eosinophils low in the past.   CT scan of the chest 10/09 showed improvement in nodular infiltrates. Then 12/09 and 08/2010 repeat CTs showed stable mild bronchiectasis and emphysematous changes.  Repeat sputum 12/10 showed AFB negative, fungal negative, normal flora  ROV 07/30/10 -- Hx bronchiectasis and asthma. Was seen in 6/11 for worsening cough, wheeze. Rx for exac with increased pred and Augmentin. Improved but still with cough and wheeze. Still on Pred 5 every other day ROV 08/29/10 -- returns for asthma, bronchiectasis, dyspnea. Also w hx MAIC.   ROV 10/17/10 -- Hx as above, on every other day pred 5mg . Since last visit seen by [name] re: allergic component to asthma control. His serum eval has been negative, but I want hime to get skin testing. Also has had CT scan that is stable as detailed below. Still with every day wheeze, daily cough prod of thick whitish mucous. Uses ProAir two times a day.  Skin test- POS Grass, Weed, Tree  ROV 12/18/10 -- COPD/asthma, dififcult to control. Has had skin testing w [name], about to start immunotherapy. Currently on Pred 5mg  every other day. He tried Daliresp x 3 days, but lost all appetite, so he stopped it. Unclear whether it helped. Still on Symbicort two times a day, uses DuoNebs 2 -3 x a day.   December 01, 2010-  Bronchiectasis, hx MAIC, sinusitis,  Nurse-CC: Follow up visit after Allergy test on Friday. Comes to review allergy testing results and  make treatment choices.  We discussed allergy vaccine and after full discussion of risk and goals including anaphyllaxis, he wants to try. We also discussed a trial of Daliresp.   January 30, 2011- -  Bronchiectasis, hx MAIC, sinusitis..................grandson here Nurse-CC: 2 month follow up visit-allergies; increased thick congestion-mucinex not helping. Continues building vaccine w/o problems. Noting cough and thick white mucus, occ cough till retch and can't sleep.Denies fever, blood, purulent, chest pain, N&V. He heard about Zpak at barber shop and wants to try.   February 06, 2011 --Presents for an acute office visit.Comlains of wheezing, rattling in chest, DOE, prod cough with white/yellow/green mucus x3days Was seen 1 week ago and given zpak that he states did not help. Cough and wheezig worse this am.Denies chest pain,  orthopnea, hemoptysis, fever, n/v/d, edema, headache. CXR today with no acute process. Mucinex not helping. Worse cough at night. Wears out walking.   ROV 03/05/11 -- f/ bronchiectasis, moderate persistant asthma and COPD. Seen 2/17 as above and rx for acute flare. Now back to pred 5mg  every other day. Started allergy shots since our last visit, still working up to final dosing. Not sure he can tell any difference yet. Taking mucinex and claritin. Feels like breathing, wheezing and cough are back to baseline.   ROV 05/15/11 -- moderate persistsant asthma + COPD, bronchiectasis. Also hx AFB (treated). Continues to have cough, more wheezing, more exertional dyspnea. He is on pred 5mg   qod, allergy shots q week. He can't tell any difference on the allergy shots. He is using loratadine + mucinex. He is coughing up pale white phlegm. Uses flutter regularly. HWe have planned to repeat CT scan in 08/2011 to follow bronchiectasis.   06/05/11- Moderate/ persistent asthma/ COPD/ bronchiectasis, Hx treated MAIC, sinusitis, complicated by GERD, rhinitis Began allergy vaccine in December -  now at 1:50 GH. He sees little change in breathing, with variable wheeze and cough, sometimes productive of white mucus with no blood or purulence. Previously stopped Daliresp after 3 day trial when it reduced appetite- discussed another trial. Sneezes frequently.

## 2011-06-05 NOTE — Assessment & Plan Note (Signed)
CT in September had shown scarring with some bronchiectasis in the right mid and lower lung zone. This is the source of much of his cough and phlegm, although he feels the wheeze is mostly from his left lung.  We are going to retry Daliresp with very slow restart to see if he can get used to it to reduce inflammation.

## 2011-06-05 NOTE — Patient Instructions (Signed)
I will contact the allergy lab to advance your vaccine one more step stronger with the next time you order.   Sample/ script Daliresp for another try. We hope with time, this can reduce cough and inflammation in your lungs.  Try taking one every other day, after a meal.

## 2011-06-12 ENCOUNTER — Ambulatory Visit (INDEPENDENT_AMBULATORY_CARE_PROVIDER_SITE_OTHER): Payer: 59

## 2011-06-12 DIAGNOSIS — J309 Allergic rhinitis, unspecified: Secondary | ICD-10-CM

## 2011-06-15 ENCOUNTER — Ambulatory Visit (INDEPENDENT_AMBULATORY_CARE_PROVIDER_SITE_OTHER): Payer: 59

## 2011-06-15 DIAGNOSIS — J309 Allergic rhinitis, unspecified: Secondary | ICD-10-CM

## 2011-06-17 ENCOUNTER — Encounter: Payer: Self-pay | Admitting: Emergency Medicine

## 2011-06-17 ENCOUNTER — Other Ambulatory Visit: Payer: 59

## 2011-06-17 ENCOUNTER — Ambulatory Visit (INDEPENDENT_AMBULATORY_CARE_PROVIDER_SITE_OTHER): Payer: 59 | Admitting: Emergency Medicine

## 2011-06-17 VITALS — BP 136/98 | HR 89 | Temp 98.0°F | Ht 73.0 in | Wt 231.8 lb

## 2011-06-17 DIAGNOSIS — J45909 Unspecified asthma, uncomplicated: Secondary | ICD-10-CM

## 2011-06-17 NOTE — Assessment & Plan Note (Signed)
Continues to have poor control, daily symptoms. On pred and maximum BD's, allergy shots. IgE and eosinophils elevated.  - will try Xolair - continue pred 5mg  qd for now as well as other regimen - serum precipitans to r/o ABPA (sputum negative for fungus)

## 2011-06-17 NOTE — Progress Notes (Signed)
Craig Arnold is a 55 year old man with COPD, chronic cough with chronic postnasal drip, and bronchodilator responsiveness.  Also has documented [facility], completed  therapy (3/09 - 10/09). Follow up sputum negative. Repeat 12/09 showed AFB negative, fungal negative normal flora. Have considered him for Xolair but IgE and eosinophils low in the past.   CT scan of the chest 10/09 showed improvement in nodular infiltrates. Then 12/09 and 08/2010 repeat CTs showed stable mild bronchiectasis and emphysematous changes.  Repeat sputum 12/10 showed AFB negative, fungal negative, normal flora  ROV 07/30/10 -- Hx bronchiectasis and asthma. Was seen in 6/11 for worsening cough, wheeze. Rx for exac with increased pred and Augmentin. Improved but still with cough and wheeze. Still on Pred 5 every other day ROV 08/29/10 -- returns for asthma, bronchiectasis, dyspnea. Also w hx MAIC.   ROV 10/17/10 -- Hx as above, on every other day pred 5mg . Since last visit seen by Dr Craig Arnold re: allergic component to asthma control. His serum eval has been negative, but I want hime to get skin testing. Also has had CT scan that is stable as detailed below. Still with every day wheeze, daily cough prod of thick whitish mucous. Uses ProAir two times a day.  Skin test- POS Grass, Weed, Tree  ROV 12/18/10 -- COPD/asthma, dififcult to control. Has had skin testing w Dr Craig Arnold, about to start immunotherapy. Currently on Pred 5mg  every other day. He tried Daliresp x 3 days, but lost all appetite, so he stopped it. Unclear whether it helped. Still on Symbicort two times a day, uses DuoNebs 2 -3 x a day.   December 01, 2010-  Bronchiectasis, hx MAIC, sinusitis,  Nurse-CC: Follow up visit after Allergy test on Friday. Comes to review allergy testing results and make treatment choices.  We discussed allergy vaccine and after full discussion of risk and goals including anaphyllaxis, he wants to try. We also discussed a trial of Daliresp.   January 30, 2011- -  Bronchiectasis, hx MAIC, sinusitis..................grandson here Nurse-CC: 2 month follow up visit-allergies; increased thick congestion-mucinex not helping. Continues building vaccine w/o problems. Noting cough and thick white mucus, occ cough till retch and can't sleep.Denies fever, blood, purulent, chest pain, N&V. He heard about Zpak at barber shop and wants to try.   February 06, 2011 --Presents for an acute office visit.Compalins of wheezing, rattling in chest, DOE, prod cough with white/yellow/green mucus x3days Was seen 1 week ago and given zpak that he states did not help. Cough and wheezing worse this am.Denies chest pain,  orthopnea, hemoptysis, fever, n/v/d, edema, headache. CXR today with no acute process. Mucinex not helping. Worse cough at night. Wears out walking.   ROV 03/05/11 -- f/ bronchiectasis, moderate persistant asthma and COPD. Seen 2/17 as above and rx for acute flare. Now back to pred 5mg  every other day. Started allergy shots since our last visit, still working up to final dosing. Not sure he can tell any difference yet. Taking mucinex and claritin. Feels like breathing, wheezing and cough are back to baseline.   ROV 05/15/11 -- moderate persistsant asthma + COPD, bronchiectasis. Also hx AFB (treated). Continues to have cough, more wheezing, more exertional dyspnea. He is on pred 5mg  qod, allergy shots q week. He can't tell any difference on the allergy shots. He is using loratadine + mucinex. He is coughing up pale white phlegm. Uses flutter regularly. HWe have planned to repeat CT scan in 08/2011 to follow bronchiectasis.   ROV 06/17/11 -- moderate  persistsant asthma + COPD, bronchiectasis. Also hx MAIC (treated). Getting allergy shots q month. He is on Pred 5mg  qd, about to change to qod. I don't see that he has has serum precipitans.   Gen: Pleasant, well-nourished, in no distress,  normal affect, stutters  ENT: No lesions,  mouth clear,  oropharynx clear, no  postnasal drip  Neck: No JVD, no TMG, no carotid bruits  Lungs: severe diffuse wheezing all fields  Cardiovascular: RRR, heart sounds normal, no murmur or gallops, no peripheral edema  Musculoskeletal: No deformities, no cyanosis or clubbing  Neuro: alert, non focal  Skin: Warm, no lesions or rashes   ASTHMA Continues to have poor control, daily symptoms. On pred and maximum BD's, allergy shots. IgE and eosinophils elevated.  - will try Xolair - continue pred 5mg  qd for now as well as other regimen - serum precipitans to r/o ABPA (sputum negative for fungus)

## 2011-06-17 NOTE — Progress Notes (Signed)
Addended by: [name] A on: 06/17/2011 04:11 PM   Modules accepted: Orders

## 2011-06-17 NOTE — Patient Instructions (Signed)
Continue your current medications, including Prednisone 5mg  daily We will check blood work today We will start the process to initiate Xolair Follow up with [name] in 1 month

## 2011-06-19 ENCOUNTER — Ambulatory Visit (INDEPENDENT_AMBULATORY_CARE_PROVIDER_SITE_OTHER): Payer: 59

## 2011-06-19 DIAGNOSIS — J309 Allergic rhinitis, unspecified: Secondary | ICD-10-CM

## 2011-06-20 LAB — FUNGAL ANTIBODIES PANEL, ID-BLOOD
Aspergillus Flavus Antibodies: NEGATIVE
Aspergillus Niger Antibodies: NEGATIVE

## 2011-06-23 ENCOUNTER — Telehealth: Payer: Self-pay | Admitting: Internal Medicine

## 2011-06-29 ENCOUNTER — Ambulatory Visit (INDEPENDENT_AMBULATORY_CARE_PROVIDER_SITE_OTHER): Payer: 59

## 2011-06-29 DIAGNOSIS — J309 Allergic rhinitis, unspecified: Secondary | ICD-10-CM

## 2011-06-29 NOTE — Telephone Encounter (Signed)
1st Xolair appointment scheduled for 07/22/11 at 1:45 placed on Dr. Kavin Leech schedule. Pt is aware of appt date and time. Pt is also aware that he must wait 2 hours for this 1st injection.

## 2011-07-03 ENCOUNTER — Ambulatory Visit (INDEPENDENT_AMBULATORY_CARE_PROVIDER_SITE_OTHER): Payer: 59

## 2011-07-03 DIAGNOSIS — J309 Allergic rhinitis, unspecified: Secondary | ICD-10-CM

## 2011-07-09 ENCOUNTER — Encounter: Payer: Self-pay | Admitting: Internal Medicine

## 2011-07-10 ENCOUNTER — Ambulatory Visit (INDEPENDENT_AMBULATORY_CARE_PROVIDER_SITE_OTHER): Payer: 59

## 2011-07-10 DIAGNOSIS — J309 Allergic rhinitis, unspecified: Secondary | ICD-10-CM

## 2011-07-17 ENCOUNTER — Ambulatory Visit (INDEPENDENT_AMBULATORY_CARE_PROVIDER_SITE_OTHER): Payer: 59

## 2011-07-17 DIAGNOSIS — J309 Allergic rhinitis, unspecified: Secondary | ICD-10-CM

## 2011-07-20 ENCOUNTER — Encounter: Payer: Self-pay | Admitting: Emergency Medicine

## 2011-07-20 ENCOUNTER — Ambulatory Visit (INDEPENDENT_AMBULATORY_CARE_PROVIDER_SITE_OTHER): Payer: 59 | Admitting: Emergency Medicine

## 2011-07-20 DIAGNOSIS — J449 Chronic obstructive pulmonary disease, unspecified: Secondary | ICD-10-CM

## 2011-07-20 NOTE — Assessment & Plan Note (Addendum)
Will continue BD's  Increase Daliresp  He can't afford the Xolair, doesn't plan to take it today. Need to review the financial options today to see if this will be a possibility.  O2 as ordered  ROV

## 2011-07-20 NOTE — Progress Notes (Signed)
Craig Arnold is a 55 year old man with COPD, chronic cough with chronic postnasal drip, and bronchodilator responsiveness.  Also has documented [facility], completed  therapy (3/09 - 10/09). Follow up sputum negative. Repeat 12/09 showed AFB negative, fungal negative normal flora. Have considered him for Xolair but IgE and eosinophils low in the past.   CT scan of the chest 10/09 showed improvement in nodular infiltrates. Then 12/09 and 08/2010 repeat CTs showed stable mild bronchiectasis and emphysematous changes.  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 Continues building vaccine w/o problems. Noting cough and thick white mucus, occ cough till retch and can't sleep.Denies fever, blood, purulent, chest pain, N&V. He heard about Zpak at barber shop and wants to try.   February 06, 2011 --Presents for an acute office visit.Compalins of wheezing, rattling in chest, DOE, prod cough with white/yellow/green mucus x3days Was seen 1 week ago and given zpak that he states did not help. Cough and wheezing worse this am.Denies chest pain,  orthopnea, hemoptysis, fever, n/v/d, edema, headache. CXR today with no acute process. Mucinex not helping. Worse cough at night. Wears out walking.   ROV 03/05/11 -- f/ bronchiectasis, moderate persistant asthma and COPD. Seen 2/17 as above and rx for acute flare. Now back to pred 5mg  every other day. Started allergy shots since our last visit (grass/weeds/trees), still working up to final dosing. Not sure he can tell any difference yet. Taking mucinex and claritin. Feels like breathing, wheezing and cough are back to baseline.   ROV 05/15/11 -- moderate persistsant asthma + COPD, bronchiectasis. Also hx AFB (treated). Continues to have cough, more wheezing, more exertional  dyspnea. He is on pred 5mg  qod, allergy shots q week. He can't tell any difference on the allergy shots. He is using loratadine + mucinex. He is coughing up pale white phlegm. Uses flutter regularly. HWe have planned to repeat CT scan in 08/2011 to follow bronchiectasis.   ROV 06/17/11 -- moderate persistsant asthma + COPD, bronchiectasis. Also hx MAIC (treated). Getting allergy shots q month. He is on Pred 5mg  qd, about to change to qod. I don't see that he has has serum precipitans.   ROV 07/20/11 -- moderate persistsant asthma + COPD, bronchiectasis. Also hx MAIC (treated). Maintained on Pred 5mg  qd. On daliresp qod - was started by [name]. He isn't sure it has helped him in any way. He is scheduled to get first injection of Xolair today, but he is concerned that he cannot pay his 20%, needs to confirm what [facility] will pay, whether this will be better after disability. Serum precipitans negative.    EXAM:  Gen: Pleasant, obese, in no distress,  normal affect, stutters  ENT: No lesions,  mouth clear,  oropharynx clear, no postnasal drip  Neck: No JVD, no TMG, no carotid bruits  Lungs: severe diffuse wheezing all fields  Cardiovascular: RRR, heart sounds normal, no murmur or gallops, no peripheral edema  Musculoskeletal: No deformities, no cyanosis or clubbing  Neuro: alert, non focal  Skin: Warm, no lesions or rashes   COPD Will continue BD's  Increase Daliresp  He can't afford the Xolair, doesn't plan to take it today. Need to review the financial options today to see if this will be a possibility.  O2 as ordered  ROV

## 2011-07-20 NOTE — Patient Instructions (Addendum)
Please continue Symbicort twice a day Continue your oxygen and nebulizers as you are using them Increase your Daliresp to 1 tablet every day.  Continue your allergy shots  Follow up with [name] in 1 month

## 2011-07-24 ENCOUNTER — Ambulatory Visit: Payer: 59 | Admitting: Emergency Medicine

## 2011-07-24 ENCOUNTER — Ambulatory Visit (INDEPENDENT_AMBULATORY_CARE_PROVIDER_SITE_OTHER): Payer: 59

## 2011-07-24 DIAGNOSIS — J309 Allergic rhinitis, unspecified: Secondary | ICD-10-CM

## 2011-07-27 ENCOUNTER — Telehealth: Payer: Self-pay | Admitting: Emergency Medicine

## 2011-07-27 NOTE — Telephone Encounter (Signed)
I called and spoke with Latoya today (07/27/11) at [facility]. The xolair was shipped by [facility] on 06/29/11. [facility] spoke with Mr. Dennington on 06/23/11 and obtained consent to ship Xolair. Per Glee Arvin, [facility] will not be able to accept Xolair back and credit account. Once Geoffry Paradise has been shipped, pharmacies are not allowed to take them back. Patient has the copay assistance card in place that should pay 80% of this balance and xolair has been D/C. Patient should not receive any more calls about any additional orders for xolair. Called and spoke with Mr. Conly and advised that [facility] will not take this medication back because they spoke with him and he ok the shipment. Patient has an estimated bill from [facility]. Patient has received the copay assistance card to help with this bill. I advised Mr. Berneice Gandy to call [facility] and give them this information.

## 2011-07-27 NOTE — Telephone Encounter (Signed)
Pt says he received a bill for his Xolair this weekend and it was for $500+. I told him that I will forward the msg to [facility] because she will be able to answer any questions he has regarding Xolair since she deals with this on a daily basis. Dr. Delton Coombes has decided NOT to start this patient on Xolair due to cost for the patient. Bjorn Loser, is there any way we can ship this back? Pls advise. Pt can be reached on his cell at [phone].

## 2011-07-27 NOTE — Telephone Encounter (Signed)
Called patient back and he stated that he called [facility] back and gave them the copay assistance card number and they advised patient that they would process this and would send him an updated statement. Advised patient that I would close this message out and to let me know if he had any problems once this is processed.

## 2011-08-07 ENCOUNTER — Ambulatory Visit (INDEPENDENT_AMBULATORY_CARE_PROVIDER_SITE_OTHER): Payer: 59

## 2011-08-07 DIAGNOSIS — J309 Allergic rhinitis, unspecified: Secondary | ICD-10-CM

## 2011-08-14 ENCOUNTER — Ambulatory Visit: Payer: 59

## 2011-08-14 ENCOUNTER — Encounter: Payer: Self-pay | Admitting: Emergency Medicine

## 2011-08-14 ENCOUNTER — Ambulatory Visit (INDEPENDENT_AMBULATORY_CARE_PROVIDER_SITE_OTHER): Payer: 59

## 2011-08-14 ENCOUNTER — Ambulatory Visit (INDEPENDENT_AMBULATORY_CARE_PROVIDER_SITE_OTHER): Payer: 59 | Admitting: Emergency Medicine

## 2011-08-14 VITALS — BP 130/78 | HR 82 | Temp 97.7°F | Ht 73.0 in | Wt 242.0 lb

## 2011-08-14 DIAGNOSIS — J309 Allergic rhinitis, unspecified: Secondary | ICD-10-CM

## 2011-08-14 DIAGNOSIS — J449 Chronic obstructive pulmonary disease, unspecified: Secondary | ICD-10-CM

## 2011-08-14 MED ORDER — PREDNISONE 10 MG PO TABS: 10.0000 mg | ORAL_TABLET | Freq: Three times a day (TID) | ORAL | Status: AC

## 2011-08-14 NOTE — Progress Notes (Signed)
Craig Arnold is a 55 year old man with COPD, chronic cough with chronic postnasal drip, and bronchodilator responsiveness.  Also has documented Craig Arnold, completed  therapy (3/09 - 10/09). Follow up sputum negative. Repeat 12/09 showed AFB negative, fungal negative normal flora. Have considered him for Xolair but IgE and eosinophils low in the past.   CT scan of the chest 10/09 showed improvement in nodular infiltrates. Then 12/09 and 08/2010 repeat CTs showed stable mild bronchiectasis and emphysematous changes.  Repeat sputum 12/10 showed AFB negative, fungal negative, normal flora  January 30, 2011- -  Bronchiectasis, hx MAIC, sinusitis..................grandson here Craig Arnold: 2 month follow up visit-allergies; increased thick congestion-mucinex not helping. Continues building vaccine w/o problems. Noting cough and thick white mucus, occ cough till retch and can't sleep.Denies fever, blood, purulent, chest pain, N&V. He heard about Zpak at barber shop and wants to try.   February 06, 2011 --Presents for an acute office visit.Compalins of wheezing, rattling in chest, DOE, prod cough with white/yellow/green mucus x3days Was seen 1 week ago and given zpak that he states did not help. Cough and wheezing worse this am.Denies chest pain,  orthopnea, hemoptysis, fever, n/v/d, edema, headache. CXR today with no acute process. Mucinex not helping. Worse cough at night. Wears out walking.   ROV 03/05/11 -- f/ bronchiectasis, moderate persistant asthma and COPD. Seen 2/17 as above and rx for acute flare. Now back to pred 5mg  every other day. Started allergy shots since our last visit (grass/weeds/trees), still working up to final dosing. Not sure he can tell any difference yet. Taking mucinex and claritin. Feels like breathing, wheezing and cough are back to baseline.   ROV 05/15/11 -- moderate persistsant asthma + COPD, bronchiectasis. Also hx AFB (treated). Continues to have cough, more wheezing, more exertional  dyspnea. He is on pred 5mg  qod, allergy shots q week. He can't tell any difference on the allergy shots. He is using loratadine + mucinex. He is coughing up pale white phlegm. Uses flutter regularly. HWe have planned to repeat CT scan in 08/2011 to follow bronchiectasis.   ROV 06/17/11 -- moderate persistsant asthma + COPD, bronchiectasis. Also hx MAIC (treated). Getting allergy shots q month. He is on Pred 5mg  qd, about to change to qod. I don't see that he has has serum precipitans.   ROV 07/20/11 -- moderate persistsant asthma + COPD, bronchiectasis. Also hx MAIC (treated). Maintained on Pred 5mg  qd. On daliresp qod - was started by Craig Arnold. He isn't sure it has helped him in any way. He is scheduled to get first injection of Xolair today, but he is concerned that he cannot pay his 20%, needs to confirm what Craig Arnold will pay, whether this will be better after disability. Serum precipitans negative.   ROV 08/14/11 -- moderate persistsant asthma + COPD, bronchiectasis. Also hx MAIC (treated). Last time we deferred Xolair due to cost. We increase daliresp to qd from qod. He tells me that he has had more cough and "rattling" since last time. Breathing is about the same. He is coughing up thick white mucous. Remains on Prednisone 5mg  qd.  His wheeze is worse when he lays supine.    EXAM:  Gen: Pleasant, obese, in no distress,  normal affect, stutters  ENT: No lesions,  mouth clear,  oropharynx clear, no postnasal drip  Neck: No JVD, no TMG, no carotid bruits  Lungs: severe diffuse wheezing all fields  Cardiovascular: RRR, heart sounds normal, no murmur or gallops, no peripheral edema  Musculoskeletal: No deformities,  no cyanosis or clubbing  Neuro: alert, non focal  Skin: Warm, no lesions or rashes   COPD Continue symbicort Increase prednisone to 30mg  qd until next visit ProAir prn Continue daliresp for now but consider d/c next time He cannot pursue xolair at this time due to cost Still  taking allergy shots ROV 2 month

## 2011-08-14 NOTE — Patient Instructions (Signed)
We will temporarily increase your prednisone to 30mg  daily. Stay on this dose until you next visit Continue your Symbicort, ProAir and daliresp Continue allergy shots Follow up with [name] in 1 month

## 2011-08-14 NOTE — Assessment & Plan Note (Addendum)
Continue symbicort Increase prednisone to 30mg  qd until next visit ProAir prn Continue daliresp for now but consider d/c next time He cannot pursue xolair at this time due to cost Still taking allergy shots ROV 2 month

## 2011-09-01 ENCOUNTER — Telehealth: Payer: Self-pay | Admitting: Emergency Medicine

## 2011-09-01 NOTE — Telephone Encounter (Signed)
Pt aware that there is not a letter in his chart regarding his disability. I do show that back in 12/2010 records were sent to DSS from [employer]. I will check with [employer] tomorrow to see if they have any record of a letter that Dr. Delton Coombes may have dictated.

## 2011-09-01 NOTE — Telephone Encounter (Signed)
I spoke with pt and he states roughly about 5-6 months ago Dr. Delton Coombes wrote a letter for him and sent it to disability. Pt is now requesting a copy of this letter. I have looked in both of our systems and I did not see anything about a letter. Will forward to Lawson Fiscal to see if she remembers. Please advise Lawson Fiscal. Thanks  Carver Fila, CMA

## 2011-09-04 ENCOUNTER — Encounter: Payer: Self-pay | Admitting: Emergency Medicine

## 2011-09-04 NOTE — Telephone Encounter (Signed)
I don't see that Dr. Delton Coombes has ever dictated a letter for disability. Per Elease Hashimoto in [facility], if this had been done it would be scanned into the pt's chart. Pt is aware that we have only sent records to [employer] and his lawyer, Candace Apple regarding disability. He is requesting that Dr. Delton Coombes dictate a letter that includes why he needs disability such as his condition, diagnosis, why he cannot work or how long he will need to be out of work, etc...  Pls advise.

## 2011-09-04 NOTE — Telephone Encounter (Signed)
Returning call.

## 2011-09-04 NOTE — Telephone Encounter (Signed)
I prepared a letter in [employer] - need to know where to send it, ? To the patient, his attorney?

## 2011-09-04 NOTE — Telephone Encounter (Signed)
lmomtcb x 1  Where does pt need letter sent?

## 2011-09-07 NOTE — Telephone Encounter (Signed)
Called, spoke with pt.  States he picked up this letter this am from our office and nothing further needed at this time.

## 2011-09-11 ENCOUNTER — Ambulatory Visit: Payer: 59 | Admitting: Internal Medicine

## 2011-09-18 ENCOUNTER — Encounter: Payer: Self-pay | Admitting: Emergency Medicine

## 2011-09-18 ENCOUNTER — Ambulatory Visit (INDEPENDENT_AMBULATORY_CARE_PROVIDER_SITE_OTHER): Payer: 59 | Admitting: Emergency Medicine

## 2011-09-18 ENCOUNTER — Ambulatory Visit (INDEPENDENT_AMBULATORY_CARE_PROVIDER_SITE_OTHER): Payer: 59

## 2011-09-18 DIAGNOSIS — J45909 Unspecified asthma, uncomplicated: Secondary | ICD-10-CM

## 2011-09-18 DIAGNOSIS — J309 Allergic rhinitis, unspecified: Secondary | ICD-10-CM

## 2011-09-18 NOTE — Assessment & Plan Note (Signed)
Decrease pred to 20mg  qd and follow Same BD's Restart allergy shots if possible rov 2 months

## 2011-09-18 NOTE — Progress Notes (Signed)
Craig Arnold is a 55 year old man with COPD, chronic cough with chronic postnasal drip, and bronchodilator responsiveness.  Also has documented [facility], completed  therapy (3/09 - 10/09). Follow up sputum negative. Repeat 12/09 showed AFB negative, fungal negative normal flora. Have considered him for Xolair but IgE and eosinophils low in the past.   CT scan of the chest 10/09 showed improvement in nodular infiltrates. Then 12/09 and 08/2010 repeat CTs showed stable mild bronchiectasis and emphysematous changes.  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 Continues building vaccine w/o problems. Noting cough and thick white mucus, occ cough till retch and can't sleep.Denies fever, blood, purulent, chest pain, N&V. He heard about Zpak at barber shop and wants to try.   February 06, 2011 --Presents for an acute office visit.Compalins of wheezing, rattling in chest, DOE, prod cough with white/yellow/green mucus x3days Was seen 1 week ago and given zpak that he states did not help. Cough and wheezing worse this am.Denies chest pain,  orthopnea, hemoptysis, fever, n/v/d, edema, headache. CXR today with no acute process. Mucinex not helping. Worse cough at night. Wears out walking.   ROV 03/05/11 -- f/ bronchiectasis, moderate persistant asthma and COPD. Seen 2/17 as above and rx for acute flare. Now back to pred 5mg  every other day. Started allergy shots since our last visit (grass/weeds/trees), still working up to final dosing. Not sure he can tell any difference yet. Taking mucinex and claritin. Feels like breathing, wheezing and cough are back to baseline.   ROV 05/15/11 -- moderate persistsant asthma + COPD, bronchiectasis. Also hx AFB (treated). Continues to have cough, more wheezing, more exertional  dyspnea. He is on pred 5mg  qod, allergy shots q week. He can't tell any difference on the allergy shots. He is using loratadine + mucinex. He is coughing up pale white phlegm. Uses flutter regularly. HWe have planned to repeat CT scan in 08/2011 to follow bronchiectasis.   ROV 06/17/11 -- moderate persistsant asthma + COPD, bronchiectasis. Also hx MAIC (treated). Getting allergy shots q month. He is on Pred 5mg  qd, about to change to qod. I don't see that he has has serum precipitans.   ROV 07/20/11 -- moderate persistsant asthma + COPD, bronchiectasis. Also hx MAIC (treated). Maintained on Pred 5mg  qd. On daliresp qod - was started by [name]. He isn't sure it has helped him in any way. He is scheduled to get first injection of Xolair today, but he is concerned that he cannot pay his 20%, needs to confirm what [facility] will pay, whether this will be better after disability. Serum precipitans negative.   ROV 08/14/11 -- moderate persistsant asthma + COPD, bronchiectasis. Also hx MAIC (treated). Last time we deferred Xolair due to cost. We increase daliresp to qd from qod. He tells me that he has had more cough and "rattling" since last time. Breathing is about the same. He is coughing up thick white mucous. Remains on Prednisone 5mg  qd.  His wheeze is worse when he lays supine.   ROV 09/18/11 -- moderate persistsant asthma + COPD, bronchiectasis. Also hx MAIC (treated). Started daliresp. Last time we increased Pred to 30mg  daily to see if he would benefit. His wheeze is better. No real change in exertional tolerance. Typically coughing up clear mucous. He needs to get back to allergy shots - has missed last 3-4 visits due  to cost.    EXAM:  Gen: Pleasant, obese, in no distress,  normal affect, stutters  ENT: No lesions,  mouth clear,  oropharynx clear, no postnasal drip  Neck: No JVD, no TMG, no carotid bruits  Lungs: severe diffuse wheezing all fields  Cardiovascular: RRR, heart sounds normal, no murmur  or gallops, no peripheral edema  Musculoskeletal: No deformities, no cyanosis or clubbing  Neuro: alert, non focal  Skin: Warm, no lesions or rashes  ASTHMA Decrease pred to 20mg  qd and follow Same BD's Restart allergy shots if possible rov 2 months

## 2011-09-18 NOTE — Patient Instructions (Signed)
Please continue your inhaled medications.  Decrease your prednisone to 20mg  daily. Call our office if your breathing or wheezing worsens with the change in prednisone.  Restart your allergy shots if at all possible.  Follow with [name] in 2 months or sooner if you have any trouble

## 2011-09-25 ENCOUNTER — Ambulatory Visit (INDEPENDENT_AMBULATORY_CARE_PROVIDER_SITE_OTHER): Payer: 59

## 2011-09-25 DIAGNOSIS — J309 Allergic rhinitis, unspecified: Secondary | ICD-10-CM

## 2011-10-02 ENCOUNTER — Ambulatory Visit (INDEPENDENT_AMBULATORY_CARE_PROVIDER_SITE_OTHER): Payer: 59

## 2011-10-02 DIAGNOSIS — J309 Allergic rhinitis, unspecified: Secondary | ICD-10-CM

## 2011-10-08 ENCOUNTER — Telehealth: Payer: Self-pay | Admitting: Emergency Medicine

## 2011-10-08 NOTE — Telephone Encounter (Signed)
Called and spoke with pt and he stated that the document is from [facility] to get a handicap placard.  He is aware that RB out of the office until next week.  Once RB is back will have this completed.  Pt voiced his understanding.  lori have you seen this form for this pt?  thanks

## 2011-10-12 ENCOUNTER — Ambulatory Visit (INDEPENDENT_AMBULATORY_CARE_PROVIDER_SITE_OTHER): Payer: 59

## 2011-10-12 ENCOUNTER — Telehealth: Payer: Self-pay | Admitting: Internal Medicine

## 2011-10-12 DIAGNOSIS — J309 Allergic rhinitis, unspecified: Secondary | ICD-10-CM

## 2011-10-12 MED ORDER — PREDNISONE 10 MG PO TABS: 20.0000 mg | ORAL_TABLET | Freq: Every day | ORAL | Status: DC

## 2011-10-12 MED ORDER — IPRATROPIUM-ALBUTEROL 0.5-2.5 (3) MG/3ML IN SOLN: 3.0000 mL | Freq: Four times a day (QID) | RESPIRATORY_TRACT | Status: DC | PRN

## 2011-10-12 MED ORDER — ROFLUMILAST 500 MCG PO TABS: 500.0000 ug | ORAL_TABLET | Freq: Every day | ORAL | Status: DC

## 2011-10-12 MED ORDER — ALBUTEROL SULFATE HFA 108 (90 BASE) MCG/ACT IN AERS: 2.0000 | INHALATION_SPRAY | RESPIRATORY_TRACT | Status: DC | PRN

## 2011-10-12 NOTE — Telephone Encounter (Signed)
LMOMTCB to verify rx's needed to be sent

## 2011-10-12 NOTE — Telephone Encounter (Signed)
Called and spoke with pt. Pt requests rx for daliresp, duoneb, prednisone and proair sent to [facility] for 90 day supply.  rx sent.  Pt aware.

## 2011-10-12 NOTE — Telephone Encounter (Signed)
Pt returned Mindy's call & can be reached [phone].  Craig Arnold

## 2011-10-16 NOTE — Telephone Encounter (Signed)
DMV form is awaiting RB signature.

## 2011-10-20 NOTE — Telephone Encounter (Signed)
DMV form completed and pt is aware. He asked that we mail this to his home and his address was verified. Copy placed in RB folder for scanning.

## 2011-10-23 ENCOUNTER — Ambulatory Visit (INDEPENDENT_AMBULATORY_CARE_PROVIDER_SITE_OTHER): Payer: 59

## 2011-10-23 DIAGNOSIS — J309 Allergic rhinitis, unspecified: Secondary | ICD-10-CM

## 2011-10-30 ENCOUNTER — Ambulatory Visit (INDEPENDENT_AMBULATORY_CARE_PROVIDER_SITE_OTHER): Payer: 59

## 2011-10-30 DIAGNOSIS — J309 Allergic rhinitis, unspecified: Secondary | ICD-10-CM

## 2011-11-09 ENCOUNTER — Ambulatory Visit (INDEPENDENT_AMBULATORY_CARE_PROVIDER_SITE_OTHER): Payer: 59

## 2011-11-09 ENCOUNTER — Ambulatory Visit (INDEPENDENT_AMBULATORY_CARE_PROVIDER_SITE_OTHER): Payer: 59 | Admitting: Emergency Medicine

## 2011-11-09 ENCOUNTER — Encounter: Payer: Self-pay | Admitting: Emergency Medicine

## 2011-11-09 DIAGNOSIS — J309 Allergic rhinitis, unspecified: Secondary | ICD-10-CM

## 2011-11-09 DIAGNOSIS — J45909 Unspecified asthma, uncomplicated: Secondary | ICD-10-CM

## 2011-11-09 NOTE — Patient Instructions (Signed)
Please continue your inhaled medications as you are taking them Decrease your prednisone to 15mg  daily. Call our office if your breathing or wheezing worsens with the change in prednisone.  Continue your allergy shots as you are taking them Follow with [name] in 3 months or sooner if you have any trouble

## 2011-11-09 NOTE — Assessment & Plan Note (Signed)
Continue symbicort Try to decrease pred to 15mg  qd Allergy shots May need to reconsider xolair in future O2 with exertion rov 4 months

## 2011-11-09 NOTE — Progress Notes (Signed)
Craig Arnold is a 55 year old man with COPD, chronic cough with chronic postnasal drip, and bronchodilator responsiveness.  Also has documented [facility], completed  therapy (3/09 - 10/09). Follow up sputum negative. Repeat 12/09 showed AFB negative, fungal negative normal flora. Have considered him for Xolair but IgE and eosinophils low in the past.   CT scan of the chest 10/09 showed improvement in nodular infiltrates. Then 12/09 and 08/2010 repeat CTs showed stable mild bronchiectasis and emphysematous changes.  Repeat sputum 12/10 showed AFB negative, fungal negative, normal flora  ROV 05/15/11 -- moderate persistsant asthma + COPD, bronchiectasis. Also hx AFB (treated). Continues to have cough, more wheezing, more exertional dyspnea. He is on pred 5mg  qod, allergy shots q week. He can't tell any difference on the allergy shots. He is using loratadine + mucinex. He is coughing up pale white phlegm. Uses flutter regularly. HWe have planned to repeat CT scan in 08/2011 to follow bronchiectasis.   ROV 06/17/11 -- moderate persistsant asthma + COPD, bronchiectasis. Also hx MAIC (treated). Getting allergy shots q month. He is on Pred 5mg  qd, about to change to qod. I don't see that he has has serum precipitans.   ROV 07/20/11 -- moderate persistsant asthma + COPD, bronchiectasis. Also hx MAIC (treated). Maintained on Pred 5mg  qd. On daliresp qod - was started by [name]. He isn't sure it has helped him in any way. He is scheduled to get first injection of Xolair today, but he is concerned that he cannot pay his 20%, needs to confirm what [facility] will pay, whether this will be better after disability. Serum precipitans negative.   ROV 08/14/11 -- moderate persistsant asthma + COPD, bronchiectasis. Also hx MAIC (treated). Last time we deferred Xolair due to cost. We increase daliresp to qd from qod. He tells me that he has had more cough and "rattling" since last time. Breathing is about the same. He is coughing up thick  white mucous. Remains on Prednisone 5mg  qd.  His wheeze is worse when he lays supine.   ROV 09/18/11 -- moderate persistsant asthma + COPD, bronchiectasis. Also hx MAIC (treated). Started daliresp. Last time we increased Pred to 30mg  daily to see if he would benefit. His wheeze is better. No real change in exertional tolerance. Typically coughing up clear mucous. He needs to get back to allergy shots - has missed last 3-4 visits due to cost.   ROV 11/09/11 -- moderate persistsant asthma + COPD, bronchiectasis. Also hx MAIC (treated). Currently on pred 20 (decreased last time). he doesn't believe he lost any ground on decreased pred. Symbicort + DuoNebs prn (hasn't been needing), We have arranged allergy shots - he is back on these; he was unable to afford Xolair.  Summer is his worst time of year. His activity is limited.     EXAM:  Gen: Pleasant, obese, in no distress,  normal affect, stutters  ENT: No lesions,  mouth clear,  oropharynx clear, no postnasal drip  Neck: No JVD, no TMG, no carotid bruits  Lungs: wheezing scatter fields, improved from last visit (appears to be his chronic baseline)  Cardiovascular: RRR, heart sounds normal, no murmur or gallops, no peripheral edema  Musculoskeletal: No deformities, no cyanosis or clubbing  Neuro: alert, non focal  Skin: Warm, no lesions or rashes   ASTHMA Continue symbicort Try to decrease pred to 15mg  qd Allergy shots May need to reconsider xolair in future O2 with exertion rov 4 months

## 2011-11-16 ENCOUNTER — Encounter: Payer: Self-pay | Admitting: Internal Medicine

## 2011-11-20 ENCOUNTER — Ambulatory Visit (INDEPENDENT_AMBULATORY_CARE_PROVIDER_SITE_OTHER): Payer: 59

## 2011-11-20 DIAGNOSIS — J309 Allergic rhinitis, unspecified: Secondary | ICD-10-CM

## 2011-11-27 ENCOUNTER — Ambulatory Visit (INDEPENDENT_AMBULATORY_CARE_PROVIDER_SITE_OTHER): Payer: 59

## 2011-11-27 DIAGNOSIS — J309 Allergic rhinitis, unspecified: Secondary | ICD-10-CM

## 2011-12-25 ENCOUNTER — Ambulatory Visit (INDEPENDENT_AMBULATORY_CARE_PROVIDER_SITE_OTHER): Payer: 59

## 2011-12-25 DIAGNOSIS — J309 Allergic rhinitis, unspecified: Secondary | ICD-10-CM

## 2012-01-01 ENCOUNTER — Ambulatory Visit (INDEPENDENT_AMBULATORY_CARE_PROVIDER_SITE_OTHER): Payer: 59

## 2012-01-01 DIAGNOSIS — J309 Allergic rhinitis, unspecified: Secondary | ICD-10-CM

## 2012-01-22 ENCOUNTER — Encounter: Payer: Self-pay | Admitting: Emergency Medicine

## 2012-01-22 ENCOUNTER — Ambulatory Visit (INDEPENDENT_AMBULATORY_CARE_PROVIDER_SITE_OTHER): Payer: 59 | Admitting: Emergency Medicine

## 2012-01-22 ENCOUNTER — Ambulatory Visit (INDEPENDENT_AMBULATORY_CARE_PROVIDER_SITE_OTHER): Payer: 59

## 2012-01-22 DIAGNOSIS — J45909 Unspecified asthma, uncomplicated: Secondary | ICD-10-CM

## 2012-01-22 DIAGNOSIS — J309 Allergic rhinitis, unspecified: Secondary | ICD-10-CM

## 2012-01-22 NOTE — Patient Instructions (Signed)
Continue your inhaled medications as you are taking them  Get your allergy shots as scheduled Decrease your prednisone to 10mg  daily  Call our office if your breathing changes with the change in prednisone Follow with [name] in 4 months

## 2012-01-22 NOTE — Progress Notes (Signed)
Craig Arnold is a 56 year old man with COPD, chronic cough with chronic postnasal drip, and bronchodilator responsiveness.  Also has documented [facility], completed  therapy (3/09 - 10/09). Follow up sputum negative. Repeat 12/09 showed AFB negative, fungal negative normal flora. Have considered him for Xolair but IgE and eosinophils low in the past.   CT scan of the chest 10/09 showed improvement in nodular infiltrates. Then 12/09 and 08/2010 repeat CTs showed stable mild bronchiectasis and emphysematous changes.  Repeat sputum 12/10 showed AFB negative, fungal negative, normal flora  ROV 05/15/11 -- moderate persistsant asthma + COPD, bronchiectasis. Also hx AFB (treated). Continues to have cough, more wheezing, more exertional dyspnea. He is on pred 5mg  qod, allergy shots q week. He can't tell any difference on the allergy shots. He is using loratadine + mucinex. He is coughing up pale white phlegm. Uses flutter regularly. HWe have planned to repeat CT scan in 08/2011 to follow bronchiectasis.   ROV 06/17/11 -- moderate persistsant asthma + COPD, bronchiectasis. Also hx MAIC (treated). Getting allergy shots q month. He is on Pred 5mg  qd, about to change to qod. I don't see that he has has serum precipitans.   ROV 07/20/11 -- moderate persistsant asthma + COPD, bronchiectasis. Also hx MAIC (treated). Maintained on Pred 5mg  qd. On daliresp qod - was started by [name]. He isn't sure it has helped him in any way. He is scheduled to get first injection of Xolair today, but he is concerned that he cannot pay his 20%, needs to confirm what [facility] will pay, whether this will be better after disability. Serum precipitans negative.   ROV 08/14/11 -- moderate persistsant asthma + COPD, bronchiectasis. Also hx MAIC (treated). Last time we deferred Xolair due to cost. We increase daliresp to qd from qod. He tells me that he has had more cough and "rattling" since last time. Breathing is about the same. He is coughing up thick  white mucous. Remains on Prednisone 5mg  qd.  His wheeze is worse when he lays supine.   ROV 09/18/11 -- moderate persistsant asthma + COPD, bronchiectasis. Also hx MAIC (treated). Started daliresp. Last time we increased Pred to 30mg  daily to see if he would benefit. His wheeze is better. No real change in exertional tolerance. Typically coughing up clear mucous. He needs to get back to allergy shots - has missed last 3-4 visits due to cost.   ROV 11/09/11 -- moderate persistsant asthma + COPD, bronchiectasis. Also hx MAIC (treated). Currently on pred 20 (decreased last time). he doesn't believe he lost any ground on decreased pred. Symbicort + DuoNebs prn (hasn't been needing), We have arranged allergy shots - he is back on these; he was unable to afford Xolair.  Summer is his worst time of year. His activity is limited.   ROV 01/22/12 -- moderate persistsant asthma + COPD, bronchiectasis. Also hx MAIC (treated). Decreased pred to 15mg  last time. He has tolerated. He still has some exertional wheeze and dyspnea. No flares or hospitalizations.     EXAM:  Gen: Pleasant, obese, in no distress,  normal affect, stutters  ENT: No lesions,  mouth clear,  oropharynx clear, no postnasal drip  Neck: No JVD, no TMG, no carotid bruits  Lungs: wheezing is still present but MUCH improved.   Cardiovascular: RRR, heart sounds normal, no murmur or gallops, no peripheral edema  Musculoskeletal: No deformities, no cyanosis or clubbing  Neuro: alert, non focal  Skin: Warm, no lesions or rashes   ASTHMA Same  BD's  Will try to decrease pred to 10mg  daily rov 4 months

## 2012-01-22 NOTE — Assessment & Plan Note (Signed)
Same BD's  Will try to decrease pred to 10mg  daily rov 4 months

## 2012-01-25 ENCOUNTER — Ambulatory Visit (INDEPENDENT_AMBULATORY_CARE_PROVIDER_SITE_OTHER): Payer: 59

## 2012-01-25 DIAGNOSIS — J309 Allergic rhinitis, unspecified: Secondary | ICD-10-CM

## 2012-02-05 ENCOUNTER — Ambulatory Visit (INDEPENDENT_AMBULATORY_CARE_PROVIDER_SITE_OTHER): Payer: 59

## 2012-02-05 DIAGNOSIS — J309 Allergic rhinitis, unspecified: Secondary | ICD-10-CM

## 2012-02-19 ENCOUNTER — Ambulatory Visit (INDEPENDENT_AMBULATORY_CARE_PROVIDER_SITE_OTHER): Payer: 59

## 2012-02-19 DIAGNOSIS — J309 Allergic rhinitis, unspecified: Secondary | ICD-10-CM

## 2012-03-04 ENCOUNTER — Ambulatory Visit (INDEPENDENT_AMBULATORY_CARE_PROVIDER_SITE_OTHER): Payer: 59

## 2012-03-04 DIAGNOSIS — J309 Allergic rhinitis, unspecified: Secondary | ICD-10-CM

## 2012-04-01 ENCOUNTER — Ambulatory Visit (INDEPENDENT_AMBULATORY_CARE_PROVIDER_SITE_OTHER): Payer: 59

## 2012-04-01 DIAGNOSIS — J309 Allergic rhinitis, unspecified: Secondary | ICD-10-CM

## 2012-04-15 ENCOUNTER — Ambulatory Visit (INDEPENDENT_AMBULATORY_CARE_PROVIDER_SITE_OTHER): Payer: 59

## 2012-04-15 DIAGNOSIS — J309 Allergic rhinitis, unspecified: Secondary | ICD-10-CM

## 2012-05-06 ENCOUNTER — Ambulatory Visit (INDEPENDENT_AMBULATORY_CARE_PROVIDER_SITE_OTHER): Payer: 59

## 2012-05-06 DIAGNOSIS — J309 Allergic rhinitis, unspecified: Secondary | ICD-10-CM

## 2012-05-20 ENCOUNTER — Ambulatory Visit (INDEPENDENT_AMBULATORY_CARE_PROVIDER_SITE_OTHER): Payer: 59

## 2012-05-20 DIAGNOSIS — J309 Allergic rhinitis, unspecified: Secondary | ICD-10-CM

## 2012-06-03 ENCOUNTER — Ambulatory Visit (INDEPENDENT_AMBULATORY_CARE_PROVIDER_SITE_OTHER): Payer: 59

## 2012-06-03 ENCOUNTER — Ambulatory Visit (INDEPENDENT_AMBULATORY_CARE_PROVIDER_SITE_OTHER): Payer: 59 | Admitting: Emergency Medicine

## 2012-06-03 ENCOUNTER — Encounter: Payer: Self-pay | Admitting: Emergency Medicine

## 2012-06-03 VITALS — BP 138/86 | HR 78 | Temp 97.5°F | Ht 73.0 in | Wt 269.8 lb

## 2012-06-03 DIAGNOSIS — J45909 Unspecified asthma, uncomplicated: Secondary | ICD-10-CM

## 2012-06-03 DIAGNOSIS — J309 Allergic rhinitis, unspecified: Secondary | ICD-10-CM

## 2012-06-03 DIAGNOSIS — J301 Allergic rhinitis due to pollen: Secondary | ICD-10-CM

## 2012-06-03 NOTE — Patient Instructions (Addendum)
Continue your inhaled meds Continue your allergy shots Continue daliresp We will decrease your prednisone to 10mg  every other day Follow with [name] in 4 months or sooner if you have any problems.

## 2012-06-03 NOTE — Assessment & Plan Note (Signed)
Same BD's.  Continue daliresp Decrease pred to 10mg  qod ROV 4

## 2012-06-03 NOTE — Assessment & Plan Note (Signed)
Allergy shots

## 2012-06-03 NOTE — Progress Notes (Signed)
Craig Arnold is a 56 year old man with COPD, chronic cough with chronic postnasal drip, and bronchodilator responsiveness.  Also has documented [facility], completed  therapy (3/09 - 10/09). Follow up sputum negative. Repeat 12/09 showed AFB negative, fungal negative normal flora. Have considered him for Xolair but IgE and eosinophils low in the past.   CT scan of the chest 10/09 showed improvement in nodular infiltrates. Then 12/09 and 08/2010 repeat CTs showed stable mild bronchiectasis and emphysematous changes.  Repeat sputum 12/10 showed AFB negative, fungal negative, normal flora  ROV 05/15/11 -- moderate persistsant asthma + COPD, bronchiectasis. Also hx AFB (treated). Continues to have cough, more wheezing, more exertional dyspnea. He is on pred 5mg  qod, allergy shots q week. He can't tell any difference on the allergy shots. He is using loratadine + mucinex. He is coughing up pale white phlegm. Uses flutter regularly. HWe have planned to repeat CT scan in 08/2011 to follow bronchiectasis.   ROV 06/17/11 -- moderate persistsant asthma + COPD, bronchiectasis. Also hx MAIC (treated). Getting allergy shots q month. He is on Pred 5mg  qd, about to change to qod. I don't see that he has has serum precipitans.   ROV 07/20/11 -- moderate persistsant asthma + COPD, bronchiectasis. Also hx MAIC (treated). Maintained on Pred 5mg  qd. On daliresp qod - was started by [name]. He isn't sure it has helped him in any way. He is scheduled to get first injection of Xolair today, but he is concerned that he cannot pay his 20%, needs to confirm what [facility] will pay, whether this will be better after disability. Serum precipitans negative.   ROV 08/14/11 -- moderate persistsant asthma + COPD, bronchiectasis. Also hx MAIC (treated). Last time we deferred Xolair due to cost. We increase daliresp to qd from qod. He tells me that he has had more cough and "rattling" since last time. Breathing is about the same. He is coughing up thick  white mucous. Remains on Prednisone 5mg  qd.  His wheeze is worse when he lays supine.   ROV 09/18/11 -- moderate persistsant asthma + COPD, bronchiectasis. Also hx MAIC (treated). Started daliresp. Last time we increased Pred to 30mg  daily to see if he would benefit. His wheeze is better. No real change in exertional tolerance. Typically coughing up clear mucous. He needs to get back to allergy shots - has missed last 3-4 visits due to cost.   ROV 11/09/11 -- moderate persistsant asthma + COPD, bronchiectasis. Also hx MAIC (treated). Currently on pred 20 (decreased last time). he doesn't believe he lost any ground on decreased pred. Symbicort + DuoNebs prn (hasn't been needing), We have arranged allergy shots - he is back on these; he was unable to afford Xolair.  Summer is his worst time of year. His activity is limited.   ROV 01/22/12 -- moderate persistsant asthma + COPD, bronchiectasis. Also hx MAIC (treated). Decreased pred to 15mg  last time. He has tolerated. He still has some exertional wheeze and dyspnea. No flares or hospitalizations.   ROV 06/03/12 -- moderate persistsant asthma + COPD, bronchiectasis. Also hx MAIC (treated). Last time decreased his pred to 10mg  qd. He continues to have intermittent wheezing, has been stable for the most part. Has not needed any extra pred or abx. Using SABA prn, not every day. Taking loratadine and daliresp. Willing to try decreasing the pred to qod.   EXAM:  Filed Vitals:   06/03/12 1606  BP: 138/86  Pulse: 78  Temp: 97.5 F (36.4 C)  Gen: Pleasant, obese, in no distress,  normal affect, stutters  ENT: No lesions,  mouth clear,  oropharynx clear, no postnasal drip  Neck: No JVD, no TMG, no carotid bruits  Lungs: wheezing is still present but MUCH improved.   Cardiovascular: RRR, heart sounds normal, no murmur or gallops, no peripheral edema  Musculoskeletal: No deformities, no cyanosis or clubbing  Neuro: alert, non focal  Skin: Warm, no  lesions or rashes   ASTHMA Same BD's.  Continue daliresp Decrease pred to 10mg  qod ROV 4  ALLERGIC RHINITIS DUE TO POLLEN Allergy shots

## 2012-06-13 ENCOUNTER — Encounter: Payer: Self-pay | Admitting: Internal Medicine

## 2012-07-05 ENCOUNTER — Other Ambulatory Visit: Payer: Self-pay | Admitting: Emergency Medicine

## 2012-07-12 ENCOUNTER — Other Ambulatory Visit: Payer: Self-pay | Admitting: Pulmonary Disease

## 2012-07-12 ENCOUNTER — Other Ambulatory Visit: Payer: Self-pay | Admitting: *Deleted

## 2012-07-12 MED ORDER — BUDESONIDE-FORMOTEROL FUMARATE 160-4.5 MCG/ACT IN AERO: 2.0000 | INHALATION_SPRAY | Freq: Two times a day (BID) | RESPIRATORY_TRACT | Status: DC

## 2012-07-12 NOTE — Telephone Encounter (Signed)
Faxed refill request received for Symbicort 160-4.5 inhaler from [facility]. Patient last seen 06/03/12. Refill sent in to pharmacy.

## 2012-10-17 ENCOUNTER — Ambulatory Visit (INDEPENDENT_AMBULATORY_CARE_PROVIDER_SITE_OTHER): Payer: 59 | Admitting: Emergency Medicine

## 2012-10-17 ENCOUNTER — Encounter: Payer: Self-pay | Admitting: Emergency Medicine

## 2012-10-17 VITALS — BP 160/88 | HR 109 | Temp 99.0°F | Ht 73.0 in | Wt 270.0 lb

## 2012-10-17 DIAGNOSIS — J4489 Other specified chronic obstructive pulmonary disease: Secondary | ICD-10-CM

## 2012-10-17 DIAGNOSIS — J45909 Unspecified asthma, uncomplicated: Secondary | ICD-10-CM

## 2012-10-17 DIAGNOSIS — J449 Chronic obstructive pulmonary disease, unspecified: Secondary | ICD-10-CM

## 2012-10-17 NOTE — Patient Instructions (Addendum)
Please continue your Symbicort twice a day Use your DuoNebs as needed for shortness of breath Continue your prednisone 10mg every other day Continue your daliresp Walking oximetry today showed that you do not need to wear oxygen with exertion anymore. We will cancel this.  You need to restart you allergy shots at [facility] Follow with Dr Berenice Oehlert in 4 months or sooner if you have any problems. 

## 2012-10-17 NOTE — Progress Notes (Signed)
Craig Arnold is a 56 year old man with COPD, chronic cough with chronic postnasal drip, and bronchodilator responsiveness.  Also has documented [facility], completed  therapy (3/09 - 10/09). Follow up sputum negative. Repeat 12/09 showed AFB negative, fungal negative normal flora. Have considered him for Xolair but IgE and eosinophils low in the past.   CT scan of the chest 10/09 showed improvement in nodular infiltrates. Then 12/09 and 08/2010 repeat CTs showed stable mild bronchiectasis and emphysematous changes.  Repeat sputum 12/10 showed AFB negative, fungal negative, normal flora  ROV 05/15/11 -- moderate persistsant asthma + COPD, bronchiectasis. Also hx AFB (treated). Continues to have cough, more wheezing, more exertional dyspnea. He is on pred 5mg  qod, allergy shots q week. He can't tell any difference on the allergy shots. He is using loratadine + mucinex. He is coughing up pale white phlegm. Uses flutter regularly. HWe have planned to repeat CT scan in 08/2011 to follow bronchiectasis.   ROV 06/17/11 -- moderate persistsant asthma + COPD, bronchiectasis. Also hx MAIC (treated). Getting allergy shots q month. He is on Pred 5mg  qd, about to change to qod. I don't see that he has has serum precipitans.   ROV 07/20/11 -- moderate persistsant asthma + COPD, bronchiectasis. Also hx MAIC (treated). Maintained on Pred 5mg  qd. On daliresp qod - was started by Craig Arnold. He isn't sure it has helped him in any way. He is scheduled to get first injection of Xolair today, but he is concerned that he cannot pay his 20%, needs to confirm what [facility] will pay, whether this will be better after disability. Serum precipitans negative.   ROV 08/14/11 -- moderate persistsant asthma + COPD, bronchiectasis. Also hx MAIC (treated). Last time we deferred Xolair due to cost. We increase daliresp to qd from qod. He tells me that he has had more cough and "rattling" since last time. Breathing is about the same. He is coughing up thick  white mucous. Remains on Prednisone 5mg  qd.  His wheeze is worse when he lays supine.   ROV 09/18/11 -- moderate persistsant asthma + COPD, bronchiectasis. Also hx MAIC (treated). Started daliresp. Last time we increased Pred to 30mg  daily to see if he would benefit. His wheeze is better. No real change in exertional tolerance. Typically coughing up clear mucous. He needs to get back to allergy shots - has missed last 3-4 visits due to cost.   ROV 11/09/11 -- moderate persistsant asthma + COPD, bronchiectasis. Also hx MAIC (treated). Currently on pred 20 (decreased last time). he doesn't believe he lost any ground on decreased pred. Symbicort + DuoNebs prn (hasn't been needing), We have arranged allergy shots - he is back on these; he was unable to afford Xolair.  [name] is his worst time of year. His activity is limited.   ROV 01/22/12 -- moderate persistsant asthma + COPD, bronchiectasis. Also hx MAIC (treated). Decreased pred to 15mg  last time. He has tolerated. He still has some exertional wheeze and dyspnea. No flares or hospitalizations.   ROV 06/03/12 -- moderate persistsant asthma + COPD, bronchiectasis. Also hx MAIC (treated). Last time decreased his pred to 10mg  qd. He continues to have intermittent wheezing, has been stable for the most part. Has not needed any extra pred or abx. Using SABA prn, not every day. Taking loratadine and daliresp. Willing to try decreasing the pred to qod.   ROV 10/17/12 -- moderate persistsant asthma + COPD, bronchiectasis. Also hx MAIC (treated).  On pred 10mg  qod. He continues to  have intermittent wheezing, daily cough productive of white phlegm. He remains on daliresp. He has gained 40 lbs since 11/2010.  He is on Symbicort and duonebs prn (about 1 -2 x a week). He stopped allergy shots in July after his wife broke her leg. He isn't sure he misses them.   EXAM:  Filed Vitals:   10/17/12 1634  BP: 160/88  Pulse: 109  Temp: 99 F (37.2 C)    Gen: Pleasant,  obese, in no distress,  normal affect, stutters  ENT: No lesions,  mouth clear,  oropharynx clear, no postnasal drip  Neck: No JVD, no TMG, no carotid bruits  Lungs: wheezing is still present but MUCH improved.   Cardiovascular: RRR, heart sounds normal, no murmur or gallops, no peripheral edema  Musculoskeletal: No deformities, no cyanosis or clubbing  Neuro: alert, non focal  Skin: Warm, no lesions or rashes   ASTHMA Please continue your Symbicort twice a day Use your DuoNebs as needed for shortness of breath Continue your prednisone 10mg  every other day Continue your daliresp Walking oximetry today showed that you do not need to wear oxygen with exertion anymore. We will cancel this.  You need to restart you allergy shots at [facility] Follow with Craig Arnold in 4 months or sooner if you have any problems.

## 2012-10-17 NOTE — Assessment & Plan Note (Signed)
Please continue your Symbicort twice a day Use your DuoNebs as needed for shortness of breath Continue your prednisone 10mg  every other day Continue your daliresp Walking oximetry today showed that you do not need to wear oxygen with exertion anymore. We will cancel this.  You need to restart you allergy shots at [facility] Follow with [name] in 4 months or sooner if you have any problems.

## 2012-11-01 ENCOUNTER — Other Ambulatory Visit: Payer: Self-pay | Admitting: Emergency Medicine

## 2012-11-07 ENCOUNTER — Telehealth: Payer: Self-pay | Admitting: Emergency Medicine

## 2012-11-07 MED ORDER — EPINEPHRINE 0.3 MG/0.3ML IJ DEVI: 0.3000 mg | Freq: Once | INTRAMUSCULAR | Status: DC

## 2012-11-07 NOTE — Telephone Encounter (Signed)
Pt needs a new rx for his Epipen and this will be sent to [facility] in [place] to see if it will be any cheaper that at [facility].

## 2012-11-07 NOTE — Telephone Encounter (Signed)
Per OV note 10/17/12: Patient Instructions     Please continue your Symbicort twice a day  Use your DuoNebs as needed for shortness of breath  Continue your prednisone 10mg  every other day  Continue your daliresp  Walking oximetry today showed that you do not need to wear oxygen with exertion anymore. We will cancel this.  You need to restart you allergy shots at [facility]  Follow with [name] in 4 months or sooner if you have any problems.   ------- lmomtcb x1 for pt

## 2013-01-05 ENCOUNTER — Telehealth: Payer: Self-pay | Admitting: Emergency Medicine

## 2013-01-05 NOTE — Telephone Encounter (Signed)
Will forward to Lauren to look out for fax

## 2013-01-06 ENCOUNTER — Ambulatory Visit (INDEPENDENT_AMBULATORY_CARE_PROVIDER_SITE_OTHER): Payer: 59

## 2013-01-06 DIAGNOSIS — J309 Allergic rhinitis, unspecified: Secondary | ICD-10-CM

## 2013-01-09 NOTE — Telephone Encounter (Signed)
Lauren, did you ever receive this fax?

## 2013-01-10 NOTE — Telephone Encounter (Signed)
Craig Arnold returned call. She says it is a different co. now that faxes the info. "May take a few days to receive". Craig Arnold

## 2013-01-10 NOTE — Telephone Encounter (Signed)
I have not received this paperwork yet.  I have called Ranessa, no answer [id number]

## 2013-01-11 NOTE — Telephone Encounter (Signed)
I have received paperwork, RB has signed and papers faxed. Nothing further needed.

## 2013-01-11 NOTE — Telephone Encounter (Signed)
Will await paperwork from new company.

## 2013-01-13 ENCOUNTER — Ambulatory Visit (INDEPENDENT_AMBULATORY_CARE_PROVIDER_SITE_OTHER): Payer: 59

## 2013-01-13 DIAGNOSIS — J309 Allergic rhinitis, unspecified: Secondary | ICD-10-CM

## 2013-01-17 ENCOUNTER — Telehealth: Payer: Self-pay | Admitting: Emergency Medicine

## 2013-01-17 MED ORDER — BUDESONIDE-FORMOTEROL FUMARATE 160-4.5 MCG/ACT IN AERO: 2.0000 | INHALATION_SPRAY | Freq: Two times a day (BID) | RESPIRATORY_TRACT | Status: DC

## 2013-01-17 NOTE — Telephone Encounter (Signed)
Last ov w/ RB 10.28.13. Upcoming 3.25.14.  Refills symbicort sent to [facility].  LMOM TCB x1 to inform pt.

## 2013-01-18 NOTE — Telephone Encounter (Signed)
Pt aware refills have been sent.Craig Arnold

## 2013-01-20 ENCOUNTER — Ambulatory Visit (INDEPENDENT_AMBULATORY_CARE_PROVIDER_SITE_OTHER): Payer: 59

## 2013-01-20 DIAGNOSIS — J309 Allergic rhinitis, unspecified: Secondary | ICD-10-CM

## 2013-01-27 ENCOUNTER — Ambulatory Visit (INDEPENDENT_AMBULATORY_CARE_PROVIDER_SITE_OTHER): Payer: 59

## 2013-01-27 DIAGNOSIS — J309 Allergic rhinitis, unspecified: Secondary | ICD-10-CM

## 2013-02-03 ENCOUNTER — Ambulatory Visit: Payer: 59 | Admitting: Internal Medicine

## 2013-02-03 ENCOUNTER — Ambulatory Visit: Payer: 59

## 2013-02-06 ENCOUNTER — Telehealth: Payer: Self-pay | Admitting: Emergency Medicine

## 2013-02-06 NOTE — Telephone Encounter (Signed)
Spoke with Ranessa.  States form was faxed to [phone]. Looking through pt's chart, this form was received and signed by RB.  Per Ranessa, pls refax to the # given on the form, [phone].  Gatha Mayer state she will inform her manager that we are refaxing and will call back if anything further is needed.  Form refaxed, Ranessa aware -- will sign off.

## 2013-02-06 NOTE — Telephone Encounter (Signed)
lmomtcb for Ranessa to make sure she has the correct fax #  In the meantime, will route to Lauren to see if they have seen a fax come through on this -- Fax requesting a copd monitoring device from [facility].  Pls advise.  Thank you.

## 2013-02-06 NOTE — Telephone Encounter (Signed)
Ranessa returned traige's call.   Craig Arnold

## 2013-02-10 ENCOUNTER — Ambulatory Visit (INDEPENDENT_AMBULATORY_CARE_PROVIDER_SITE_OTHER): Payer: 59

## 2013-02-17 ENCOUNTER — Ambulatory Visit (INDEPENDENT_AMBULATORY_CARE_PROVIDER_SITE_OTHER): Payer: 59

## 2013-02-17 DIAGNOSIS — J309 Allergic rhinitis, unspecified: Secondary | ICD-10-CM

## 2013-02-20 ENCOUNTER — Ambulatory Visit (INDEPENDENT_AMBULATORY_CARE_PROVIDER_SITE_OTHER): Payer: 59

## 2013-02-20 DIAGNOSIS — J309 Allergic rhinitis, unspecified: Secondary | ICD-10-CM

## 2013-02-23 ENCOUNTER — Telehealth: Payer: Self-pay | Admitting: Internal Medicine

## 2013-02-23 ENCOUNTER — Telehealth: Payer: Self-pay | Admitting: Emergency Medicine

## 2013-02-23 MED ORDER — ROFLUMILAST 500 MCG PO TABS: 500.0000 ug | ORAL_TABLET | Freq: Every day | ORAL | Status: DC

## 2013-02-23 NOTE — Telephone Encounter (Signed)
Spoke with patient, needs rx on daliresp. Rx sent, patient aware and nothing further needed.

## 2013-02-23 NOTE — Telephone Encounter (Signed)
Error- wrong dr for this med request. Hazel Sams

## 2013-02-24 ENCOUNTER — Ambulatory Visit: Payer: 59

## 2013-03-03 ENCOUNTER — Ambulatory Visit: Payer: 59 | Admitting: Internal Medicine

## 2013-03-03 ENCOUNTER — Ambulatory Visit (INDEPENDENT_AMBULATORY_CARE_PROVIDER_SITE_OTHER): Payer: 59

## 2013-03-03 DIAGNOSIS — J309 Allergic rhinitis, unspecified: Secondary | ICD-10-CM

## 2013-03-10 ENCOUNTER — Ambulatory Visit (INDEPENDENT_AMBULATORY_CARE_PROVIDER_SITE_OTHER): Payer: 59

## 2013-03-10 DIAGNOSIS — J309 Allergic rhinitis, unspecified: Secondary | ICD-10-CM

## 2013-03-14 ENCOUNTER — Encounter: Payer: Self-pay | Admitting: Emergency Medicine

## 2013-03-14 ENCOUNTER — Ambulatory Visit (INDEPENDENT_AMBULATORY_CARE_PROVIDER_SITE_OTHER): Payer: 59 | Admitting: Emergency Medicine

## 2013-03-14 VITALS — BP 142/88 | HR 90 | Temp 97.0°F | Ht 73.0 in | Wt 271.0 lb

## 2013-03-14 DIAGNOSIS — J449 Chronic obstructive pulmonary disease, unspecified: Secondary | ICD-10-CM

## 2013-03-14 MED ORDER — IPRATROPIUM BROMIDE 0.03 % NA SOLN: 2.0000 | Freq: Three times a day (TID) | NASAL | Status: DC | PRN

## 2013-03-14 NOTE — Assessment & Plan Note (Signed)
Please continue your current medications as you have been taking them.  Start using atrovent nasal spray 2 sprays each side 2-3x  a day. Follow with [name] in 3 months or sooner if you have any problems

## 2013-03-14 NOTE — Patient Instructions (Addendum)
Please continue your current medications as you have been taking them.  Start using atrovent nasal spray 2 sprays each side 2-3x  a day. Follow with Dr Buster Schueller in 3 months or sooner if you have any problems 

## 2013-03-14 NOTE — Addendum Note (Signed)
Addended by: Leslye Peer on: 03/14/2013 11:17 AM   Modules accepted: Orders

## 2013-03-14 NOTE — Progress Notes (Signed)
[name] is a 57 year old man with COPD, chronic cough with chronic postnasal drip, and bronchodilator responsiveness.  Also has documented [facility], completed  therapy (3/09 - 10/09). Follow up sputum negative. Repeat 12/09 showed AFB negative, fungal negative normal flora. Have considered him for Xolair but IgE and eosinophils low in the past.   CT scan of the chest 10/09 showed improvement in nodular infiltrates. Then 12/09 and 08/2010 repeat CTs showed stable mild bronchiectasis and emphysematous changes.  Repeat sputum 12/10 showed AFB negative, fungal negative, normal flora  ROV 05/15/11 -- moderate persistsant asthma + COPD, bronchiectasis. Also hx AFB (treated). Continues to have cough, more wheezing, more exertional dyspnea. He is on pred 5mg  qod, allergy shots q week. He can't tell any difference on the allergy shots. He is using loratadine + mucinex. He is coughing up pale white phlegm. Uses flutter regularly. HWe have planned to repeat CT scan in 08/2011 to follow bronchiectasis.   ROV 06/17/11 -- moderate persistsant asthma + COPD, bronchiectasis. Also hx MAIC (treated). Getting allergy shots q month. He is on Pred 5mg  qd, about to change to qod. I don't see that he has has serum precipitans.   ROV 07/20/11 -- moderate persistsant asthma + COPD, bronchiectasis. Also hx MAIC (treated). Maintained on Pred 5mg  qd. On daliresp qod - was started by [name]. He isn't sure it has helped him in any way. He is scheduled to get first injection of Xolair today, but he is concerned that he cannot pay his 20%, needs to confirm what [facility] will pay, whether this will be better after disability. Serum precipitans negative.   ROV 08/14/11 -- moderate persistsant asthma + COPD, bronchiectasis. Also hx MAIC (treated). Last time we deferred Xolair due to cost. We increase daliresp to qd from qod. He tells me that he has had more cough and "rattling" since last time. Breathing is about the same. He is coughing up thick  white mucous. Remains on Prednisone 5mg  qd.  His wheeze is worse when he lays supine.   ROV 09/18/11 -- moderate persistsant asthma + COPD, bronchiectasis. Also hx MAIC (treated). Started daliresp. Last time we increased Pred to 30mg  daily to see if he would benefit. His wheeze is better. No real change in exertional tolerance. Typically coughing up clear mucous. He needs to get back to allergy shots - has missed last 3-4 visits due to cost.   ROV 11/09/11 -- moderate persistsant asthma + COPD, bronchiectasis. Also hx MAIC (treated). Currently on pred 20 (decreased last time). he doesn't believe he lost any ground on decreased pred. Symbicort + DuoNebs prn (hasn't been needing), We have arranged allergy shots - he is back on these; he was unable to afford Xolair.  [name] is his worst time of year. His activity is limited.   ROV 01/22/12 -- moderate persistsant asthma + COPD, bronchiectasis. Also hx MAIC (treated). Decreased pred to 15mg  last time. He has tolerated. He still has some exertional wheeze and dyspnea. No flares or hospitalizations.   ROV 06/03/12 -- moderate persistsant asthma + COPD, bronchiectasis. Also hx MAIC (treated). Last time decreased his pred to 10mg  qd. He continues to have intermittent wheezing, has been stable for the most part. Has not needed any extra pred or abx. Using SABA prn, not every day. Taking loratadine and daliresp. Willing to try decreasing the pred to qod.   ROV 10/17/12 -- moderate persistsant asthma + COPD, bronchiectasis. Also hx MAIC (treated).  On pred 10mg  qod. He continues to  have intermittent wheezing, daily cough productive of white phlegm. He remains on daliresp. He has gained 40 lbs since 11/2010.  He is on Symbicort and duonebs prn (about 1 -2 x a week). He stopped allergy shots in July after his wife broke her leg. He isn't sure he misses them.   ROV 03/14/13 -- moderate persistsant asthma + COPD, bronchiectasis. Also hx MAIC (treated). He is back on allergy  shots. Not really any different > sneezing, rhinorrhea. On loratadine, mucinex. Daliresp + duonebs prn + symbicort. Pred 10 qod. Overall stable.   EXAM:  Filed Vitals:   03/14/13 1042  BP: 142/88  Pulse: 90  Temp: 97 F (36.1 C)    Gen: Pleasant, obese, in no distress,  normal affect, stutters  ENT: No lesions,  mouth clear,  oropharynx clear, no postnasal drip  Neck: No JVD, no TMG, no carotid bruits  Lungs: wheezing is still present but MUCH improved.   Cardiovascular: RRR, heart sounds normal, no murmur or gallops, no peripheral edema  Musculoskeletal: No deformities, no cyanosis or clubbing  Neuro: alert, non focal  Skin: Warm, no lesions or rashes   No problem-specific assessment & plan notes found for this encounter.

## 2013-03-17 ENCOUNTER — Ambulatory Visit: Payer: 59

## 2013-03-23 ENCOUNTER — Telehealth: Payer: Self-pay | Admitting: Emergency Medicine

## 2013-03-23 MED ORDER — ALBUTEROL SULFATE HFA 108 (90 BASE) MCG/ACT IN AERS: 2.0000 | INHALATION_SPRAY | RESPIRATORY_TRACT | Status: DC | PRN

## 2013-03-23 NOTE — Telephone Encounter (Signed)
Rx has been sent in. Pt is aware. Nothing further was needed. 

## 2013-03-24 ENCOUNTER — Ambulatory Visit (INDEPENDENT_AMBULATORY_CARE_PROVIDER_SITE_OTHER): Payer: 59

## 2013-03-24 DIAGNOSIS — J309 Allergic rhinitis, unspecified: Secondary | ICD-10-CM

## 2013-03-31 ENCOUNTER — Ambulatory Visit: Payer: 59

## 2013-04-21 ENCOUNTER — Ambulatory Visit (INDEPENDENT_AMBULATORY_CARE_PROVIDER_SITE_OTHER): Payer: 59

## 2013-04-21 DIAGNOSIS — J309 Allergic rhinitis, unspecified: Secondary | ICD-10-CM

## 2013-04-28 ENCOUNTER — Ambulatory Visit: Payer: 59

## 2013-05-09 ENCOUNTER — Telehealth: Payer: Self-pay | Admitting: Emergency Medicine

## 2013-05-09 MED ORDER — PREDNISONE 10 MG PO TABS: 10.0000 mg | ORAL_TABLET | ORAL | Status: DC

## 2013-05-09 MED ORDER — ROFLUMILAST 500 MCG PO TABS: 500.0000 ug | ORAL_TABLET | Freq: Every day | ORAL | Status: DC

## 2013-05-09 NOTE — Telephone Encounter (Signed)
Pt needs 90 day supply of prednisone and [facility] Pt is aware. Carron Curie, CMA

## 2013-06-01 ENCOUNTER — Ambulatory Visit: Payer: 59 | Admitting: Emergency Medicine

## 2013-06-12 ENCOUNTER — Telehealth: Payer: Self-pay | Admitting: Emergency Medicine

## 2013-06-12 NOTE — Telephone Encounter (Signed)
lmomtcb x1 

## 2013-06-13 MED ORDER — BUDESONIDE-FORMOTEROL FUMARATE 160-4.5 MCG/ACT IN AERO: 2.0000 | INHALATION_SPRAY | Freq: Two times a day (BID) | RESPIRATORY_TRACT | Status: DC

## 2013-06-13 NOTE — Telephone Encounter (Signed)
Pt informed that rx for Symbicort was sent ot [facility] for 3 mth supply

## 2013-06-30 ENCOUNTER — Encounter: Payer: Self-pay | Admitting: Emergency Medicine

## 2013-06-30 ENCOUNTER — Ambulatory Visit (INDEPENDENT_AMBULATORY_CARE_PROVIDER_SITE_OTHER): Payer: 59 | Admitting: Emergency Medicine

## 2013-06-30 VITALS — BP 160/90 | HR 78 | Temp 97.5°F | Ht 73.0 in | Wt 269.8 lb

## 2013-06-30 DIAGNOSIS — J449 Chronic obstructive pulmonary disease, unspecified: Secondary | ICD-10-CM

## 2013-06-30 NOTE — Assessment & Plan Note (Signed)
Please continue your current medications.  We will perform spirometry today Follow with Dr Mikhi Athey in 3 months or sooner if you have any problems.  

## 2013-06-30 NOTE — Patient Instructions (Addendum)
Please continue your current medications.  We will perform spirometry today Follow with [name] in 3 months or sooner if you have any problems.

## 2013-06-30 NOTE — Progress Notes (Signed)
Craig Arnold is a 57 year old man with COPD, chronic cough with chronic postnasal drip, and bronchodilator responsiveness.  Also has documented [facility], completed  therapy (3/09 - 10/09). Follow up sputum negative. Repeat 12/09 showed AFB negative, fungal negative normal flora. Have considered him for Xolair but IgE and eosinophils low in the past.   CT scan of the chest 10/09 showed improvement in nodular infiltrates. Then 12/09 and 08/2010 repeat CTs showed stable mild bronchiectasis and emphysematous changes.  Repeat sputum 12/10 showed AFB negative, fungal negative, normal flora  ROV 05/15/11 -- moderate persistsant asthma + COPD, bronchiectasis. Also hx AFB (treated). Continues to have cough, more wheezing, more exertional dyspnea. He is on pred 5mg  qod, allergy shots q week. He can't tell any difference on the allergy shots. He is using loratadine + mucinex. He is coughing up pale white phlegm. Uses flutter regularly. HWe have planned to repeat CT scan in 08/2011 to follow bronchiectasis.   ROV 06/17/11 -- moderate persistsant asthma + COPD, bronchiectasis. Also hx MAIC (treated). Getting allergy shots q month. He is on Pred 5mg  qd, about to change to qod. I don't see that he has has serum precipitans.   ROV 07/20/11 -- moderate persistsant asthma + COPD, bronchiectasis. Also hx MAIC (treated). Maintained on Pred 5mg  qd. On daliresp qod - was started by [name]. He isn't sure it has helped him in any way. He is scheduled to get first injection of Xolair today, but he is concerned that he cannot pay his 20%, needs to confirm what [facility] will pay, whether this will be better after disability. Serum precipitans negative.   ROV 08/14/11 -- moderate persistsant asthma + COPD, bronchiectasis. Also hx MAIC (treated). Last time we deferred Xolair due to cost. We increase daliresp to qd from qod. He tells me that he has had more cough and "rattling" since last time. Breathing is about the same. He is coughing up thick  white mucous. Remains on Prednisone 5mg  qd.  His wheeze is worse when he lays supine.   ROV 09/18/11 -- moderate persistsant asthma + COPD, bronchiectasis. Also hx MAIC (treated). Started daliresp. Last time we increased Pred to 30mg  daily to see if he would benefit. His wheeze is better. No real change in exertional tolerance. Typically coughing up clear mucous. He needs to get back to allergy shots - has missed last 3-4 visits due to cost.   ROV 11/09/11 -- moderate persistsant asthma + COPD, bronchiectasis. Also hx MAIC (treated). Currently on pred 20 (decreased last time). he doesn't believe he lost any ground on decreased pred. Symbicort + DuoNebs prn (hasn't been needing), We have arranged allergy shots - he is back on these; he was unable to afford Xolair.  Summer is his worst time of year. His activity is limited.   ROV 01/22/12 -- moderate persistsant asthma + COPD, bronchiectasis. Also hx MAIC (treated). Decreased pred to 15mg  last time. He has tolerated. He still has some exertional wheeze and dyspnea. No flares or hospitalizations.   ROV 06/03/12 -- moderate persistsant asthma + COPD, bronchiectasis. Also hx MAIC (treated). Last time decreased his pred to 10mg  qd. He continues to have intermittent wheezing, has been stable for the most part. Has not needed any extra pred or abx. Using SABA prn, not every day. Taking loratadine and daliresp. Willing to try decreasing the pred to qod.   ROV 10/17/12 -- moderate persistsant asthma + COPD, bronchiectasis. Also hx MAIC (treated).  On pred 10mg  qod. He continues to  have intermittent wheezing, daily cough productive of white phlegm. He remains on daliresp. He has gained 40 lbs since 11/2010.  He is on Symbicort and duonebs prn (about 1 -2 x a week). He stopped allergy shots in July after his wife broke her leg. He isn't sure he misses them.   ROV 03/14/13 -- moderate persistsant asthma + COPD, bronchiectasis. Also hx MAIC (treated). He is back on allergy  shots. Not really any different > sneezing, rhinorrhea. On loratadine, mucinex. Daliresp + duonebs prn + symbicort. Pred 10 qod. Overall stable.   ROV 06/30/13 -- moderate persistsant asthma + COPD, bronchiectasis. Also hx MAIC (treated). Allergies. He stopped atrovent nasal due to epistaxis. He has been having more wheeze, more SOB with the hot weather. Overall fairly stable.   EXAM:  Filed Vitals:   06/30/13 1523  BP: 160/90  Pulse: 78  Temp: 97.5 F (36.4 C)    Gen: Pleasant, obese, in no distress,  normal affect, stutters  ENT: No lesions,  mouth clear,  oropharynx clear, no postnasal drip  Neck: No JVD, no TMG, no carotid bruits  Lungs: wheezing barely audible on exp  Cardiovascular: RRR, heart sounds normal, no murmur or gallops, no peripheral edema  Musculoskeletal: No deformities, no cyanosis or clubbing  Neuro: alert, non focal  Skin: Warm, no lesions or rashes   COPD Please continue your current medications.  We will perform spirometry today Follow with [name] in 3 months or sooner if you have any problems

## 2013-07-03 ENCOUNTER — Telehealth: Payer: Self-pay | Admitting: Emergency Medicine

## 2013-07-03 NOTE — Telephone Encounter (Signed)
Patient requesting results of spirometry done 06/30/13 @ [facility]  Per RB--- very similar to last spirometry, no worse  ATC patient no answer [id number]

## 2013-07-03 NOTE — Telephone Encounter (Signed)
Spoke with patient, made him aware of results as listed below Patient verbalized understanding and nothing further needed at this time

## 2013-10-24 ENCOUNTER — Ambulatory Visit: Payer: 59 | Admitting: Emergency Medicine

## 2013-11-28 ENCOUNTER — Ambulatory Visit (INDEPENDENT_AMBULATORY_CARE_PROVIDER_SITE_OTHER): Payer: 59 | Admitting: Emergency Medicine

## 2013-11-28 ENCOUNTER — Encounter: Payer: Self-pay | Admitting: Emergency Medicine

## 2013-11-28 VITALS — BP 150/100 | HR 81 | Ht 73.0 in | Wt 266.8 lb

## 2013-11-28 DIAGNOSIS — J449 Chronic obstructive pulmonary disease, unspecified: Secondary | ICD-10-CM

## 2013-11-28 NOTE — Patient Instructions (Signed)
Please continue current medications We will consider decreasing prednisone in the summer after allergy season Follow with Dr. Delton Coombes in 4 months or sooner if you have any problems

## 2013-11-28 NOTE — Progress Notes (Signed)
[name] is a 57 year old man with COPD, chronic cough with chronic postnasal drip, and bronchodilator responsiveness.  Also has documented [facility], completed  therapy (3/09 - 10/09). Follow up sputum negative. Repeat 12/09 showed AFB negative, fungal negative normal flora. Have considered him for Xolair but IgE and eosinophils low in the past.   CT scan of the chest 10/09 showed improvement in nodular infiltrates. Then 12/09 and 08/2010 repeat CTs showed stable mild bronchiectasis and emphysematous changes.  Repeat sputum 12/10 showed AFB negative, fungal negative, normal flora  ROV 05/15/11 -- moderate persistsant asthma + COPD, bronchiectasis. Also hx AFB (treated). Continues to have cough, more wheezing, more exertional dyspnea. He is on pred 5mg  qod, allergy shots q week. He can't tell any difference on the allergy shots. He is using loratadine + mucinex. He is coughing up pale white phlegm. Uses flutter regularly. HWe have planned to repeat CT scan in 08/2011 to follow bronchiectasis.   ROV 06/17/11 -- moderate persistsant asthma + COPD, bronchiectasis. Also hx MAIC (treated). Getting allergy shots q month. He is on Pred 5mg  qd, about to change to qod. I don't see that he has has serum precipitans.   ROV 07/20/11 -- moderate persistsant asthma + COPD, bronchiectasis. Also hx MAIC (treated). Maintained on Pred 5mg  qd. On daliresp qod - was started by [name]. He isn't sure it has helped him in any way. He is scheduled to get first injection of Xolair today, but he is concerned that he cannot pay his 20%, needs to confirm what [facility] will pay, whether this will be better after disability. Serum precipitans negative.   ROV 08/14/11 -- moderate persistsant asthma + COPD, bronchiectasis. Also hx MAIC (treated). Last time we deferred Xolair due to cost. We increase daliresp to qd from qod. He tells me that he has had more cough and "rattling" since last time. Breathing is about the same. He is coughing up thick  white mucous. Remains on Prednisone 5mg  qd.  His wheeze is worse when he lays supine.   ROV 09/18/11 -- moderate persistsant asthma + COPD, bronchiectasis. Also hx MAIC (treated). Started daliresp. Last time we increased Pred to 30mg  daily to see if he would benefit. His wheeze is better. No real change in exertional tolerance. Typically coughing up clear mucous. He needs to get back to allergy shots - has missed last 3-4 visits due to cost.   ROV 11/09/11 -- moderate persistsant asthma + COPD, bronchiectasis. Also hx MAIC (treated). Currently on pred 20 (decreased last time). he doesn't believe he lost any ground on decreased pred. Symbicort + DuoNebs prn (hasn't been needing), We have arranged allergy shots - he is back on these; he was unable to afford Xolair.  Summer is his worst time of year. His activity is limited.   ROV 01/22/12 -- moderate persistsant asthma + COPD, bronchiectasis. Also hx MAIC (treated). Decreased pred to 15mg  last time. He has tolerated. He still has some exertional wheeze and dyspnea. No flares or hospitalizations.   ROV 06/03/12 -- moderate persistsant asthma + COPD, bronchiectasis. Also hx MAIC (treated). Last time decreased his pred to 10mg  qd. He continues to have intermittent wheezing, has been stable for the most part. Has not needed any extra pred or abx. Using SABA prn, not every day. Taking loratadine and daliresp. Willing to try decreasing the pred to qod.   ROV 10/17/12 -- moderate persistsant asthma + COPD, bronchiectasis. Also hx MAIC (treated).  On pred 10mg  qod. He continues to  have intermittent wheezing, daily cough productive of white phlegm. He remains on daliresp. He has gained 40 lbs since 11/2010.  He is on Symbicort and duonebs prn (about 1 -2 x a week). He stopped allergy shots in July after his wife broke her leg. He isn't sure he misses them.   ROV 03/14/13 -- moderate persistsant asthma + COPD, bronchiectasis. Also hx MAIC (treated). He is back on allergy  shots. Not really any different > sneezing, rhinorrhea. On loratadine, mucinex. Daliresp + duonebs prn + symbicort. Pred 10 qod. Overall stable.   ROV 06/30/13 -- moderate persistsant asthma + COPD, bronchiectasis. Also hx MAIC (treated). Allergies. He stopped atrovent nasal due to epistaxis. He has been having more wheeze, more SOB with the hot weather. Overall fairly stable.   ROV 11/28/13 -- moderate persistsant asthma + COPD, bronchiectasis. Also hx MAIC (treated). Allergies. Returns for f/u. He is hypertensive today. His breathing is about the same - he has episodes of wheeze and cough, treats with SABA. Uses 1-2x a day. Remains functional, active. Has a lot of additional stress due to his Mom's fractured hip, rehab.   EXAM:  Filed Vitals:   11/28/13 0915  BP: 150/100  Pulse: 81  Height: 6\' 1"  (1.854 m)  Weight: 266 lb 12.8 oz (121.02 kg)  SpO2: 98%    Gen: Pleasant, obese, in no distress,  normal affect, stutters  ENT: No lesions,  mouth clear,  oropharynx clear, no postnasal drip  Neck: No JVD, no TMG, no carotid bruits  Lungs: wheezing barely audible on exp  Cardiovascular: RRR, heart sounds normal, no murmur or gallops, no peripheral edema  Musculoskeletal: No deformities, no cyanosis or clubbing  Neuro: alert, non focal  Skin: Warm, no lesions or rashes   COPD Appears to be clinically stable. He does still have persistent asthma symptoms that are controlled by albuterol.  - Continue prednisone 10 mg every other day, and consider decreasing to 5 mg in the summer after allergy season - Continue same bronchodilator regimen - Follow in 4 months

## 2013-11-28 NOTE — Assessment & Plan Note (Signed)
Appears to be clinically stable. He does still have persistent asthma symptoms that are controlled by albuterol.  - Continue prednisone 10 mg every other day, and consider decreasing to 5 mg in the summer after allergy season - Continue same bronchodilator regimen - Follow in 4 months

## 2013-12-28 ENCOUNTER — Telehealth: Payer: Self-pay | Admitting: Internal Medicine

## 2013-12-28 NOTE — Telephone Encounter (Signed)
FYI Pt. Stopped shots 04/21/13. He stopped before when his wife broke her leg,again not sure it's helping.

## 2014-01-11 ENCOUNTER — Encounter: Payer: Self-pay | Admitting: Internal Medicine

## 2014-01-22 ENCOUNTER — Telehealth: Payer: Self-pay | Admitting: Emergency Medicine

## 2014-01-22 MED ORDER — PREDNISONE 10 MG PO TABS
10.0000 mg | ORAL_TABLET | ORAL | Status: DC
Start: 2014-01-22 — End: 2014-02-01

## 2014-01-22 NOTE — Telephone Encounter (Signed)
Pt returned triage's call.  Holly D Pryor ° °

## 2014-01-22 NOTE — Telephone Encounter (Signed)
lmomtcb x1 for pt 

## 2014-01-22 NOTE — Telephone Encounter (Signed)
I called and spoke with pt. He reports he received a refill on prednisone last week [facility]. He reports he accidentally spilled them into his sink and now they dissolved in water and some went down the drain. He had no refills lef ton bottle. He is aware he may have to pay out of pocket for this. He asked for a new RX to be sent to [facility]. Nothing further needed

## 2014-02-01 ENCOUNTER — Telehealth: Payer: Self-pay | Admitting: Emergency Medicine

## 2014-02-01 ENCOUNTER — Other Ambulatory Visit: Payer: Self-pay | Admitting: *Deleted

## 2014-02-01 MED ORDER — PREDNISONE 10 MG PO TABS: 10.0000 mg | ORAL_TABLET | ORAL | Status: DC

## 2014-02-01 NOTE — Telephone Encounter (Signed)
Rx has been resent. Pt is aware.  

## 2014-02-05 ENCOUNTER — Other Ambulatory Visit: Payer: Self-pay | Admitting: *Deleted

## 2014-02-07 ENCOUNTER — Telehealth: Payer: Self-pay | Admitting: Emergency Medicine

## 2014-02-07 MED ORDER — PREDNISONE 10 MG PO TABS: 10.0000 mg | ORAL_TABLET | ORAL | Status: DC

## 2014-02-07 NOTE — Telephone Encounter (Signed)
Pt states refill request that was sent to [facility] did not show quantity or refills, so in order for [facility] to refill this needed to be corrected. I have corrected this for 90 days with 3 refills to [facility]. Nothing further is needed. 1 tab every other day

## 2014-05-09 ENCOUNTER — Ambulatory Visit (INDEPENDENT_AMBULATORY_CARE_PROVIDER_SITE_OTHER): Payer: 59 | Admitting: Emergency Medicine

## 2014-05-09 ENCOUNTER — Encounter: Payer: Self-pay | Admitting: Emergency Medicine

## 2014-05-09 VITALS — BP 140/72 | HR 107 | Ht 73.0 in | Wt 258.0 lb

## 2014-05-09 DIAGNOSIS — J45909 Unspecified asthma, uncomplicated: Secondary | ICD-10-CM

## 2014-05-09 NOTE — Assessment & Plan Note (Signed)
Please continue your symbicort twice a day Use your duonebs as needed  Use albuterol inhaler as needed Continue your prednisone 10mg every other day Follow with Dr Maralee Higuchi in 6 months or sooner if you have any problems  

## 2014-05-09 NOTE — Progress Notes (Signed)
Craig Arnold is a 58 year old man with COPD, chronic cough with chronic postnasal drip, and bronchodilator responsiveness.  Also has documented [facility], completed  therapy (3/09 - 10/09). Follow up sputum negative. Repeat 12/09 showed AFB negative, fungal negative normal flora. Have considered him for Xolair but IgE and eosinophils low in the past.   CT scan of the chest 10/09 showed improvement in nodular infiltrates. Then 12/09 and 08/2010 repeat CTs showed stable mild bronchiectasis and emphysematous changes.  Repeat sputum 12/10 showed AFB negative, fungal negative, normal flora  ROV 05/15/11 -- moderate persistsant asthma + COPD, bronchiectasis. Also hx AFB (treated). Continues to have cough, more wheezing, more exertional dyspnea. He is on pred 5mg  qod, allergy shots q week. He can't tell any difference on the allergy shots. He is using loratadine + mucinex. He is coughing up pale white phlegm. Uses flutter regularly. HWe have planned to repeat CT scan in 08/2011 to follow bronchiectasis.   ROV 06/17/11 -- moderate persistsant asthma + COPD, bronchiectasis. Also hx MAIC (treated). Getting allergy shots q month. He is on Pred 5mg  qd, about to change to qod. I don't see that he has has serum precipitans.   ROV 07/20/11 -- moderate persistsant asthma + COPD, bronchiectasis. Also hx MAIC (treated). Maintained on Pred 5mg  qd. On daliresp qod - was started by Dr Annamaria Boots. He isn't sure it has helped him in any way. He is scheduled to get first injection of Xolair today, but he is concerned that he cannot pay his 20%, needs to confirm what [facility] will pay, whether this will be better after disability. Serum precipitans negative.   ROV 08/14/11 -- moderate persistsant asthma + COPD, bronchiectasis. Also hx MAIC (treated). Last time we deferred Xolair due to cost. We increase daliresp to qd from qod. He tells me that he has had more cough and "rattling" since last time. Breathing is about the same. He is coughing up thick  white mucous. Remains on Prednisone 5mg  qd.  His wheeze is worse when he lays supine.   ROV 09/18/11 -- moderate persistsant asthma + COPD, bronchiectasis. Also hx MAIC (treated). Started daliresp. Last time we increased Pred to 30mg  daily to see if he would benefit. His wheeze is better. No real change in exertional tolerance. Typically coughing up clear mucous. He needs to get back to allergy shots - has missed last 3-4 visits due to cost.   ROV 11/09/11 -- moderate persistsant asthma + COPD, bronchiectasis. Also hx MAIC (treated). Currently on pred 20 (decreased last time). he doesn't believe he lost any ground on decreased pred. Symbicort + DuoNebs prn (hasn't been needing), We have arranged allergy shots - he is back on these; he was unable to afford Xolair.  Summer is his worst time of year. His activity is limited.   ROV 01/22/12 -- moderate persistsant asthma + COPD, bronchiectasis. Also hx MAIC (treated). Decreased pred to 15mg  last time. He has tolerated. He still has some exertional wheeze and dyspnea. No flares or hospitalizations.   ROV 06/03/12 -- moderate persistsant asthma + COPD, bronchiectasis. Also hx MAIC (treated). Last time decreased his pred to 10mg  qd. He continues to have intermittent wheezing, has been stable for the most part. Has not needed any extra pred or abx. Using SABA prn, not every day. Taking loratadine and daliresp. Willing to try decreasing the pred to qod.   ROV 10/17/12 -- moderate persistsant asthma + COPD, bronchiectasis. Also hx MAIC (treated).  On pred 10mg  qod. He continues to  have intermittent wheezing, daily cough productive of white phlegm. He remains on daliresp. He has gained 40 lbs since 11/2010.  He is on Symbicort and duonebs prn (about 1 -2 x a week). He stopped allergy shots in July after his wife broke her leg. He isn't sure he misses them.   ROV 03/14/13 -- moderate persistsant asthma + COPD, bronchiectasis. Also hx MAIC (treated). He is back on allergy  shots. Not really any different > sneezing, rhinorrhea. On loratadine, mucinex. Daliresp + duonebs prn + symbicort. Pred 10 qod. Overall stable.   ROV 06/30/13 -- moderate persistsant asthma + COPD, bronchiectasis. Also hx MAIC (treated). Allergies. He stopped atrovent nasal due to epistaxis. He has been having more wheeze, more SOB with the hot weather. Overall fairly stable.   ROV 11/28/13 -- moderate persistsant asthma + COPD, bronchiectasis. Also hx MAIC (treated). Allergies. Returns for f/u. He is hypertensive today. His breathing is about the same - he has episodes of wheeze and cough, treats with SABA. Uses 1-2x a day. Remains functional, active. Has a lot of additional stress due to his Mom's fractured hip, rehab.   ROV 05/09/14 -- moderate persistsant asthma + COPD, bronchiectasis. Also hx MAIC (treated). He is staying active. He is using albuterol several times a week. He does have some wheezing flares. He has more congestion at night. Currently on pred 10 qod.   EXAM:  Filed Vitals:   05/09/14 1635  BP: 140/72  Pulse: 107  Height: 6\' 1"  (1.854 m)  Weight: 258 lb (117.028 kg)  SpO2: 98%    Gen: Pleasant, obese, in no distress,  normal affect, stutters  ENT: No lesions,  mouth clear,  oropharynx clear, no postnasal drip  Neck: No JVD, no TMG, no carotid bruits  Lungs: scattered wheezes on exp and insp, few rhonchi.   Cardiovascular: RRR, heart sounds normal, no murmur or gallops, no peripheral edema  Musculoskeletal: No deformities, no cyanosis or clubbing  Neuro: alert, non focal  Skin: Warm, no lesions or rashes   ASTHMA Please continue your symbicort twice a day Use your duonebs as needed  Use albuterol inhaler as needed Continue your prednisone 10mg  every other day Follow with Dr Lamonte Sakai in 6 months or sooner if you have any problems

## 2014-05-09 NOTE — Patient Instructions (Signed)
Please continue your symbicort twice a day Use your duonebs as needed  Use albuterol inhaler as needed Continue your prednisone 10mg  every other day Follow with Dr Lamonte Sakai in 6 months or sooner if you have any problems

## 2014-08-10 ENCOUNTER — Other Ambulatory Visit: Payer: Self-pay | Admitting: Emergency Medicine

## 2014-08-14 NOTE — Telephone Encounter (Signed)
Pt calling to check on status of refill from [facility].Craig Arnold

## 2014-08-15 ENCOUNTER — Telehealth: Payer: Self-pay | Admitting: Emergency Medicine

## 2014-08-15 MED ORDER — ALBUTEROL SULFATE HFA 108 (90 BASE) MCG/ACT IN AERS: 2.0000 | INHALATION_SPRAY | RESPIRATORY_TRACT | Status: DC | PRN

## 2014-08-15 NOTE — Telephone Encounter (Signed)
Refill sent. Pt aware.Ailie Gage, CMA  

## 2014-09-04 ENCOUNTER — Telehealth: Payer: Self-pay | Admitting: Emergency Medicine

## 2014-09-04 MED ORDER — ROFLUMILAST 500 MCG PO TABS: 500.0000 ug | ORAL_TABLET | Freq: Every day | ORAL | Status: DC

## 2014-09-04 NOTE — Telephone Encounter (Signed)
Spoke with patient-aware that I have sent Rx for Daliresp 90 day supply one time only and when he follows up in November then get a full year Rx by RB. Nothing more needed at this time.

## 2014-09-24 ENCOUNTER — Telehealth: Payer: Self-pay | Admitting: Emergency Medicine

## 2014-09-24 MED ORDER — BUDESONIDE-FORMOTEROL FUMARATE 160-4.5 MCG/ACT IN AERO
2.0000 | INHALATION_SPRAY | Freq: Two times a day (BID) | RESPIRATORY_TRACT | Status: DC
Start: 2014-09-24 — End: 2015-11-08

## 2014-09-24 NOTE — Telephone Encounter (Signed)
Called pt. Aware symbicort has been called in. Nothing further needed

## 2014-10-23 ENCOUNTER — Encounter: Payer: Self-pay | Admitting: Emergency Medicine

## 2014-10-23 ENCOUNTER — Ambulatory Visit (INDEPENDENT_AMBULATORY_CARE_PROVIDER_SITE_OTHER): Payer: 59 | Admitting: Emergency Medicine

## 2014-10-23 VITALS — BP 178/102 | HR 96 | Temp 97.5°F | Ht 73.0 in | Wt 263.8 lb

## 2014-10-23 DIAGNOSIS — J45909 Unspecified asthma, uncomplicated: Secondary | ICD-10-CM

## 2014-10-23 NOTE — Assessment & Plan Note (Signed)
Offered him the flu shot, her refused.   Continue your Symbicort twice a day  Use your albuterol inhaler or your DuoNeb nebulizer if needed for shortness of breath Increase your prednisone to 10mg  every day  Follow with Dr Lamonte Sakai in 3 months or sooner if you have any problems.

## 2014-10-23 NOTE — Progress Notes (Signed)
Craig Arnold is a 58 year old man with COPD, chronic cough with chronic postnasal drip, and bronchodilator responsiveness.  Also has documented [facility], completed  therapy (3/09 - 10/09). Follow up sputum negative. Repeat 12/09 showed AFB negative, fungal negative normal flora. Have considered him for Xolair but IgE and eosinophils low in the past.   CT scan of the chest 10/09 showed improvement in nodular infiltrates. Then 12/09 and 08/2010 repeat CTs showed stable mild bronchiectasis and emphysematous changes.  Repeat sputum 12/10 showed AFB negative, fungal negative, normal flora   ROV 11/28/13 -- moderate persistsant asthma + COPD, bronchiectasis. Also hx MAIC (treated). Allergies. Returns for f/u. He is hypertensive today. His breathing is about the same - he has episodes of wheeze and cough, treats with SABA. Uses 1-2x a day. Remains functional, active. Has a lot of additional stress due to his Mom's fractured hip, rehab.   ROV 05/09/14 -- moderate persistsant asthma + COPD, bronchiectasis. Also hx MAIC (treated). He is staying active. He is using albuterol several times a week. He does have some wheezing flares. He has more congestion at night. Currently on pred 10 qod.   ROV 10/23/14 -- follow up for moderate persistent asthma, COPD, treated MAIC, recurrent wheeze. Has been more problematic for the last month.  He is on symbicort + albuterol + daliresp.   EXAM:  Filed Vitals:   10/23/14 1613  BP: 178/102  Pulse: 96  Temp: 97.5 F (36.4 C)  TempSrc: Oral  Height: 6\' 1"  (1.854 m)  Weight: 263 lb 12.8 oz (119.659 kg)  SpO2: 97%    Gen: Pleasant, obese, in no distress,  normal affect, stutters  ENT: No lesions,  mouth clear,  oropharynx clear, no postnasal drip  Neck: No JVD, no TMG, no carotid bruits  Lungs: scattered wheezes on exp and insp, few rhonchi. Louder on L than R  Cardiovascular: RRR, heart sounds normal, no murmur or gallops, no peripheral edema  Musculoskeletal: No  deformities, no cyanosis or clubbing  Neuro: alert, non focal  Skin: Warm, no lesions or rashes   Intrinsic asthma Offered him the flu shot, her refused.   Continue your Symbicort twice a day  Use your albuterol inhaler or your DuoNeb nebulizer if needed for shortness of breath Increase your prednisone to 10mg  every day  Follow with Dr Lamonte Sakai in 3 months or sooner if you have any problems.

## 2014-10-23 NOTE — Patient Instructions (Signed)
Continue your Symbicort twice a day  Use your albuterol inhaler or your DuoNeb nebulizer if needed for shortness of breath Increase your prednisone to 10mg  every day  Follow with Dr Lamonte Sakai in 3 months or sooner if you have any problems.

## 2014-10-26 ENCOUNTER — Encounter: Payer: Self-pay | Admitting: Internal Medicine

## 2014-10-29 ENCOUNTER — Ambulatory Visit: Payer: 59 | Admitting: Emergency Medicine

## 2014-11-10 ENCOUNTER — Other Ambulatory Visit: Payer: Self-pay | Admitting: Emergency Medicine

## 2015-03-06 ENCOUNTER — Telehealth: Payer: Self-pay | Admitting: Emergency Medicine

## 2015-03-06 MED ORDER — PREDNISONE 10 MG PO TABS: 10.0000 mg | ORAL_TABLET | Freq: Every day | ORAL | Status: DC

## 2015-03-06 NOTE — Telephone Encounter (Signed)
Rx has been sent in. Pt is aware. Nothing further was needed. 

## 2015-05-02 ENCOUNTER — Ambulatory Visit: Payer: Self-pay | Admitting: Emergency Medicine

## 2015-05-16 ENCOUNTER — Encounter: Payer: Self-pay | Admitting: Emergency Medicine

## 2015-05-16 ENCOUNTER — Ambulatory Visit (INDEPENDENT_AMBULATORY_CARE_PROVIDER_SITE_OTHER): Payer: 59 | Admitting: Emergency Medicine

## 2015-05-16 VITALS — BP 142/80 | HR 88 | Ht 73.0 in | Wt 249.0 lb

## 2015-05-16 DIAGNOSIS — A319 Mycobacterial infection, unspecified: Secondary | ICD-10-CM

## 2015-05-16 DIAGNOSIS — J45909 Unspecified asthma, uncomplicated: Secondary | ICD-10-CM | POA: Diagnosis not present

## 2015-05-16 NOTE — Assessment & Plan Note (Signed)
Without evidence of recurrence. Last CT scan of the chest was in 2011. I have decided to defer any repeat scans unless he changes clinically.

## 2015-05-16 NOTE — Assessment & Plan Note (Signed)
Continue current regimen that includes Symbicort, Della rest, prednisone 10 mg daily. We need to refill his DuoNeb when necessary and will do so today. Follow-up in 6 months.

## 2015-05-16 NOTE — Progress Notes (Signed)
Craig Arnold is a 59 year old man with COPD, chronic cough with chronic postnasal drip, and bronchodilator responsiveness.  Also has documented [facility], completed  therapy (3/09 - 10/09). Follow up sputum negative. Repeat 12/09 showed AFB negative, fungal negative normal flora. Have considered him for Xolair but IgE and eosinophils low in the past.   CT scan of the chest 10/09 showed improvement in nodular infiltrates. Then 12/09 and 08/2010 repeat CTs showed stable mild bronchiectasis and emphysematous changes.  Repeat sputum 12/10 showed AFB negative, fungal negative, normal flora  ROV 05/09/14 -- moderate persistsant asthma + COPD, bronchiectasis. Also hx MAIC (treated). He is staying active. He is using albuterol several times a week. He does have some wheezing flares. He has more congestion at night. Currently on pred 10 qod.   ROV 10/23/14 -- follow up for moderate persistent asthma, COPD, treated MAIC, recurrent wheeze. Has been more problematic for the last month.  He is on symbicort + albuterol + daliresp.   ROV 05/16/15 -- follow-up visit for COPD, moderate persistent asthma, documented mycobacterial disease that has been treated with antibiotics. He is currently on prednisone > 10mg  qd . He has continued to have intermittent wheeze. He coughs occasionally, usually white mucous. He is on symbicort, daliresp. He has run out of duoneb, hasn't used in a long time.     EXAM:  Filed Vitals:   05/16/15 1108  BP: 142/80  Pulse: 88  Height: 6\' 1"  (1.854 m)  Weight: 249 lb (112.946 kg)  SpO2: 97%    Gen: Pleasant, obese, in no distress,  normal affect, stutters  ENT: No lesions,  mouth clear,  oropharynx clear, no postnasal drip  Neck: No JVD, no TMG, no carotid bruits  Lungs: much more clear today, no wheeze.   Cardiovascular: RRR, heart sounds normal, no murmur or gallops, no peripheral edema  Musculoskeletal: No deformities, no cyanosis or clubbing  Neuro: alert, non focal  Skin: Warm,  no lesions or rashes   Disease due to mycobacteria Without evidence of recurrence. Last CT scan of the chest was in 2011. I have decided to defer any repeat scans unless he changes clinically.    Intrinsic asthma Continue current regimen that includes Symbicort, Della rest, prednisone 10 mg daily. We need to refill his DuoNeb when necessary and will do so today. Follow-up in 6 months.

## 2015-05-16 NOTE — Patient Instructions (Addendum)
Please continue your prednisone as you have been taking it Continue Symbicort and Daliresp We will refill your DuoNeb today. Use these up to every 6 hours if needed for shortness of breath Follow with Dr Lamonte Sakai in 6 months or sooner if you have any problems

## 2015-09-05 ENCOUNTER — Telehealth: Payer: Self-pay | Admitting: Emergency Medicine

## 2015-09-05 MED ORDER — ROFLUMILAST 500 MCG PO TABS: 500.0000 ug | ORAL_TABLET | Freq: Every day | ORAL | Status: DC

## 2015-09-05 NOTE — Telephone Encounter (Signed)
Called and spoke to pt. Pt [facility] Rx sent to preferred pharmacy. Pt verbalized understanding and denied any further questions or concerns at this time.

## 2015-11-08 ENCOUNTER — Telehealth: Payer: Self-pay | Admitting: Emergency Medicine

## 2015-11-08 MED ORDER — BUDESONIDE-FORMOTEROL FUMARATE 160-4.5 MCG/ACT IN AERO: 2.0000 | INHALATION_SPRAY | Freq: Two times a day (BID) | RESPIRATORY_TRACT | Status: DC

## 2015-11-08 MED ORDER — PREDNISONE 10 MG PO TABS: 10.0000 mg | ORAL_TABLET | Freq: Every day | ORAL | Status: DC

## 2015-11-08 NOTE — Telephone Encounter (Signed)
Called spoke w/ pt. He needs his prednisone and symbicort refilled for [facility]. I have done so. Nothing further needed

## 2015-11-27 ENCOUNTER — Ambulatory Visit: Payer: 59 | Admitting: Emergency Medicine

## 2015-11-28 ENCOUNTER — Encounter: Payer: Self-pay | Admitting: Internal Medicine

## 2016-02-25 ENCOUNTER — Ambulatory Visit (INDEPENDENT_AMBULATORY_CARE_PROVIDER_SITE_OTHER): Payer: 59 | Admitting: Emergency Medicine

## 2016-02-25 ENCOUNTER — Encounter: Payer: Self-pay | Admitting: Emergency Medicine

## 2016-02-25 VITALS — BP 132/82 | HR 112 | Ht 73.0 in | Wt 235.0 lb

## 2016-02-25 DIAGNOSIS — J209 Acute bronchitis, unspecified: Secondary | ICD-10-CM | POA: Diagnosis not present

## 2016-02-25 DIAGNOSIS — J45909 Unspecified asthma, uncomplicated: Secondary | ICD-10-CM

## 2016-02-25 MED ORDER — LEVOFLOXACIN 750 MG PO TABS: 750.0000 mg | ORAL_TABLET | Freq: Every day | ORAL | Status: DC

## 2016-02-25 MED ORDER — IPRATROPIUM-ALBUTEROL 0.5-2.5 (3) MG/3ML IN SOLN: 3.0000 mL | Freq: Four times a day (QID) | RESPIRATORY_TRACT | Status: DC | PRN

## 2016-02-25 MED ORDER — PREDNISONE 10 MG PO TABS: ORAL_TABLET | ORAL | Status: DC

## 2016-02-25 NOTE — Assessment & Plan Note (Signed)
Bacterial bronchitis following an upper respiratory infection. I believe he needs to be treated for this with Levaquin and also for an exacerbation of his asthma.

## 2016-02-25 NOTE — Patient Instructions (Signed)
Please take Levaquin for 1 week as directed please increase prednisone and taper down to your usual 10mg  daily Continue her inhaled medications as you have been taking them Continue daliresp as you have been taking it Follow with Dr Lamonte Sakai in 3 months or sooner if you have any problems.

## 2016-02-25 NOTE — Progress Notes (Signed)
Craig Arnold is a 60 year old man with COPD, chronic cough with chronic postnasal drip, and bronchodilator responsiveness.  Also has documented [facility], completed  therapy (3/09 - 10/09). Follow up sputum negative. Repeat 12/09 showed AFB negative, fungal negative normal flora. Have considered him for Xolair but IgE and eosinophils low in the past.   CT scan of the chest 10/09 showed improvement in nodular infiltrates. Then 12/09 and 08/2010 repeat CTs showed stable mild bronchiectasis and emphysematous changes.  Repeat sputum 12/10 showed AFB negative, fungal negative, normal flora  ROV 05/09/14 -- moderate persistsant asthma + COPD, bronchiectasis. Also hx MAIC (treated). He is staying active. He is using albuterol several times a week. He does have some wheezing flares. He has more congestion at night. Currently on pred 10 qod.   ROV 10/23/14 -- follow up for moderate persistent asthma, COPD, treated MAIC, recurrent wheeze. Has been more problematic for the last month.  He is on symbicort + albuterol + daliresp.   ROV 05/16/15 -- follow-up visit for COPD, moderate persistent asthma, documented mycobacterial disease that has been treated with antibiotics. He is currently on prednisone > 10mg  qd . He has continued to have intermittent wheeze. He coughs occasionally, usually white mucous. He is on symbicort, daliresp. He has run out of duoneb, hasn't used in a long time.   ROV 02/25/16 -- follow-up visit for moderate persistent asthma, COPD, history of mycobacterial disease (treated). He's been maintained on chronic prednisone currently at 10 mg a day. His other medications include Symbicort, Daliresp. He has been doing fairly well for months until 1 week ago when he developed some fatigue, weakness URI sx, poor sleep. He takes loratadine, mucinex. Uses albuterol few times a week. He is coughing up brown mucous.     EXAM:  Filed Vitals:   02/25/16 0911  BP: 132/82  Pulse: 112  Height: 6\' 1"  (1.854 m)   Weight: 235 lb (106.595 kg)  SpO2: 94%    Gen: Pleasant, obese, in no distress,  normal affect, stutters  ENT: No lesions,  mouth clear,  oropharynx clear, no postnasal drip  Neck: No JVD, no TMG, no carotid bruits  Lungs: much more clear today, no wheeze.   Cardiovascular: RRR, heart sounds normal, no murmur or gallops, no peripheral edema  Musculoskeletal: No deformities, no cyanosis or clubbing  Neuro: alert, non focal  Skin: Warm, no lesions or rashes   Acute bronchitis Bacterial bronchitis following an upper respiratory infection. I believe he needs to be treated for this with Levaquin and also for an exacerbation of his asthma.  Intrinsic asthma Prednisone taper and then back down to 10 mg daily. Treat bronchitis as below. Continue Symbicort and Daliresp as ordered. He needs his Duonebs refilled.

## 2016-02-25 NOTE — Assessment & Plan Note (Signed)
Prednisone taper and then back down to 10 mg daily. Treat bronchitis as below. Continue Symbicort and Daliresp as ordered. He needs his Duonebs refilled.

## 2016-02-25 NOTE — Addendum Note (Signed)
Addended by: Desmond Dike C on: 02/25/2016 09:51 AM   Modules accepted: Orders

## 2016-05-07 ENCOUNTER — Telehealth: Payer: Self-pay | Admitting: Emergency Medicine

## 2016-05-07 MED ORDER — ALBUTEROL SULFATE HFA 108 (90 BASE) MCG/ACT IN AERS: 2.0000 | INHALATION_SPRAY | RESPIRATORY_TRACT | Status: DC | PRN

## 2016-05-07 NOTE — Telephone Encounter (Signed)
Spoke with pt and advised that refill for Albuterol was sent to [facility] for 3 mth supply.

## 2016-06-09 ENCOUNTER — Encounter: Payer: Self-pay | Admitting: Emergency Medicine

## 2016-06-09 ENCOUNTER — Ambulatory Visit (INDEPENDENT_AMBULATORY_CARE_PROVIDER_SITE_OTHER): Payer: 59 | Admitting: Emergency Medicine

## 2016-06-09 VITALS — BP 144/102 | HR 97 | Ht 73.0 in | Wt 240.0 lb

## 2016-06-09 DIAGNOSIS — I10 Essential (primary) hypertension: Secondary | ICD-10-CM

## 2016-06-09 DIAGNOSIS — J449 Chronic obstructive pulmonary disease, unspecified: Secondary | ICD-10-CM | POA: Diagnosis not present

## 2016-06-09 NOTE — Assessment & Plan Note (Signed)
DBP elevated today. He describes episodes of imbalance and feeling like he may pass out. These are not necessarily predictable or associated with exercise. They are associated with episodes when he looks up at scaffolding or walks across a glass wall quite. Question whether they may be associated with some degree of agorophobia or panic.  Please make an appointment with Dr Edrick Oh to discuss your blood pressure and the sensation that you have been having when walking. Go and seek care immediately if you have an episode where you think you are going to pass-out.  Follow with Dr Lamonte Sakai in 6 months or sooner if you have any problems

## 2016-06-09 NOTE — Patient Instructions (Addendum)
Please continue yor prednisone 10mg  daily.  Please continue your inhaled medications as you have been taking them Continue daliresp  Please make an appointment with Dr Edrick Oh to discuss your blood pressure and the sensation that you have been having when walking. Go and seek care immediately if you have an episode where you think you are going to pass-out.  Follow with Dr Lamonte Sakai in 6 months or sooner if you have any problems

## 2016-06-09 NOTE — Assessment & Plan Note (Signed)
Please continue yor prednisone 10mg  daily.  Please continue your inhaled medications as you have been taking them Continue daliresp   Follow with Dr Lamonte Sakai in 6 months or sooner if you have any problems

## 2016-06-09 NOTE — Progress Notes (Signed)
Craig Arnold is a 60 year old man with COPD, chronic cough with chronic postnasal drip, and bronchodilator responsiveness.  Also has documented [facility], completed  therapy (3/09 - 10/09). Follow up sputum negative. Repeat 12/09 showed AFB negative, fungal negative normal flora. Have considered him for Xolair but IgE and eosinophils low in the past.   CT scan of the chest 10/09 showed improvement in nodular infiltrates. Then 12/09 and 08/2010 repeat CTs showed stable mild bronchiectasis and emphysematous changes.  Repeat sputum 12/10 showed AFB negative, fungal negative, normal flora  ROV 05/09/14 -- moderate persistsant asthma + COPD, bronchiectasis. Also hx MAIC (treated). He is staying active. He is using albuterol several times a week. He does have some wheezing flares. He has more congestion at night. Currently on pred 10 qod.   ROV 10/23/14 -- follow up for moderate persistent asthma, COPD, treated MAIC, recurrent wheeze. Has been more problematic for the last month.  He is on symbicort + albuterol + daliresp.   ROV 05/16/15 -- follow-up visit for COPD, moderate persistent asthma, documented mycobacterial disease that has been treated with antibiotics. He is currently on prednisone > 10mg  qd . He has continued to have intermittent wheeze. He coughs occasionally, usually white mucous. He is on symbicort, daliresp. He has run out of duoneb, hasn't used in a long time.   ROV 02/25/16 -- follow-up visit for moderate persistent asthma, COPD, history of mycobacterial disease (treated). He's been maintained on chronic prednisone currently at 10 mg a day. His other medications include Symbicort, Daliresp. He has been doing fairly well for months until 1 week ago when he developed some fatigue, weakness URI sx, poor sleep. He takes loratadine, mucinex. Uses albuterol few times a week. He is coughing up brown mucous.   ROV 06/09/16 -- patient has a history of moderate persistent asthma with associated COPD, treated  mycobacterial disease, chronic prednisone use. At our last visit he was treated for acute bronchitis with Levaquin as well as a prednisone taper. He is now back down to his usual dose of 10 mg daily. He continues to have some "rough days". On a good day he won't need any SABA. No cough currently, does hear some intermittent wheeze. He has had some "swimmy-headedness" on a couple occasions in the last 2 weeks. Has not had syncope. He wonders if he is having a panic response when out at a store.     EXAM:  Filed Vitals:   06/09/16 0908  BP: 144/102  Pulse: 97  Height: 6\' 1"  (1.854 m)  Weight: 240 lb (108.863 kg)  SpO2: 99%    Gen: Pleasant, obese, in no distress,  normal affect, stutters  ENT: No lesions,  mouth clear,  oropharynx clear, no postnasal drip  Neck: No JVD, no TMG, no carotid bruits  Lungs: much more clear today, no wheeze. Some UA noise.,   Cardiovascular: RRR, heart sounds normal, no murmur or gallops, no peripheral edema  Musculoskeletal: No deformities, no cyanosis or clubbing  Neuro: alert, non focal  Skin: Warm, no lesions or rashes   COPD (chronic obstructive pulmonary disease) (HCC) Please continue yor prednisone 10mg  daily.  Please continue your inhaled medications as you have been taking them Continue daliresp   Follow with Dr Lamonte Sakai in 6 months or sooner if you have any problems  Essential hypertension DBP elevated today. He describes episodes of imbalance and feeling like he may pass out. These are not necessarily predictable or associated with exercise. They are associated with episodes when  he looks up at scaffolding or walks across a glass wall quite. Question whether they may be associated with some degree of agorophobia or panic.  Please make an appointment with Dr Edrick Oh to discuss your blood pressure and the sensation that you have been having when walking. Go and seek care immediately if you have an episode where you think you are going to  pass-out.  Follow with Dr Lamonte Sakai in 6 months or sooner if you have any problems   Baltazar Apo, MD, PhD 06/09/2016, 9:37 AM [facility] [phone] or if no answer [phone]

## 2016-09-10 ENCOUNTER — Telehealth: Payer: Self-pay | Admitting: Emergency Medicine

## 2016-09-10 MED ORDER — ROFLUMILAST 500 MCG PO TABS
500.0000 ug | ORAL_TABLET | Freq: Every day | ORAL | 1 refills | Status: DC
Start: 2016-09-10 — End: 2017-01-14

## 2016-09-10 MED ORDER — PREDNISONE 10 MG PO TABS: 10.0000 mg | ORAL_TABLET | Freq: Every day | ORAL | 2 refills | Status: DC

## 2016-09-10 NOTE — Telephone Encounter (Signed)
Called spoke with pt. He needed his daliresp and prednisone refilled. I have done so. Nothing further needed

## 2017-01-14 ENCOUNTER — Telehealth: Payer: Self-pay | Admitting: Emergency Medicine

## 2017-01-14 MED ORDER — BUDESONIDE-FORMOTEROL FUMARATE 160-4.5 MCG/ACT IN AERO: 2.0000 | INHALATION_SPRAY | Freq: Two times a day (BID) | RESPIRATORY_TRACT | 0 refills | Status: DC

## 2017-01-14 MED ORDER — ROFLUMILAST 500 MCG PO TABS: 500.0000 ug | ORAL_TABLET | Freq: Every day | ORAL | 0 refills | Status: DC

## 2017-01-14 NOTE — Telephone Encounter (Signed)
Pt called and stated he is changing his pharmacy from [facility] to [place], [place]. He also requested to send in refills for Symbicort and Dalisrep. I will send the refills in and I made an appt to see Dr. Lamonte Sakai for follow up.

## 2017-01-15 ENCOUNTER — Encounter: Payer: Self-pay | Admitting: Emergency Medicine

## 2017-01-15 ENCOUNTER — Ambulatory Visit (INDEPENDENT_AMBULATORY_CARE_PROVIDER_SITE_OTHER): Payer: 59 | Admitting: Emergency Medicine

## 2017-01-15 DIAGNOSIS — J45909 Unspecified asthma, uncomplicated: Secondary | ICD-10-CM

## 2017-01-15 DIAGNOSIS — I10 Essential (primary) hypertension: Secondary | ICD-10-CM

## 2017-01-15 NOTE — Patient Instructions (Signed)
Please continue Symbicort, daliresp, prednisone as you have been taking them  Take albuterol 2 puffs up to every 4 hours if needed for shortness of breath.  Continue loratadine and mucinex You should consider getting the flu shot, it would probably benefit Follow up with Dr Edrick Oh as planned Follow with Dr Lamonte Sakai in 6 months or sooner if you have any problems

## 2017-01-15 NOTE — Progress Notes (Signed)
Mr. Timbers is a 61 year old man with COPD, chronic cough with chronic postnasal drip, and bronchodilator responsiveness.  Also has documented [facility], completed  therapy (3/09 - 10/09). Follow up sputum negative. Repeat 12/09 showed AFB negative, fungal negative normal flora. Have considered him for Xolair but IgE and eosinophils low in the past.   CT scan of the chest 10/09 showed improvement in nodular infiltrates. Then 12/09 and 08/2010 repeat CTs showed stable mild bronchiectasis and emphysematous changes.  Repeat sputum 12/10 showed AFB negative, fungal negative, normal flora   ROV 02/25/16 -- follow-up visit for moderate persistent asthma, COPD, history of mycobacterial disease (treated). He's been maintained on chronic prednisone currently at 10 mg a day. His other medications include Symbicort, Daliresp. He has been doing fairly well for months until 1 week ago when he developed some fatigue, weakness URI sx, poor sleep. He takes loratadine, mucinex. Uses albuterol few times a week. He is coughing up brown mucous.   ROV 06/09/16 -- patient has a history of moderate persistent asthma with associated COPD, treated mycobacterial disease, chronic prednisone use. At our last visit he was treated for acute bronchitis with Levaquin as well as a prednisone taper. He is now back down to his usual dose of 10 mg daily. He continues to have some "rough days". On a good day he won't need any SABA. No cough currently, does hear some intermittent wheeze. He has had some "swimmy-headedness" on a couple occasions in the last 2 weeks. Has not had syncope. He wonders if he is having a panic response when out at a store.   ROV 01/15/17 -- this follow-up visit for moderate persistent asthma and associated COPD, chronic prednisone use. He also has a history of treated mycobacterial disease (2009). Most recent sputa have been negative. He is managed on Symbicort, pred 10mg , daliresp, loratadine. Uses mucinex. No acute flares. He  does have some daily wheeze, worst in the cold weather. He uses SABA about 1x a day. His exercise tolerance is stable. He is able to do some housework, work outside. He has gained some weight since last visit. He does not want the flu shot.    EXAM:  Vitals:   01/15/17 1031  BP: (!) 160/60  BP Location: Right Arm  Patient Position: Sitting  Cuff Size: Normal  Pulse: (!) 103  SpO2: 99%  Weight: 241 lb 12.8 oz (109.7 kg)  Height: 6\' 1"  (1.854 m)    Gen: Pleasant, obese, in no distress,  normal affect, stutters  ENT: No lesions,  mouth clear,  oropharynx clear, no postnasal drip  Neck: No JVD, no TMG, no carotid bruits  Lungs: few scattered B exp wheezes, consistent with his baseline.   Cardiovascular: RRR, heart sounds normal, no murmur or gallops, no peripheral edema  Musculoskeletal: No deformities, no cyanosis or clubbing  Neuro: alert, non focal  Skin: Warm, no lesions or rashes   Essential hypertension Has not seen Dr Edrick Oh recently, knows that he needs to re-establish care. Plans to do so.   Intrinsic asthma Fixed asthma / COPD. Stable at this time. No flares.   Please continue Symbicort, daliresp, prednisone as you have been taking them  Take albuterol 2 puffs up to every 4 hours if needed for shortness of breath.  Continue loratadine and mucinex You should consider getting the flu shot, it would probably benefit Follow up with Dr Edrick Oh as planned Follow with Dr Lamonte Sakai in 6 months or sooner if you have any problems  Craig Arnold  Lamonte Sakai, MD, PhD 01/15/2017, 10:47 AM [facility] [phone] or if no answer [phone]

## 2017-01-15 NOTE — Assessment & Plan Note (Signed)
Has not seen Dr Edrick Oh recently, knows that he needs to re-establish care. Plans to do so.

## 2017-01-15 NOTE — Assessment & Plan Note (Signed)
Fixed asthma / COPD. Stable at this time. No flares.   Please continue Symbicort, daliresp, prednisone as you have been taking them  Take albuterol 2 puffs up to every 4 hours if needed for shortness of breath.  Continue loratadine and mucinex You should consider getting the flu shot, it would probably benefit Follow up with Dr Edrick Oh as planned Follow with Dr Lamonte Sakai in 6 months or sooner if you have any problems

## 2017-02-23 ENCOUNTER — Telehealth: Payer: Self-pay | Admitting: Emergency Medicine

## 2017-02-23 MED ORDER — PREDNISONE 10 MG PO TABS: 10.0000 mg | ORAL_TABLET | Freq: Every day | ORAL | 2 refills | Status: DC

## 2017-02-23 NOTE — Telephone Encounter (Signed)
Rx sent to preferred pharmacy. Pt aware and voiced his understanding. Nothing further needed.  

## 2017-04-11 ENCOUNTER — Other Ambulatory Visit: Payer: Self-pay | Admitting: Emergency Medicine

## 2017-04-15 ENCOUNTER — Telehealth: Payer: Self-pay | Admitting: Emergency Medicine

## 2017-04-15 MED ORDER — ROFLUMILAST 500 MCG PO TABS: 500.0000 ug | ORAL_TABLET | Freq: Every day | ORAL | 1 refills | Status: DC

## 2017-04-15 MED ORDER — ALBUTEROL SULFATE HFA 108 (90 BASE) MCG/ACT IN AERS: 2.0000 | INHALATION_SPRAY | RESPIRATORY_TRACT | 1 refills | Status: DC | PRN

## 2017-04-15 NOTE — Telephone Encounter (Signed)
Patient is also requesting a refill on Proair

## 2017-04-15 NOTE — Telephone Encounter (Signed)
Called spoke with patient who verified he is requesting a refill on his Proair and Daliresp to [facility] in [place] #90 day supply  Refills sent Nothing further needed Will sign off

## 2017-06-15 ENCOUNTER — Encounter: Payer: Self-pay | Admitting: Family Medicine

## 2017-06-15 ENCOUNTER — Ambulatory Visit (INDEPENDENT_AMBULATORY_CARE_PROVIDER_SITE_OTHER): Payer: 59 | Admitting: Family Medicine

## 2017-06-15 VITALS — BP 171/87 | HR 105 | Temp 99.3°F | Ht 73.0 in | Wt 237.5 lb

## 2017-06-15 DIAGNOSIS — Z131 Encounter for screening for diabetes mellitus: Secondary | ICD-10-CM

## 2017-06-15 DIAGNOSIS — I10 Essential (primary) hypertension: Secondary | ICD-10-CM | POA: Diagnosis not present

## 2017-06-15 DIAGNOSIS — Z13220 Encounter for screening for lipoid disorders: Secondary | ICD-10-CM | POA: Diagnosis not present

## 2017-06-15 MED ORDER — LISINOPRIL 20 MG PO TABS: 20.0000 mg | ORAL_TABLET | Freq: Every day | ORAL | 1 refills | Status: DC

## 2017-06-15 NOTE — Progress Notes (Signed)
BP (!) 171/87   Pulse (!) 105   Temp 99.3 F (37.4 C) (Oral)   Ht _0  (1.854 m)   Wt 237 lb 8 oz (107.7 kg)   BMI 31.33 kg/m    Subjective:    Patient ID: Craig Arnold, male    DOB: [birth year], 61 y.o.   MRN: [id number]  HPI: Craig Arnold is a 61 y.o. male presenting on 06/15/2017 for Establish Care   HPI Hypertension Patient is currently on No medications but he was told to come here by his pulmonologist because his blood pressure has been elevated consistently, and their blood pressure today is 171/87. Patient denies any lightheadedness or dizziness. Patient denies headaches, blurred vision, chest pains, shortness of breath, or weakness. Denies any side effects from medication and is content with current medication.   COPD Patient has COPD which is being treated by Dr. Lamonte Sakai and is controlled at this point. He quit smoking in 2009. He does have some chemical exposures in his previous careers but not currently. His lungs have been stable for the last year at least. He infrequently has to use his rescue inhaler  Relevant past medical, surgical, family and social history reviewed and updated as indicated. Interim medical history since our last visit reviewed. Allergies and medications reviewed and updated.  Review of Systems  Constitutional: Negative for chills and fever.  HENT: Negative for ear pain and tinnitus.   Eyes: Negative for pain and discharge.  Respiratory: Positive for cough and wheezing. Negative for shortness of breath.   Cardiovascular: Negative for chest pain, palpitations and leg swelling.  Gastrointestinal: Negative for abdominal pain, blood in stool, constipation and diarrhea.  Genitourinary: Negative for dysuria and hematuria.  Musculoskeletal: Negative for back pain, gait problem and myalgias.  Skin: Negative for rash.  Neurological: Negative for dizziness, weakness and headaches.  Psychiatric/Behavioral: Negative for suicidal ideas.  All other systems  reviewed and are negative.   Per HPI unless specifically indicated above  Social History   Social History  . Marital status: Married    Spouse name: N/A  . Number of children: N/A  . Years of education: N/A   Occupational History  . landscaping    Social History Main Topics  . Smoking status: Former Smoker    Packs/day: 1.50    Years: 30.00    Types: Cigarettes    Quit date: 07/21/2008  . Smokeless tobacco: Never Used  . Alcohol use No  . Drug use: No  . Sexual activity: No   Other Topics Concern  . Not on file   Social History Narrative  . No narrative on file    Past Surgical History:  Procedure Laterality Date  . BASAL CELL CARCINOMA EXCISION     face    Family History  Problem Relation Age of Onset  . Emphysema Father   . COPD Father   . Heart disease Father   . Stroke Father   . Heart disease Paternal Uncle   . Heart disease Maternal Grandfather   . Rheum arthritis Mother   . Diabetes Mother   . Diabetes Brother     Allergies as of 06/15/2017   No Known Allergies     Medication List       Accurate as of 06/15/17  2:36 PM. Always use your most recent med list.          albuterol 108 (90 Base) MCG/ACT inhaler Commonly known as:  PROAIR HFA Inhale 2 puffs into the  lungs every 4 (four) hours as needed.   ipratropium-albuterol 0.5-2.5 (3) MG/3ML Soln Commonly known as:  DUONEB Take 3 mLs by nebulization every 6 (six) hours as needed.   lisinopril 20 MG tablet Commonly known as:  PRINIVIL,ZESTRIL Take 1 tablet (20 mg total) by mouth daily.   loratadine 10 MG tablet Commonly known as:  CLARITIN Take 10 mg by mouth daily.   roflumilast 500 MCG Tabs tablet Commonly known as:  DALIRESP Take 1 tablet (500 mcg total) by mouth daily.   SYMBICORT 160-4.5 MCG/ACT inhaler Generic drug:  budesonide-formoterol INHALE 2 PUFFS INTO THE LUNGS 2 (TWO) TIMES DAILY.          Objective:    BP (!) 171/87   Pulse (!) 105   Temp 99.3 F (37.4 C)  (Oral)   Ht _0  (1.854 m)   Wt 237 lb 8 oz (107.7 kg)   BMI 31.33 kg/m   Wt Readings from Last 3 Encounters:  06/15/17 237 lb 8 oz (107.7 kg)  01/15/17 241 lb 12.8 oz (109.7 kg)  06/09/16 240 lb (108.9 kg)    Physical Exam  Constitutional: He is oriented to person, place, and time. He appears well-developed and well-nourished. No distress.  Eyes: Conjunctivae are normal. No scleral icterus.  Neck: Neck supple. No thyromegaly present.  Cardiovascular: Normal rate, regular rhythm, normal heart sounds and intact distal pulses.   No murmur heard. Pulmonary/Chest: Effort normal and breath sounds normal. No respiratory distress. He has no wheezes. He has no rales.  Musculoskeletal: Normal range of motion. He exhibits no edema.  Lymphadenopathy:    He has no cervical adenopathy.  Neurological: He is alert and oriented to person, place, and time. Coordination normal.  Skin: Skin is warm and dry. No rash noted. He is not diaphoretic.  Psychiatric: He has a normal mood and affect. His behavior is normal. Thought content normal.  Nursing note and vitals reviewed.      Assessment & Plan:   Problem List Items Addressed This Visit      Cardiovascular and Mediastinum   Essential hypertension - Primary   Relevant Medications   lisinopril (PRINIVIL,ZESTRIL) 20 MG tablet   Other Relevant Orders   CMP14+EGFR    Other Visit Diagnoses    Lipid screening       Relevant Orders   Lipid panel   Diabetes mellitus screening       Relevant Orders   CMP14+EGFR       Follow up plan: Return in about 4 weeks (around 07/13/2017), or if symptoms worsen or fail to improve, for htn recheck.  Caryl Pina, MD [facility] 06/15/2017, 2:31 PM

## 2017-06-16 ENCOUNTER — Telehealth: Payer: Self-pay

## 2017-06-16 ENCOUNTER — Telehealth: Payer: Self-pay | Admitting: Family Medicine

## 2017-06-16 LAB — CMP14+EGFR
ALT: 31 IU/L (ref 0–44)
AST: 20 IU/L (ref 0–40)
Albumin/Globulin Ratio: 1.4 (ref 1.2–2.2)
Albumin: 4.2 g/dL (ref 3.6–4.8)
Alkaline Phosphatase: 112 IU/L (ref 39–117)
BUN/Creatinine Ratio: 14 (ref 10–24)
BUN: 9 mg/dL (ref 8–27)
Bilirubin Total: 0.7 mg/dL (ref 0.0–1.2)
CALCIUM: 9.2 mg/dL (ref 8.6–10.2)
CO2: 21 mmol/L (ref 20–29)
CREATININE: 0.66 mg/dL — AB (ref 0.76–1.27)
Chloride: 95 mmol/L — ABNORMAL LOW (ref 96–106)
GFR calc Af Amer: 121 mL/min/{1.73_m2} (ref >59)
GFR, EST NON AFRICAN AMERICAN: 105 mL/min/{1.73_m2} (ref >59)
Globulin, Total: 3.1 g/dL (ref 1.5–4.5)
Glucose: 335 mg/dL — ABNORMAL HIGH (ref 65–99)
POTASSIUM: 3.9 mmol/L (ref 3.5–5.2)
Sodium: 135 mmol/L (ref 134–144)
Total Protein: 7.3 g/dL (ref 6.0–8.5)

## 2017-06-16 LAB — LIPID PANEL
CHOL/HDL RATIO: 7.3 ratio — AB (ref 0.0–5.0)
Cholesterol, Total: 299 mg/dL — ABNORMAL HIGH (ref 100–199)
HDL: 41 mg/dL (ref >39)
Triglycerides: 902 mg/dL (ref 0–149)

## 2017-06-16 MED ORDER — METFORMIN HCL 500 MG PO TABS: ORAL_TABLET | ORAL | 3 refills | Status: DC

## 2017-06-16 NOTE — Telephone Encounter (Signed)
Patient spoke with Dr. Warrick Parisian and is aware of labwork.  Appointment with Dr. Livia Snellen on 06/22/17.

## 2017-06-16 NOTE — Telephone Encounter (Signed)
Patient called back and spoke with him about the new diabetes and starting the metformin. Also put him through to scheduler so he can get set up for a week appointment to come back and see either a provider or Philomena Course for diabetes education Caryl Pina, MD [facility] 06/16/2017, 8:04 AM

## 2017-06-16 NOTE — Telephone Encounter (Signed)
rx refilled.

## 2017-06-16 NOTE — Telephone Encounter (Signed)
Patient wanted to have his metformin sent to [facility] not [facility]. Rx canceled at [facility] and sent to [facility].

## 2017-06-22 ENCOUNTER — Encounter: Payer: Self-pay | Admitting: Family Medicine

## 2017-06-22 ENCOUNTER — Ambulatory Visit (INDEPENDENT_AMBULATORY_CARE_PROVIDER_SITE_OTHER): Payer: 59 | Admitting: Family Medicine

## 2017-06-22 VITALS — BP 130/72 | HR 91 | Temp 98.8°F | Ht 73.0 in | Wt 232.0 lb

## 2017-06-22 DIAGNOSIS — E119 Type 2 diabetes mellitus without complications: Secondary | ICD-10-CM | POA: Diagnosis not present

## 2017-06-22 LAB — BAYER DCA HB A1C WAIVED: HB A1C (BAYER DCA - WAIVED): 11.7 % — ABNORMAL HIGH (ref <7.0)

## 2017-06-22 LAB — GLUCOSE HEMOCUE WAIVED: Glu Hemocue Waived: 215 mg/dL — ABNORMAL HIGH (ref 65–99)

## 2017-06-22 MED ORDER — BLOOD GLUCOSE MONITOR SYSTEM W/DEVICE KIT: PACK | 0 refills | Status: DC

## 2017-06-22 NOTE — Patient Instructions (Signed)
Diabetes and Foot Care Diabetes may cause you to have problems because of poor blood supply (circulation) to your feet and legs. This may cause the skin on your feet to become thinner, break easier, and heal more slowly. Your skin may become dry, and the skin may peel and crack. You may also have nerve damage in your legs and feet causing decreased feeling in them. You may not notice minor injuries to your feet that could lead to infections or more serious problems. Taking care of your feet is one of the most important things you can do for yourself. Follow these instructions at home:  Wear shoes at all times, even in the house. Do not go barefoot. Bare feet are easily injured.  Check your feet daily for blisters, cuts, and redness. If you cannot see the bottom of your feet, use a mirror or ask someone for help.  Wash your feet with warm water (do not use hot water) and mild soap. Then pat your feet and the areas between your toes until they are completely dry. Do not soak your feet as this can dry your skin.  Apply a moisturizing lotion or petroleum jelly (that does not contain alcohol and is unscented) to the skin on your feet and to dry, brittle toenails. Do not apply lotion between your toes.  Trim your toenails straight across. Do not dig under them or around the cuticle. File the edges of your nails with an emery board or nail file.  Do not cut corns or calluses or try to remove them with medicine.  Wear clean socks or stockings every day. Make sure they are not too tight. Do not wear knee-high stockings since they may decrease blood flow to your legs.  Wear shoes that fit properly and have enough cushioning. To break in new shoes, wear them for just a few hours a day. This prevents you from injuring your feet. Always look in your shoes before you put them on to be sure there are no objects inside.  Do not cross your legs. This may decrease the blood flow to your feet.  If you find a  minor scrape, cut, or break in the skin on your feet, keep it and the skin around it clean and dry. These areas may be cleansed with mild soap and water. Do not cleanse the area with peroxide, alcohol, or iodine.  When you remove an adhesive bandage, be sure not to damage the skin around it.  If you have a wound, look at it several times a day to make sure it is healing.  Do not use heating pads or hot water bottles. They may burn your skin. If you have lost feeling in your feet or legs, you may not know it is happening until it is too late.  Make sure your health care provider performs a complete foot exam at least annually or more often if you have foot problems. Report any cuts, sores, or bruises to your health care provider immediately. Contact a health care provider if:  You have an injury that is not healing.  You have cuts or breaks in the skin.  You have an ingrown nail.  You notice redness on your legs or feet.  You feel burning or tingling in your legs or feet.  You have pain or cramps in your legs and feet.  Your legs or feet are numb.  Your feet always feel cold. Get help right away if:  There is increasing   redness, swelling, or pain in or around a wound.  There is a red line that goes up your leg.  Pus is coming from a wound.  You develop a fever or as directed by your health care provider.  You notice a bad smell coming from an ulcer or wound. This information is not intended to replace advice given to you by your health care provider. Make sure you discuss any questions you have with your health care provider. Document Released: 12/04/2000 Document Revised: 05/14/2016 Document Reviewed: 05/16/2013 Elsevier Interactive Patient Education  2017 [place]. Diabetes Mellitus and Exercise Exercising regularly is important for your overall health, especially when you have diabetes (diabetes mellitus). Exercising is not only about losing weight. It has many health  benefits, such as increasing muscle strength and bone density and reducing body fat and stress. This leads to improved fitness, flexibility, and endurance, all of which result in better overall health. Exercise has additional benefits for people with diabetes, including:  Reducing appetite.  Helping to lower and control blood glucose.  Lowering blood pressure.  Helping to control amounts of fatty substances (lipids) in the blood, such as cholesterol and triglycerides.  Helping the body to respond better to insulin (improving insulin sensitivity).  Reducing how much insulin the body needs.  Decreasing the risk for heart disease by: ? Lowering cholesterol and triglyceride levels. ? Increasing the levels of good cholesterol. ? Lowering blood glucose levels.  What is my activity plan? Your health care provider or certified diabetes educator can help you make a plan for the type and frequency of exercise (activity plan) that works for you. Make sure that you:  Do at least 150 minutes of moderate-intensity or vigorous-intensity exercise each week. This could be brisk walking, biking, or water aerobics. ? Do stretching and strength exercises, such as yoga or weightlifting, at least 2 times a week. ? Spread out your activity over at least 3 days of the week.  Get some form of physical activity every day. ? Do not go more than 2 days in a row without some kind of physical activity. ? Avoid being inactive for more than 90 minutes at a time. Take frequent breaks to walk or stretch.  Choose a type of exercise or activity that you enjoy, and set realistic goals.  Start slowly, and gradually increase the intensity of your exercise over time.  What do I need to know about managing my diabetes?  Check your blood glucose before and after exercising. ? If your blood glucose is higher than 240 mg/dL (13.3 mmol/L) before you exercise, check your urine for ketones. If you have ketones in your urine,  do not exercise until your blood glucose returns to normal.  Know the symptoms of low blood glucose (hypoglycemia) and how to treat it. Your risk for hypoglycemia increases during and after exercise. Common symptoms of hypoglycemia can include: ? Hunger. ? Anxiety. ? Sweating and feeling clammy. ? Confusion. ? Dizziness or feeling light-headed. ? Increased heart rate or palpitations. ? Blurry vision. ? Tingling or numbness around the mouth, lips, or tongue. ? Tremors or shakes. ? Irritability.  Keep a rapid-acting carbohydrate snack available before, during, and after exercise to help prevent or treat hypoglycemia.  Avoid injecting insulin into areas of the body that are going to be exercised. For example, avoid injecting insulin into: ? The arms, when playing tennis. ? The legs, when jogging.  Keep records of your exercise habits. Doing this can help you and  your health care provider adjust your diabetes management plan as needed. Write down: ? Food that you eat before and after you exercise. ? Blood glucose levels before and after you exercise. ? The type and amount of exercise you have done. ? When your insulin is expected to peak, if you use insulin. Avoid exercising at times when your insulin is peaking.  When you start a new exercise or activity, work with your health care provider to make sure the activity is safe for you, and to adjust your insulin, medicines, or food intake as needed.  Drink plenty of water while you exercise to prevent dehydration or heat stroke. Drink enough fluid to keep your urine clear or pale yellow. This information is not intended to replace advice given to you by your health care provider. Make sure you discuss any questions you have with your health care provider. Document Released: 02/27/2004 Document Revised: 06/26/2016 Document Reviewed: 05/18/2016 Elsevier Interactive Patient Education  2018 [employer]. Carbohydrate Counting for Diabetes  [place], Adult Carbohydrate counting is a method for keeping track of how many carbohydrates you eat. Eating carbohydrates naturally increases the amount of sugar (glucose) in the blood. Counting how many carbohydrates you eat helps keep your blood glucose within normal limits, which helps you manage your diabetes (diabetes mellitus). It is important to know how many carbohydrates you can safely have in each meal. This is different for every person. A diet and nutrition specialist (registered dietitian) can help you make a meal plan and calculate how many carbohydrates you should have at each meal and snack. Carbohydrates are found in the following foods:  Grains, such as breads and cereals.  Dried beans and soy products.  Starchy vegetables, such as potatoes, peas, and corn.  Fruit and fruit juices.  Milk and yogurt.  Sweets and snack foods, such as cake, cookies, candy, chips, and soft drinks.  How do I count carbohydrates? There are two ways to count carbohydrates in food. You can use either of the methods or a combination of both. Reading "Nutrition Facts" on packaged food The "Nutrition Facts" list is included on the labels of almost all packaged foods and beverages in the [place] It includes:  The serving size.  Information about nutrients in each serving, including the grams (g) of carbohydrate per serving.  To use the "Nutrition Facts":  Decide how many servings you will have.  Multiply the number of servings by the number of carbohydrates per serving.  The resulting number is the total amount of carbohydrates that you will be having.  Learning standard serving sizes of other foods When you eat foods containing carbohydrates that are not packaged or do not include "Nutrition Facts" on the label, you need to measure the servings in order to count the amount of carbohydrates:  Measure the foods that you will eat with a food scale or measuring cup, if needed.  Decide how  many standard-size servings you will eat.  Multiply the number of servings by 15. Most carbohydrate-rich foods have about 15 g of carbohydrates per serving. ? For example, if you eat 8 oz (170 g) of strawberries, you will have eaten 2 servings and 30 g of carbohydrates (2 servings x 15 g = 30 g).  For foods that have more than one food mixed, such as soups and casseroles, you must count the carbohydrates in each food that is included.  The following list contains standard serving sizes of common carbohydrate-rich foods. Each of these servings has  about 15 g of carbohydrates:   hamburger bun or  English muffin.   oz (15 mL) syrup.   oz (14 g) jelly.  1 slice of bread.  1 six-inch tortilla.  3 oz (85 g) cooked rice or pasta.  4 oz (113 g) cooked dried beans.  4 oz (113 g) starchy vegetable, such as peas, corn, or potatoes.  4 oz (113 g) hot cereal.  4 oz (113 g) mashed potatoes or  of a large baked potato.  4 oz (113 g) canned or frozen fruit.  4 oz (120 mL) fruit juice.  4-6 crackers.  6 chicken nuggets.  6 oz (170 g) unsweetened dry cereal.  6 oz (170 g) plain fat-free yogurt or yogurt sweetened with artificial sweeteners.  8 oz (240 mL) milk.  8 oz (170 g) fresh fruit or one small piece of fruit.  24 oz (680 g) popped popcorn.  Example of carbohydrate counting Sample meal  3 oz (85 g) chicken breast.  6 oz (170 g) brown rice.  4 oz (113 g) corn.  8 oz (240 mL) milk.  8 oz (170 g) strawberries with sugar-free whipped topping. Carbohydrate calculation 1. Identify the foods that contain carbohydrates: ? Rice. ? Corn. ? Milk. ? Strawberries. 2. Calculate how many servings you have of each food: ? 2 servings rice. ? 1 serving corn. ? 1 serving milk. ? 1 serving strawberries. 3. Multiply each number of servings by 15 g: ? 2 servings rice x 15 g = 30 g. ? 1 serving corn x 15 g = 15 g. ? 1 serving milk x 15 g = 15 g. ? 1 serving strawberries x  15 g = 15 g. 4. Add together all of the amounts to find the total grams of carbohydrates eaten: ? 30 g + 15 g + 15 g + 15 g = 75 g of carbohydrates total. This information is not intended to replace advice given to you by your health care provider. Make sure you discuss any questions you have with your health care provider. Document Released: 12/07/2005 Document Revised: 06/26/2016 Document Reviewed: 05/20/2016 Elsevier Interactive Patient Education  [employer].

## 2017-06-22 NOTE — Progress Notes (Signed)
Subjective:  Patient ID: Craig Arnold, male    DOB: Nov 28, 1956  Age: 61 y.o. MRN: [id number]  CC: Follow-up (pt here today to discuss new dx of diabetes )   HPI [name] presents for discussion of new onset of diabetes. His A1c was borderline I urinary ago for prediabetes. At his recent physical and was noted to be markedly elevated. patient has had some polyuria, polydipsia and nausea. However no overt symptoms as these were mild.  History [name] has a past medical history of Allergic rhinitis; Asthma; Atypical mycobacterial infection; Bronchiectasis; COPD (chronic obstructive pulmonary disease) ([place]); Cough; GERD (gastroesophageal reflux disease); HTN (hypertension); and Pulmonary nodule.   He has a past surgical history that includes Excision basal cell carcinoma.   His family history includes COPD in his father; Diabetes in his brother and mother; Emphysema in his father; Heart disease in his father, maternal grandfather, and paternal uncle; Rheum arthritis in his mother; Stroke in his father.He reports that he quit smoking about 8 years ago. His smoking use included Cigarettes. He has a 45.00 pack-year smoking history. He has never used smokeless tobacco. He reports that he does not drink alcohol or use drugs.  Current Outpatient Prescriptions on File Prior to Visit  Medication Sig Dispense Refill  . albuterol (PROAIR HFA) 108 (90 Base) MCG/ACT inhaler Inhale 2 puffs into the lungs every 4 (four) hours as needed. 3 Inhaler 1  . ipratropium-albuterol (DUONEB) 0.5-2.5 (3) MG/3ML SOLN Take 3 mLs by nebulization every 6 (six) hours as needed. 360 mL 3  . lisinopril (PRINIVIL,ZESTRIL) 20 MG tablet Take 1 tablet (20 mg total) by mouth daily. 30 tablet 1  . loratadine (CLARITIN) 10 MG tablet Take 10 mg by mouth daily.      . metFORMIN (GLUCOPHAGE) 500 MG tablet Start with 1 pill twice daily 1st week, increase to 2 pills twice daily after 1st week, if has diarrhea stay at 1 pill twice daily  until pas 120 tablet 3  . roflumilast (DALIRESP) 500 MCG TABS tablet Take 1 tablet (500 mcg total) by mouth daily. 90 tablet 1  . SYMBICORT 160-4.5 MCG/ACT inhaler INHALE 2 PUFFS INTO THE LUNGS 2 (TWO) TIMES DAILY. 30.6 Inhaler 0   No current facility-administered medications on file prior to visit.     ROS Review of Systems Unremarkable except as per history of present illness  Objective:  BP 130/72   Pulse 91   Temp 98.8 F (37.1 C) (Oral)   Ht _0  (1.854 m)   Wt 232 lb (105.2 kg)   BMI 30.61 kg/m   BP Readings from Last 3 Encounters:  06/22/17 130/72  06/15/17 (!) 171/87  01/15/17 (!) 160/60    Wt Readings from Last 3 Encounters:  06/22/17 232 lb (105.2 kg)  06/15/17 237 lb 8 oz (107.7 kg)  01/15/17 241 lb 12.8 oz (109.7 kg)     Physical Exam  Constitutional: He is oriented to person, place, and time. He appears well-developed and well-nourished. No distress.  HENT:  Head: Normocephalic and atraumatic.  Eyes: Pupils are equal, round, and reactive to light. EOM are normal.  Neck: Normal range of motion.  Cardiovascular: Normal rate and regular rhythm.   Pulmonary/Chest: Effort normal.  Neurological: He is alert and oriented to person, place, and time. No cranial nerve deficit. He exhibits normal muscle tone. Coordination normal.  Skin: Skin is warm and dry.  Psychiatric: He has a normal mood and affect. His behavior is normal.    No  components found for: BAYER     Assessment & Plan:   Isak was seen today for follow-up.  Diagnoses and all orders for this visit:  Diabetes mellitus without complication ([place]) -     Bayer DCA Hb A1c Waived -     Glucose Hemocue Waived  Other orders -     Blood Glucose Monitoring Suppl (BLOOD GLUCOSE MONITOR SYSTEM) w/Device KIT; Use to check Blood Sugar up to 4x daily. Dispense per pt insurance preference.   One half hour was spent evaluating this patient. More than one half in consultation regarding his new diabetes  diagnosis. We discussed his diet and exercise program as well as the need to lose weight. Medication was started. Monitoring his blood sugar twice daily as a fasting and two-hour postprandial number was reviewed.   I am having Mr. [name] start on Blood Glucose Monitor System. I am also having him maintain his loratadine, ipratropium-albuterol, SYMBICORT, albuterol, roflumilast, lisinopril, and metFORMIN.  Meds ordered this encounter  Medications  . Blood Glucose Monitoring Suppl (BLOOD GLUCOSE MONITOR SYSTEM) w/Device KIT    Sig: Use to check Blood Sugar up to 4x daily. Dispense per pt insurance preference.    Dispense:  1 each    Refill:  0     Follow-up: Return in about 1 month (around 07/23/2017).  Claretta Fraise, M.D.

## 2017-07-07 ENCOUNTER — Encounter: Payer: Self-pay | Admitting: Pharmacist

## 2017-07-07 ENCOUNTER — Ambulatory Visit (INDEPENDENT_AMBULATORY_CARE_PROVIDER_SITE_OTHER): Payer: 59 | Admitting: Pharmacist

## 2017-07-07 VITALS — BP 136/78 | HR 74 | Ht 73.0 in | Wt 229.0 lb

## 2017-07-07 DIAGNOSIS — E1165 Type 2 diabetes mellitus with hyperglycemia: Secondary | ICD-10-CM

## 2017-07-07 NOTE — Patient Instructions (Signed)
Try to avoid sodas.  You can try Crystal Light NATURAL in water for flavor.  Diabetes and Standards of Medical Care   Diabetes is complicated. You may find that your diabetes team includes a dietitian, nurse, diabetes educator, eye doctor, and more. To help everyone know what is going on and to help you get the care you deserve, the following schedule of care was developed to help keep you on track. Below are the tests, exams, vaccines, medicines, education, and plans you will need.  Blood Glucose Goals Prior to meals = 80 - 130 Within 2 hours of the start of a meal = less than 180  HbA1c test (goal is less than 7.0% - your last value was 11.7%) This test shows how well you have controlled your glucose over the past 2 to 3 months. It is used to see if your diabetes management plan needs to be adjusted.   It is performed at least 2 times a year if you are meeting treatment goals.  It is performed 4 times a year if therapy has changed or if you are not meeting treatment goals.  Blood pressure test  This test is performed at every routine medical visit. The goal is less than 140/90 mmHg for most people, but 130/80 mmHg in some cases. Ask your health care provider about your goal.  Dental exam  Follow up with the dentist regularly.  Eye exam  If you are diagnosed with type 1 diabetes as a child, get an exam upon reaching the age of 10 years or older and have had diabetes for 3 to 5 years. Yearly eye exams are recommended after that initial eye exam.  If you are diagnosed with type 1 diabetes as an adult, get an exam within 5 years of diagnosis and then yearly.  If you are diagnosed with type 2 diabetes, get an exam as soon as possible after the diagnosis and then yearly.  Foot care exam  Visual foot exams are performed at every routine medical visit. The exams check for cuts, injuries, or other problems with the feet.  A comprehensive foot exam should be done yearly. This includes  visual inspection as well as assessing foot pulses and testing for loss of sensation.  Check your feet nightly for cuts, injuries, or other problems with your feet. Tell your health care provider if anything is not healing.  Kidney function test (urine microalbumin)  This test is performed once a year.  Type 1 diabetes: The first test is performed 5 years after diagnosis.  Type 2 diabetes: The first test is performed at the time of diagnosis.  A serum creatinine and estimated glomerular filtration rate (eGFR) test is done once a year to assess the level of chronic kidney disease (CKD), if present.  Lipid profile (cholesterol, HDL, LDL, triglycerides)  Performed every 5 years for most people.  The goal for LDL is less than 100 mg/dL. If you are at high risk, the goal is less than 70 mg/dL.  The goal for HDL is 40 mg/dL to 50 mg/dL for men and 50 mg/dL to 60 mg/dL for women. An HDL cholesterol of 60 mg/dL or higher gives some protection against heart disease.  The goal for triglycerides is less than 150 mg/dL.  Influenza vaccine, pneumococcal vaccine, and hepatitis B vaccine  The influenza vaccine is recommended yearly.  The pneumococcal vaccine is generally given once in a lifetime. However, there are some instances when another vaccination is recommended. Check with your  health care provider.  The hepatitis B vaccine is also recommended for adults with diabetes.  Diabetes self-management education  Education is recommended at diagnosis and ongoing as needed.  Treatment plan  Your treatment plan is reviewed at every medical visit.  Document Released: 10/04/2009 Document Revised: 08/09/2013 Document Reviewed: 05/09/2013 [facility] Patient Information 2014 [place].

## 2017-07-07 NOTE — Progress Notes (Signed)
Patient ID: Craig Arnold, male   DOB: November 14, 1956, 61 y.o.   MRN: [id number]   Subjective:    Craig Arnold is a 61 y.o. male who presents for an initial evaluation of Type 2 diabetes mellitus and education.  Newly diagnosed with DM 05/2017.  Current symptoms/problems include hyperglycemia and have been improving. Symptoms have been present for 1 month.  Current diabetic medications include metformin 500mg  2 tablets bid.   The patient was initially diagnosed with Type 2 diabetes mellitus based on the following criteria:  FBG of 335 and A1c of 11.7%.  Current monitoring regimen: home blood tests - 1-2 times daily Home blood sugar records: fasting range: 123 to 167 and postprandial range: 180 to 211 Any episodes of hypoglycemia? no  Known diabetic complications: none Cardiovascular risk factors: advanced age (older than 31 for men, 4 for women), diabetes mellitus, dyslipidemia, family history of premature cardiovascular disease, hypertension and male gender Eye exam current (within one year): no Weight trend: decreasing steadily Prior visit with CDE: no Current diet: in general, an "unhealthy" diet Current exercise: none Medication Compliance?  Yes   Is He on ACE inhibitor or angiotensin II receptor blocker?  Yes  lisinopril (Prinivil)    The following portions of the patient's history were reviewed and updated as appropriate: allergies, current medications, past family history, past medical history, past social history, past surgical history and problem list.    Objective:    There were no vitals taken for this visit.  Lab Review Glucose (mg/dL)  Date Value  06/15/2017 335 (H)   Glucose, Bld (mg/dL)  Date Value  09/19/2008 103 (H)  08/16/2007 118 (H)   CO2  Date Value  06/15/2017 21 mmol/L  09/19/2008 28 meq/L  08/16/2007 27 meq/L   BUN (mg/dL)  Date Value  06/15/2017 9  09/19/2008 6  08/16/2007 8   Creatinine, Ser (mg/dL)  Date Value  06/15/2017 0.66 (L)   09/19/2008 0.8  08/16/2007 0.8      Assessment:    Diabetes Mellitus type II, under improving control.    Plan:    1.  Rx changes: none 2.  Education: Reviewed 'ABCs' of diabetes management (respective goals in parentheses):  A1C (<7), blood pressure (<130/80), and cholesterol (LDL <100). 3. Discussed pathophysiology of DM; difference between type 1 and type 2 DM. 4. CHO counting diet discussed.  Reviewed CHO amount in various foods and how to read nutrition labels.  Discussed recommended serving sizes.  5.  Recommend check BG 1-2times a day 6. Follow up: 1 week with PCP

## 2017-07-13 ENCOUNTER — Ambulatory Visit (INDEPENDENT_AMBULATORY_CARE_PROVIDER_SITE_OTHER): Payer: 59 | Admitting: Family Medicine

## 2017-07-13 ENCOUNTER — Encounter: Payer: Self-pay | Admitting: Family Medicine

## 2017-07-13 VITALS — BP 138/79 | HR 76 | Temp 97.5°F | Ht 73.0 in | Wt 230.0 lb

## 2017-07-13 DIAGNOSIS — E1169 Type 2 diabetes mellitus with other specified complication: Secondary | ICD-10-CM | POA: Insufficient documentation

## 2017-07-13 DIAGNOSIS — I10 Essential (primary) hypertension: Secondary | ICD-10-CM

## 2017-07-13 DIAGNOSIS — E119 Type 2 diabetes mellitus without complications: Secondary | ICD-10-CM | POA: Diagnosis not present

## 2017-07-13 MED ORDER — LISINOPRIL 20 MG PO TABS: 20.0000 mg | ORAL_TABLET | Freq: Every day | ORAL | 1 refills | Status: DC

## 2017-07-13 NOTE — Progress Notes (Signed)
BP 138/79   Pulse 76   Temp (!) 97.5 F (36.4 C) (Oral)   Ht 6\' 1"  (1.854 m)   Wt 230 lb (104.3 kg)   BMI 30.34 kg/m    Subjective:    Patient ID: Craig Arnold, male    DOB: [birth year], 61 y.o.   MRN: [id number]  HPI: Craig Arnold is a 61 y.o. male presenting on 07/13/2017 for Hypertension (4 week followup)   HPI Type 2 diabetes mellitus Patient comes in today for recheck of his diabetes. Patient has been currently taking Metformin, he has not been checking his blood sugars but has not noticed anything abnormal. Patient is currently on an ACE inhibitor/ARB. Patient has not seen an ophthalmologist this year. Patient denies any issues with their feet.   Hypertension Patient is currently on lisinopril 20, and their blood pressure today is 138/79. Patient denies any lightheadedness or dizziness. Patient denies headaches, blurred vision, chest pains, shortness of breath, or weakness. Denies any side effects from medication and is content with current medication.   Relevant past medical, surgical, family and social history reviewed and updated as indicated. Interim medical history since our last visit reviewed. Allergies and medications reviewed and updated.  Review of Systems  Constitutional: Negative for chills and fever.  Respiratory: Negative for shortness of breath and wheezing.   Cardiovascular: Negative for chest pain and leg swelling.  Musculoskeletal: Negative for back pain and gait problem.  Skin: Negative for rash.  Neurological: Negative for dizziness, weakness, light-headedness and numbness.  All other systems reviewed and are negative.   Per HPI unless specifically indicated above        Objective:    BP 138/79   Pulse 76   Temp (!) 97.5 F (36.4 C) (Oral)   Ht 6\' 1"  (1.854 m)   Wt 230 lb (104.3 kg)   BMI 30.34 kg/m   Wt Readings from Last 3 Encounters:  07/13/17 230 lb (104.3 kg)  07/07/17 229 lb (103.9 kg)  06/22/17 232 lb (105.2 kg)    Physical  Exam  Constitutional: He is oriented to person, place, and time. He appears well-developed and well-nourished. No distress.  Eyes: Conjunctivae are normal. No scleral icterus.  Cardiovascular: Normal rate, regular rhythm, normal heart sounds and intact distal pulses.   No murmur heard. Pulmonary/Chest: Effort normal and breath sounds normal. No respiratory distress. He has no wheezes.  Musculoskeletal: Normal range of motion. He exhibits no edema.  Neurological: He is alert and oriented to person, place, and time. Coordination normal.  Skin: Skin is warm and dry. No rash noted. He is not diaphoretic.  Psychiatric: He has a normal mood and affect. His behavior is normal.  Nursing note and vitals reviewed.   Results for orders placed or performed in visit on 06/22/17  Bayer DCA Hb A1c Waived  Result Value Ref Range   Bayer DCA Hb A1c Waived 11.7 (H) <7.0 %  Glucose Hemocue Waived  Result Value Ref Range   Glu Hemocue Waived 215 (H) 65 - 99 mg/dL      Assessment & Plan:   Problem List Items Addressed This Visit      Cardiovascular and Mediastinum   Essential hypertension - Primary   Relevant Medications   lisinopril (PRINIVIL,ZESTRIL) 20 MG tablet     Endocrine   Type 2 diabetes mellitus without complications (HCC)   Relevant Medications   lisinopril (PRINIVIL,ZESTRIL) 20 MG tablet      Blood pressure much improved, we will continue  to monitor his diabetes and have him return in 2 months for an A1c recheck  Follow up plan: Return in about 2 months (around 09/13/2017), or if symptoms worsen or fail to improve, for Recheck diabetes and hypertension.  Counseling provided for all of the vaccine components No orders of the defined types were placed in this encounter.   Caryl Pina, MD [facility] 07/13/2017, 9:33 AM

## 2017-07-16 ENCOUNTER — Other Ambulatory Visit: Payer: Self-pay | Admitting: Family Medicine

## 2017-08-05 ENCOUNTER — Other Ambulatory Visit: Payer: Self-pay | Admitting: Family Medicine

## 2017-08-31 ENCOUNTER — Other Ambulatory Visit: Payer: Self-pay | Admitting: *Deleted

## 2017-08-31 ENCOUNTER — Ambulatory Visit: Payer: 59 | Admitting: Emergency Medicine

## 2017-08-31 MED ORDER — LISINOPRIL 20 MG PO TABS: 20.0000 mg | ORAL_TABLET | Freq: Every day | ORAL | 0 refills | Status: DC

## 2017-09-01 ENCOUNTER — Other Ambulatory Visit: Payer: Self-pay | Admitting: *Deleted

## 2017-09-01 MED ORDER — LISINOPRIL 20 MG PO TABS: 20.0000 mg | ORAL_TABLET | Freq: Every day | ORAL | 1 refills | Status: DC

## 2017-09-06 ENCOUNTER — Ambulatory Visit (INDEPENDENT_AMBULATORY_CARE_PROVIDER_SITE_OTHER): Payer: 59 | Admitting: Emergency Medicine

## 2017-09-06 ENCOUNTER — Encounter: Payer: Self-pay | Admitting: Emergency Medicine

## 2017-09-06 DIAGNOSIS — J449 Chronic obstructive pulmonary disease, unspecified: Secondary | ICD-10-CM

## 2017-09-06 DIAGNOSIS — A319 Mycobacterial infection, unspecified: Secondary | ICD-10-CM | POA: Diagnosis not present

## 2017-09-06 DIAGNOSIS — J45909 Unspecified asthma, uncomplicated: Secondary | ICD-10-CM | POA: Diagnosis not present

## 2017-09-06 NOTE — Assessment & Plan Note (Addendum)
He does have daily symptoms uses albuterol approximately 1-2 times a day. No acute exacerbations since last time. Appears to be stable on chronic pred 10mg . We will continue this, Symbicort, albuterol prn. He doesn't want the flu shot

## 2017-09-06 NOTE — Progress Notes (Signed)
Craig Arnold is a 61 year old man with COPD, chronic cough with chronic postnasal drip, and bronchodilator responsiveness.  Also has documented [facility], completed  therapy (3/09 - 10/09). Follow up sputum negative. Repeat 12/09 showed AFB negative, fungal negative normal flora. Have considered him for Xolair but IgE and eosinophils low in the past.   CT scan of the chest 10/09 showed improvement in nodular infiltrates. Then 12/09 and 08/2010 repeat CTs showed stable mild bronchiectasis and emphysematous changes.  Repeat sputum 12/10 showed AFB negative, fungal negative, normal flora   ROV 02/25/16 -- follow-up visit for moderate persistent asthma, COPD, history of mycobacterial disease (treated). He's been maintained on chronic prednisone currently at 10 mg a day. His other medications include Symbicort, Daliresp. He has been doing fairly well for months until 1 week ago when he developed some fatigue, weakness URI sx, poor sleep. He takes loratadine, mucinex. Uses albuterol few times a week. He is coughing up brown mucous.   ROV 06/09/16 -- patient has a history of moderate persistent asthma with associated COPD, treated mycobacterial disease, chronic prednisone use. At our last visit he was treated for acute bronchitis with Levaquin as well as a prednisone taper. He is now back down to his usual dose of 10 mg daily. He continues to have some "rough days". On a good day he won't need any SABA. No cough currently, does hear some intermittent wheeze. He has had some "swimmy-headedness" on a couple occasions in the last 2 weeks. Has not had syncope. He wonders if he is having a panic response when out at a store.   ROV 01/15/17 -- this follow-up visit for moderate persistent asthma and associated COPD, chronic prednisone use. He also has a history of treated mycobacterial disease (2009). Most recent sputa have been negative. He is managed on Symbicort, pred 10mg , daliresp, loratadine. Uses mucinex. No acute flares. He  does have some daily wheeze, worst in the cold weather. He uses SABA about 1x a day. His exercise tolerance is stable. He is able to do some housework, work outside. He has gained some weight since last visit. He does not want the flu shot.   ROV 09/06/17 -- she has a history of moderate persistent asthma with COPD, bronchiectasis. He is on chronic prednisone. We've also treated him in the past for mycobacterial disease. He has HTN, was started on lisinopril since our last visit. He reports that he has been doing fairly well. He had a recent worsening of his allergy sx. He took OTC decongestants. Occasional residual cough. Little change in his intermittent wheeze - responds to his albuterol. Uses SABA 1-2x a day. Using loratadine prn. He is on pred 10mg  daily.     EXAM:  Vitals:   09/06/17 1102  BP: 116/78  Pulse: 91  SpO2: 97%  Weight: 234 lb (106.1 kg)  Height: 6\' 1"  (1.854 m)    Gen: Pleasant, obese, in no distress,  normal affect, stutters  ENT: No lesions,  mouth clear,  oropharynx clear, no postnasal drip  Neck: No JVD, no Stridor  Lungs: Mostly clear. Focal expiratory wheezes left base  Cardiovascular: RRR, heart sounds normal, no murmur or gallops, no peripheral edema  Musculoskeletal: No deformities, no cyanosis or clubbing  Neuro: alert, non focal  Skin: Warm, no lesions or rashes   COPD (chronic obstructive pulmonary disease) (HCC) He does have daily symptoms uses albuterol approximately 1-2 times a day. No acute exacerbations since last time. Appears to be stable on chronic pred  10mg . We will continue this, Symbicort, albuterol prn. 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   Intrinsic asthma With cough. Need to ensure that the lisinopril does not change his cough. If so we can discuss an alternative  Craig Apo, MD, PhD 09/06/2017, 11:33 AM [facility] [phone] or if no answer  [phone]

## 2017-09-06 NOTE — Patient Instructions (Signed)
Please continue your current medications as you have been taking them Follow with Dr Lamonte Sakai in 6 months or sooner if you have any problems

## 2017-09-06 NOTE — Assessment & Plan Note (Signed)
With cough. Need to ensure that the lisinopril does not change his cough. If so we can discuss an alternative

## 2017-09-06 NOTE — Assessment & Plan Note (Signed)
Without any evidence recurrence.

## 2017-09-15 ENCOUNTER — Ambulatory Visit (INDEPENDENT_AMBULATORY_CARE_PROVIDER_SITE_OTHER): Payer: 59 | Admitting: Family Medicine

## 2017-09-15 ENCOUNTER — Encounter: Payer: Self-pay | Admitting: Family Medicine

## 2017-09-15 VITALS — BP 156/91 | HR 78 | Temp 98.2°F | Ht 73.0 in | Wt 235.0 lb

## 2017-09-15 DIAGNOSIS — I10 Essential (primary) hypertension: Secondary | ICD-10-CM

## 2017-09-15 DIAGNOSIS — B351 Tinea unguium: Secondary | ICD-10-CM

## 2017-09-15 DIAGNOSIS — K219 Gastro-esophageal reflux disease without esophagitis: Secondary | ICD-10-CM

## 2017-09-15 DIAGNOSIS — E119 Type 2 diabetes mellitus without complications: Secondary | ICD-10-CM

## 2017-09-15 LAB — BAYER DCA HB A1C WAIVED: HB A1C: 6.5 % (ref <7.0)

## 2017-09-15 MED ORDER — FREESTYLE LIBRE SENSOR SYSTEM MISC: 6 refills | Status: DC

## 2017-09-15 MED ORDER — TERBINAFINE HCL 250 MG PO TABS: 250.0000 mg | ORAL_TABLET | Freq: Every day | ORAL | 0 refills | Status: DC

## 2017-09-15 MED ORDER — FREESTYLE LIBRE READER DEVI: 1.0000 | Freq: Every day | 0 refills | Status: DC | PRN

## 2017-09-15 NOTE — Progress Notes (Signed)
BP (!) 156/91   Pulse 78   Temp 98.2 F (36.8 C) (Oral)   Ht _0  (1.854 m)   Wt 235 lb (106.6 kg)   BMI 31.00 kg/m    Subjective:    Patient ID: Craig Arnold, male    DOB: 1956-04-29, 61 y.o.   MRN: [id number]  HPI: Craig Arnold is a 61 y.o. male presenting on 09/15/2017 for Hypertension (2 mo) and Diabetes (discuss what to do when he checks BS and it is elevated; having some inconsistencies in readings)   HPI Type 2 diabetes mellitus Patient comes in today for recheck of his diabetes. Patient has been currently taking Metformin, he says his meter has been all over the place. Patient is currently on an ACE inhibitor/ARB. Patient has not seen an ophthalmologist this year. Patient Has thickened and yellowed toenails on both great toes and on his left fifth toe. He would like to seek treatment for that.   Hypertension Patient is currently on lisinopril, and their blood pressure today is 156/91 but has been normal the last 2 times he was seen so we will just monitor. Patient denies any lightheadedness or dizziness. Patient denies headaches, blurred vision, chest pains, shortness of breath, or weakness. Denies any side effects from medication and is content with current medication.   GERD Patient is currently on nothing and is doing well.  She denies any major symptoms or abdominal pain or belching or burping. She denies any blood in her stool or lightheadedness or dizziness.   Relevant past medical, surgical, family and social history reviewed and updated as indicated. Interim medical history since our last visit reviewed. Allergies and medications reviewed and updated.  Review of Systems  Constitutional: Negative for chills and fever.  Respiratory: Negative for shortness of breath and wheezing.   Cardiovascular: Negative for chest pain and leg swelling.  Gastrointestinal: Negative for abdominal pain, diarrhea, nausea and vomiting.  Musculoskeletal: Negative for back pain and gait  problem.  Skin: Negative for rash.  Neurological: Negative for dizziness, weakness, light-headedness and numbness.  All other systems reviewed and are negative.   Per HPI unless specifically indicated above   Allergies as of 09/15/2017   No Known Allergies     Medication List       Accurate as of 09/15/17 10:16 AM. Always use your most recent med list.          albuterol 108 (90 Base) MCG/ACT inhaler Commonly known as:  PROAIR HFA Inhale 2 puffs into the lungs every 4 (four) hours as needed.   Blood Glucose Monitor System w/Device Kit Use to check Blood Sugar up to 4x daily. Dispense per pt insurance preference.   FREESTYLE LIBRE READER Devi 1 applicator by Does not apply route daily as needed.   [place] Misc Apply one applicator every 10 days   ipratropium-albuterol 0.5-2.5 (3) MG/3ML Soln Commonly known as:  DUONEB Take 3 mLs by nebulization every 6 (six) hours as needed.   lisinopril 20 MG tablet Commonly known as:  PRINIVIL,ZESTRIL Take 1 tablet (20 mg total) by mouth daily.   loratadine 10 MG tablet Commonly known as:  CLARITIN Take 10 mg by mouth daily.   metFORMIN 500 MG tablet Commonly known as:  GLUCOPHAGE Start with 1 pill twice daily 1st week, increase to 2 pills twice daily after 1st week, if has diarrhea stay at 1 pill twice daily until pas   [place] 4  TIMES DAILY   ONETOUCH VERIO test strip Generic drug:  glucose blood TEST BLOOD SUGAR 4 TIMES DAILY   roflumilast 500 MCG Tabs tablet Commonly known as:  DALIRESP Take 1 tablet (500 mcg total) by mouth daily.   SYMBICORT 160-4.5 MCG/ACT inhaler Generic drug:  budesonide-formoterol INHALE 2 PUFFS INTO THE LUNGS 2 (TWO) TIMES DAILY.   terbinafine 250 MG tablet Commonly known as:  LAMISIL Take 1 tablet (250 mg total) by mouth daily.            Discharge Care Instructions        Start     Ordered   09/15/17 0000  Bayer  [facility] Hb A1c Waived     09/15/17 0858   09/15/17 0000  Continuous Blood Gluc Receiver (FREESTYLE LIBRE READER) DEVI  Daily PRN     09/15/17 0902   09/15/17 0000  Continuous Blood Gluc Sensor (FREESTYLE LIBRE SENSOR SYSTEM) MISC     09/15/17 0902   09/15/17 0000  terbinafine (LAMISIL) 250 MG tablet  Daily     09/15/17 1015         Objective:    BP (!) 156/91   Pulse 78   Temp 98.2 F (36.8 C) (Oral)   Ht _0  (1.854 m)   Wt 235 lb (106.6 kg)   BMI 31.00 kg/m   Wt Readings from Last 3 Encounters:  09/15/17 235 lb (106.6 kg)  09/06/17 234 lb (106.1 kg)  07/13/17 230 lb (104.3 kg)    Physical Exam  Constitutional: He is oriented to person, place, and time. He appears well-developed and well-nourished. No distress.  Eyes: Conjunctivae are normal. No scleral icterus.  Neck: Neck supple. No thyromegaly present.  Cardiovascular: Normal rate, regular rhythm, normal heart sounds and intact distal pulses.   No murmur heard. Pulmonary/Chest: Effort normal and breath sounds normal. No respiratory distress. He has no wheezes. He has no rales.  Musculoskeletal: Normal range of motion. He exhibits no edema.  Lymphadenopathy:    He has no cervical adenopathy.  Neurological: He is alert and oriented to person, place, and time. Coordination normal.  Skin: Skin is warm and dry. No rash noted. He is not diaphoretic.  Thickened and yellow toenails on both great toes and left fifth toe  Psychiatric: He has a normal mood and affect. His behavior is normal.  Nursing note and vitals reviewed.   Results for orders placed or performed in visit on 06/22/17  Bayer DCA Hb A1c Waived  Result Value Ref Range   Bayer DCA Hb A1c Waived 11.7 (H) <7.0 %  Glucose Hemocue Waived  Result Value Ref Range   Glu Hemocue Waived 215 (H) 65 - 99 mg/dL      Assessment & Plan:   Problem List Items Addressed This Visit      Cardiovascular and Mediastinum   Essential hypertension     Digestive   GERD      Endocrine   Type 2 diabetes mellitus without complications ([place]) - Primary   Relevant Orders   Bayer DCA Hb A1c Waived    Other Visit Diagnoses    Onychomycosis       Relevant Medications   terbinafine (LAMISIL) 250 MG tablet       Follow up plan: Return in about 3 months (around 12/15/2017), or if symptoms worsen or fail to improve, for DiabetesHypertension recheck.  Counseling provided for all of the vaccine components Orders Placed This Encounter  Procedures  . Bayer DCA Hb A1c  [place] Saher Davee, MD [facility] 09/15/2017, 10:16 AM

## 2017-10-07 ENCOUNTER — Other Ambulatory Visit: Payer: Self-pay | Admitting: Emergency Medicine

## 2017-10-13 ENCOUNTER — Other Ambulatory Visit: Payer: Self-pay | Admitting: Family Medicine

## 2017-10-20 ENCOUNTER — Other Ambulatory Visit: Payer: Self-pay | Admitting: Family Medicine

## 2017-11-07 ENCOUNTER — Other Ambulatory Visit: Payer: Self-pay | Admitting: Family Medicine

## 2017-11-22 ENCOUNTER — Other Ambulatory Visit: Payer: Self-pay | Admitting: Emergency Medicine

## 2017-11-25 ENCOUNTER — Other Ambulatory Visit: Payer: Self-pay | Admitting: Family Medicine

## 2017-11-25 DIAGNOSIS — B351 Tinea unguium: Secondary | ICD-10-CM

## 2017-12-17 ENCOUNTER — Encounter: Payer: Self-pay | Admitting: Family Medicine

## 2017-12-17 ENCOUNTER — Ambulatory Visit (INDEPENDENT_AMBULATORY_CARE_PROVIDER_SITE_OTHER): Payer: 59 | Admitting: Family Medicine

## 2017-12-17 VITALS — BP 138/80 | HR 81 | Temp 98.4°F | Ht 73.0 in | Wt 242.0 lb

## 2017-12-17 DIAGNOSIS — I10 Essential (primary) hypertension: Secondary | ICD-10-CM

## 2017-12-17 DIAGNOSIS — J449 Chronic obstructive pulmonary disease, unspecified: Secondary | ICD-10-CM

## 2017-12-17 DIAGNOSIS — L2084 Intrinsic (allergic) eczema: Secondary | ICD-10-CM | POA: Diagnosis not present

## 2017-12-17 DIAGNOSIS — E1159 Type 2 diabetes mellitus with other circulatory complications: Secondary | ICD-10-CM

## 2017-12-17 DIAGNOSIS — E119 Type 2 diabetes mellitus without complications: Secondary | ICD-10-CM | POA: Diagnosis not present

## 2017-12-17 DIAGNOSIS — Z1159 Encounter for screening for other viral diseases: Secondary | ICD-10-CM | POA: Diagnosis not present

## 2017-12-17 DIAGNOSIS — Z114 Encounter for screening for human immunodeficiency virus [HIV]: Secondary | ICD-10-CM

## 2017-12-17 DIAGNOSIS — K219 Gastro-esophageal reflux disease without esophagitis: Secondary | ICD-10-CM | POA: Diagnosis not present

## 2017-12-17 LAB — BAYER DCA HB A1C WAIVED: HB A1C (BAYER DCA - WAIVED): 6.4 % (ref <7.0)

## 2017-12-17 NOTE — Progress Notes (Signed)
BP 138/80   Pulse 81   Temp 98.4 F (36.9 C) (Oral)   Ht 6' 1"  (1.854 m)   Wt 242 lb (109.8 kg)   BMI 31.93 kg/m    Subjective:    Patient ID: Craig Arnold, male    DOB: [birth year], 61 y.o.   MRN: [id number]  HPI: Craig Arnold is a 61 y.o. male presenting on 12/17/2017 for Diabetes (3 mo, patient has not started using the [place] system, wants to continue using strips; patient is fasting); Hypertension; and Lesions on both arms (itch and sting)   HPI Type 2 diabetes mellitus Patient comes in today for recheck of his diabetes. Patient has been currently taking metformin. Patient is currently on an ACE inhibitor/ARB. Patient has not seen an ophthalmologist this year. Patient denies any issues with their feet.   Hypertension Patient is currently on lisinopril, and their blood pressure today is 138/80. Patient denies any lightheadedness or dizziness. Patient denies headaches, blurred vision, chest pains, shortness of breath, or weakness. Denies any side effects from medication and is content with current medication.   GERD Patient is currently on no medication but takes an over-the-counter Zantac as needed, no symptoms today.  She denies any major symptoms or abdominal pain or belching or burping. She denies any blood in her stool or lightheadedness or dizziness.   Rash Patient comes in complaining of a rash on both of his lower elbows that starts as a small spot that he scratches and it spreads more and is very pruritic.  He denies any redness or warmth or fevers or chills or pain with it but just has a lot of pruritus.  He says he gets like this every year around this time year.  COPD recheck Patient says he gets the occasional wheeze about once a week but says is doing very well on his and is continuing with those medications.  Relevant past medical, surgical, family and social history reviewed and updated as indicated. Interim medical history since our last visit reviewed. Allergies  and medications reviewed and updated.  Review of Systems  Constitutional: Negative for chills and fever.  Eyes: Negative for discharge.  Respiratory: Negative for shortness of breath and wheezing.   Cardiovascular: Negative for chest pain and leg swelling.  Gastrointestinal: Negative for abdominal pain.  Musculoskeletal: Negative for back pain and gait problem.  Skin: Positive for rash.  Neurological: Negative for dizziness, weakness, light-headedness and numbness.  All other systems reviewed and are negative.   Per HPI unless specifically indicated above      Objective:    BP 138/80   Pulse 81   Temp 98.4 F (36.9 C) (Oral)   Ht 6' 1"  (1.854 m)   Wt 242 lb (109.8 kg)   BMI 31.93 kg/m   Wt Readings from Last 3 Encounters:  12/17/17 242 lb (109.8 kg)  09/15/17 235 lb (106.6 kg)  09/06/17 234 lb (106.1 kg)    Physical Exam  Constitutional: He is oriented to person, place, and time. He appears well-developed and well-nourished. No distress.  Eyes: Conjunctivae are normal. No scleral icterus.  Neck: Neck supple. No thyromegaly present.  Cardiovascular: Normal rate, regular rhythm, normal heart sounds and intact distal pulses.  No murmur heard. Pulmonary/Chest: Effort normal and breath sounds normal. No respiratory distress. He has no wheezes. He has no rales.  Musculoskeletal: Normal range of motion. He exhibits no edema.  Lymphadenopathy:    He has no cervical adenopathy.  Neurological: He is alert  and oriented to person, place, and time. Coordination normal.  Skin: Skin is warm and dry. No rash noted. He is not diaphoretic.  Psychiatric: He has a normal mood and affect. His behavior is normal.  Nursing note and vitals reviewed.   Diabetic Foot Exam - Simple   Simple Foot Form Diabetic Foot exam was performed with the following findings:  Yes 12/17/2017 10:35 AM  Visual Inspection No deformities, no ulcerations, no other skin breakdown bilaterally:  Yes Sensation  Testing Intact to touch and monofilament testing bilaterally:  Yes Pulse Check Posterior Tibialis and Dorsalis pulse intact bilaterally:  Yes Comments Patient does have small callus on the inside of left fifth toe.  No erythema or warmth or cracks noted.  Recommended moleskin to relieve pressure     Results for orders placed or performed in visit on 09/15/17  Bayer DCA Hb A1c Waived  Result Value Ref Range   Bayer DCA Hb A1c Waived 6.5 <7.0 %      Assessment & Plan:   Problem List Items Addressed This Visit      Cardiovascular and Mediastinum   Hypertension associated with diabetes ([place])   Relevant Orders   CMP14+EGFR   Lipid panel     Respiratory   COPD (chronic obstructive pulmonary disease) (HCC)     Digestive   GERD     Endocrine   Type 2 diabetes mellitus without complications ([place]) - Primary   Relevant Orders   Bayer DCA Hb A1c Waived   CMP14+EGFR   Lipid panel    Other Visit Diagnoses    Need for hepatitis C screening test       Relevant Orders   Hepatitis C antibody   Screening for HIV without presence of risk factors       Relevant Orders   HIV antibody   Intrinsic eczema       Recommend using over-the-counter cortisone cream       Follow up plan: Return in about 3 months (around 03/17/2018), or if symptoms worsen or fail to improve, for Recheck diabetes.  Counseling provided for all of the vaccine components Orders Placed This Encounter  Procedures  . Bayer DCA Hb A1c Waived  . CMP14+EGFR  . Lipid panel  . Hepatitis C antibody  . HIV antibody    Caryl Pina, MD [facility] 12/17/2017, 10:22 AM

## 2017-12-18 LAB — CMP14+EGFR
A/G RATIO: 1.8 (ref 1.2–2.2)
ALBUMIN: 4.4 g/dL (ref 3.6–4.8)
ALK PHOS: 61 IU/L (ref 39–117)
ALT: 15 IU/L (ref 0–44)
AST: 11 IU/L (ref 0–40)
BILIRUBIN TOTAL: 0.4 mg/dL (ref 0.0–1.2)
BUN / CREAT RATIO: 12 (ref 10–24)
BUN: 11 mg/dL (ref 8–27)
CHLORIDE: 100 mmol/L (ref 96–106)
CO2: 22 mmol/L (ref 20–29)
CREATININE: 0.89 mg/dL (ref 0.76–1.27)
Calcium: 9.3 mg/dL (ref 8.6–10.2)
GFR calc Af Amer: 107 mL/min/{1.73_m2} (ref >59)
GFR calc non Af Amer: 93 mL/min/{1.73_m2} (ref >59)
GLOBULIN, TOTAL: 2.5 g/dL (ref 1.5–4.5)
Glucose: 124 mg/dL — ABNORMAL HIGH (ref 65–99)
POTASSIUM: 4 mmol/L (ref 3.5–5.2)
SODIUM: 138 mmol/L (ref 134–144)
Total Protein: 6.9 g/dL (ref 6.0–8.5)

## 2017-12-18 LAB — LIPID PANEL
CHOLESTEROL TOTAL: 181 mg/dL (ref 100–199)
Chol/HDL Ratio: 3.7 ratio (ref 0.0–5.0)
HDL: 49 mg/dL (ref >39)
LDL CALC: 84 mg/dL (ref 0–99)
TRIGLYCERIDES: 239 mg/dL — AB (ref 0–149)
VLDL Cholesterol Cal: 48 mg/dL — ABNORMAL HIGH (ref 5–40)

## 2017-12-18 LAB — HIV ANTIBODY (ROUTINE TESTING W REFLEX): HIV Screen 4th Generation wRfx: NONREACTIVE

## 2017-12-18 LAB — HEPATITIS C ANTIBODY

## 2018-01-26 ENCOUNTER — Telehealth: Payer: Self-pay | Admitting: *Deleted

## 2018-01-26 MED ORDER — ACCU-CHEK AVIVA PLUS W/DEVICE KIT: PACK | 0 refills | Status: DC

## 2018-01-26 MED ORDER — ACCU-CHEK SOFT TOUCH LANCETS MISC: 3 refills | Status: DC

## 2018-01-26 MED ORDER — GLUCOSE BLOOD VI STRP: ORAL_STRIP | 3 refills | Status: DC

## 2018-01-26 NOTE — Telephone Encounter (Signed)
Pt aware new glucose monitor & supplies sent to [facility]

## 2018-02-08 ENCOUNTER — Other Ambulatory Visit: Payer: Self-pay

## 2018-02-08 MED ORDER — BLOOD GLUCOSE MONITOR SYSTEM W/DEVICE KIT: 1.0000 | PACK | Freq: Three times a day (TID) | 11 refills | Status: DC

## 2018-02-08 MED ORDER — BLOOD GLUCOSE METER KIT: PACK | 11 refills | Status: DC

## 2018-02-15 ENCOUNTER — Other Ambulatory Visit: Payer: Self-pay | Admitting: *Deleted

## 2018-02-15 MED ORDER — ACCU-CHEK FASTCLIX LANCET KIT: PACK | 0 refills | Status: DC

## 2018-02-15 MED ORDER — ACCU-CHEK FASTCLIX LANCETS MISC: 2 refills | Status: DC

## 2018-02-19 ENCOUNTER — Other Ambulatory Visit: Payer: Self-pay | Admitting: Family Medicine

## 2018-02-19 DIAGNOSIS — B351 Tinea unguium: Secondary | ICD-10-CM

## 2018-03-08 ENCOUNTER — Ambulatory Visit: Payer: 59 | Admitting: Emergency Medicine

## 2018-03-12 ENCOUNTER — Other Ambulatory Visit: Payer: Self-pay | Admitting: Family Medicine

## 2018-03-16 ENCOUNTER — Other Ambulatory Visit: Payer: Self-pay | Admitting: Family Medicine

## 2018-03-18 ENCOUNTER — Ambulatory Visit (INDEPENDENT_AMBULATORY_CARE_PROVIDER_SITE_OTHER): Payer: 59 | Admitting: Family Medicine

## 2018-03-18 ENCOUNTER — Encounter: Payer: Self-pay | Admitting: Family Medicine

## 2018-03-18 VITALS — BP 130/87 | HR 76 | Temp 97.8°F | Ht 73.0 in | Wt 239.0 lb

## 2018-03-18 DIAGNOSIS — E1159 Type 2 diabetes mellitus with other circulatory complications: Secondary | ICD-10-CM

## 2018-03-18 DIAGNOSIS — E1169 Type 2 diabetes mellitus with other specified complication: Secondary | ICD-10-CM

## 2018-03-18 DIAGNOSIS — I10 Essential (primary) hypertension: Secondary | ICD-10-CM | POA: Diagnosis not present

## 2018-03-18 DIAGNOSIS — J449 Chronic obstructive pulmonary disease, unspecified: Secondary | ICD-10-CM | POA: Diagnosis not present

## 2018-03-18 DIAGNOSIS — K219 Gastro-esophageal reflux disease without esophagitis: Secondary | ICD-10-CM | POA: Diagnosis not present

## 2018-03-18 LAB — BAYER DCA HB A1C WAIVED: HB A1C (BAYER DCA - WAIVED): 5.8 % (ref <7.0)

## 2018-03-18 MED ORDER — PRAVASTATIN SODIUM 40 MG PO TABS: 40.0000 mg | ORAL_TABLET | Freq: Every day | ORAL | 3 refills | Status: DC

## 2018-03-18 NOTE — Progress Notes (Signed)
BP 130/87   Pulse 76   Temp 97.8 F (36.6 C) (Oral)   Ht _0  (1.854 m)   Wt 239 lb (108.4 kg)   BMI 31.53 kg/m    Subjective:    Patient ID: Craig Arnold, male    DOB: [birth year], 62 y.o.   MRN: [id number]  HPI: Craig Arnold is a 62 y.o. male presenting on 03/18/2018 for Diabetes (3 mo); Hyperlipidemia; and Hypertension   HPI Type 2 diabetes mellitus Patient comes in today for recheck of his diabetes. Patient has been currently taking metformin and his last A1c was 6.4. Patient is currently on an ACE inhibitor/ARB. Patient has not seen an ophthalmologist this year. Patient denies any new issues with their feet and his onychomycosis is clearing up nicely and is about half grown out now.   Hypertension Patient is currently on lisinopril, and their blood pressure today is 130/87. Patient denies any lightheadedness or dizziness. Patient denies headaches, blurred vision, chest pains, shortness of breath, or weakness. Denies any side effects from medication and is content with current medication.   Hyperlipidemia Patient is coming in for recheck of his hyperlipidemia. The patient is currently taking no medication but will start on pravastatin for cardiac prevention. They deny any issues with myalgias or history of liver damage from it. They deny any focal numbness or weakness or chest pain.   COPD Patient is coming in for COPD recheck today.  He is currently on DuoNeb and bowel rest and Symbicort.  He has a mild chronic cough but denies any major coughing spells or wheezing spells.  He has 1nighttime symptoms per week and 1daytime symptoms per week currently.   GERD Patient is currently on no medication and denies any issues today.  She denies any major symptoms or abdominal pain or belching or burping. She denies any blood in her stool or lightheadedness or dizziness.   Relevant past medical, surgical, family and social history reviewed and updated as indicated. Interim medical history  since our last visit reviewed. Allergies and medications reviewed and updated.  Review of Systems  Constitutional: Negative for chills and fever.  Eyes: Negative for discharge.  Respiratory: Negative for shortness of breath and wheezing.   Cardiovascular: Negative for chest pain and leg swelling.  Musculoskeletal: Negative for back pain and gait problem.  Skin: Negative for rash.  Neurological: Negative for dizziness, weakness and light-headedness.  All other systems reviewed and are negative.   Per HPI unless specifically indicated above   Allergies as of 03/18/2018   No Known Allergies     Medication List        Accurate as of 03/18/18  9:42 AM. Always use your most recent med list.          ACCU-CHEK FASTCLIX LANCET Kit Use to check blood sugars TID   ACCU-CHEK FASTCLIX LANCETS Misc Use to check blood sugars TID   albuterol 108 (90 Base) MCG/ACT inhaler Commonly known as:  PROAIR HFA Inhale 2 puffs into the lungs every 4 (four) hours as needed.   Blood Glucose Monitor System w/Device Kit 1 Device by Does not apply route 3 (three) times daily.   DALIRESP 500 MCG Tabs tablet Generic drug:  roflumilast TAKE 1 TABLET (500 MCG TOTAL) BY MOUTH DAILY.   ipratropium-albuterol 0.5-2.5 (3) MG/3ML Soln Commonly known as:  DUONEB Take 3 mLs by nebulization every 6 (six) hours as needed.   lisinopril 20 MG tablet Commonly known as:  PRINIVIL,ZESTRIL TAKE 1 TABLET BY  MOUTH EVERY DAY   loratadine 10 MG tablet Commonly known as:  CLARITIN Take 10 mg by mouth daily.   metFORMIN 1000 MG tablet Commonly known as:  GLUCOPHAGE Take 1 tablet (1,000 mg total) by mouth 2 (two) times daily with a meal.   pravastatin 40 MG tablet Commonly known as:  PRAVACHOL Take 1 tablet (40 mg total) by mouth daily.   SYMBICORT 160-4.5 MCG/ACT inhaler Generic drug:  budesonide-formoterol INHALE 2 PUFFS INTO THE LUNGS 2 (TWO) TIMES DAILY.   terbinafine 250 MG tablet Commonly known as:   LAMISIL TAKE 1 TABLET BY MOUTH EVERY DAY          Objective:    BP 130/87   Pulse 76   Temp 97.8 F (36.6 C) (Oral)   Ht _0  (1.854 m)   Wt 239 lb (108.4 kg)   BMI 31.53 kg/m   Wt Readings from Last 3 Encounters:  03/18/18 239 lb (108.4 kg)  12/17/17 242 lb (109.8 kg)  09/15/17 235 lb (106.6 kg)    Physical Exam  Constitutional: He is oriented to person, place, and time. He appears well-developed and well-nourished. No distress.  Eyes: Conjunctivae are normal. No scleral icterus.  Neck: Neck supple. No thyromegaly present.  Cardiovascular: Normal rate, regular rhythm, normal heart sounds and intact distal pulses.  No murmur heard. Pulmonary/Chest: Effort normal and breath sounds normal. No respiratory distress. He has no wheezes.  Musculoskeletal: Normal range of motion. He exhibits no edema.  Lymphadenopathy:    He has no cervical adenopathy.  Neurological: He is alert and oriented to person, place, and time. Coordination normal.  Skin: Skin is warm and dry. No rash noted. He is not diaphoretic.  Good clean toenail has grown out slightly more than halfway on both great toes  Psychiatric: He has a normal mood and affect. His behavior is normal.  Nursing note and vitals reviewed.   Results for orders placed or performed in visit on 12/17/17  Bayer DCA Hb A1c Waived  Result Value Ref Range   Bayer DCA Hb A1c Waived 6.4 <7.0 %  CMP14+EGFR  Result Value Ref Range   Glucose 124 (H) 65 - 99 mg/dL   BUN 11 8 - 27 mg/dL   Creatinine, Ser 0.89 0.76 - 1.27 mg/dL   GFR calc non Af Amer 93 >59 mL/min/1.73   GFR calc Af Amer 107 >59 mL/min/1.73   BUN/Creatinine Ratio 12 10 - 24   Sodium 138 134 - 144 mmol/L   Potassium 4.0 3.5 - 5.2 mmol/L   Chloride 100 96 - 106 mmol/L   CO2 22 20 - 29 mmol/L   Calcium 9.3 8.6 - 10.2 mg/dL   Total Protein 6.9 6.0 - 8.5 g/dL   Albumin 4.4 3.6 - 4.8 g/dL   Globulin, Total 2.5 1.5 - 4.5 g/dL   Albumin/Globulin Ratio 1.8 1.2 - 2.2    Bilirubin Total 0.4 0.0 - 1.2 mg/dL   Alkaline Phosphatase 61 39 - 117 IU/L   AST 11 0 - 40 IU/L   ALT 15 0 - 44 IU/L  Lipid panel  Result Value Ref Range   Cholesterol, Total 181 100 - 199 mg/dL   Triglycerides 239 (H) 0 - 149 mg/dL   HDL 49 >39 mg/dL   VLDL Cholesterol Cal 48 (H) 5 - 40 mg/dL   LDL Calculated 84 0 - 99 mg/dL   Chol/HDL Ratio 3.7 0.0 - 5.0 ratio  Hepatitis C antibody  Result Value Ref Range  Hep C Virus Ab <0.1 0.0 - 0.9 s/co ratio  HIV antibody  Result Value Ref Range   HIV Screen 4th Generation wRfx Non Reactive Non Reactive      Assessment & Plan:   Problem List Items Addressed This Visit      Cardiovascular and Mediastinum   Hypertension associated with diabetes ([place])   Relevant Medications   pravastatin (PRAVACHOL) 40 MG tablet   Other Relevant Orders   Microalbumin / creatinine urine ratio     Respiratory   COPD (chronic obstructive pulmonary disease) (HCC)     Digestive   GERD     Endocrine   Type 2 diabetes mellitus with other specified complication ([place]) - Primary   Relevant Medications   pravastatin (PRAVACHOL) 40 MG tablet   Other Relevant Orders   Bayer DCA Hb A1c Waived   Ambulatory referral to Optometry   Microalbumin / creatinine urine ratio       Follow up plan: Return in about 3 months (around 06/18/2018), or if symptoms worsen or fail to improve, for Diabetes and hypertension recheck.  Counseling provided for all of the vaccine components Orders Placed This Encounter  Procedures  . Bayer DCA Hb A1c Waived  . Ambulatory referral to [place], MD [facility] 03/18/2018, 9:42 AM

## 2018-03-19 LAB — MICROALBUMIN / CREATININE URINE RATIO
Creatinine, Urine: 133.4 mg/dL
Microalb/Creat Ratio: 10.3 mg/g creat (ref 0.0–30.0)
Microalbumin, Urine: 13.8 ug/mL

## 2018-03-24 ENCOUNTER — Ambulatory Visit (INDEPENDENT_AMBULATORY_CARE_PROVIDER_SITE_OTHER): Payer: 59 | Admitting: Emergency Medicine

## 2018-03-24 ENCOUNTER — Encounter: Payer: Self-pay | Admitting: Emergency Medicine

## 2018-03-24 ENCOUNTER — Ambulatory Visit (INDEPENDENT_AMBULATORY_CARE_PROVIDER_SITE_OTHER)
Admission: RE | Admit: 2018-03-24 | Discharge: 2018-03-24 | Disposition: A | Discharge status: Home / Self Care | Payer: 59 | Source: Ambulatory Visit | Attending: Emergency Medicine | Admitting: Emergency Medicine

## 2018-03-24 DIAGNOSIS — J301 Allergic rhinitis due to pollen: Secondary | ICD-10-CM | POA: Diagnosis not present

## 2018-03-24 DIAGNOSIS — J449 Chronic obstructive pulmonary disease, unspecified: Secondary | ICD-10-CM | POA: Diagnosis not present

## 2018-03-24 DIAGNOSIS — R05 Cough: Secondary | ICD-10-CM

## 2018-03-24 DIAGNOSIS — A319 Mycobacterial infection, unspecified: Secondary | ICD-10-CM

## 2018-03-24 DIAGNOSIS — R059 Cough, unspecified: Secondary | ICD-10-CM

## 2018-03-24 DIAGNOSIS — J479 Bronchiectasis, uncomplicated: Secondary | ICD-10-CM | POA: Diagnosis not present

## 2018-03-24 MED ORDER — ALBUTEROL SULFATE HFA 108 (90 BASE) MCG/ACT IN AERS: 2.0000 | INHALATION_SPRAY | RESPIRATORY_TRACT | 1 refills | Status: DC | PRN

## 2018-03-24 MED ORDER — BUDESONIDE-FORMOTEROL FUMARATE 160-4.5 MCG/ACT IN AERO: 2.0000 | INHALATION_SPRAY | Freq: Two times a day (BID) | RESPIRATORY_TRACT | 0 refills | Status: DC

## 2018-03-24 NOTE — Assessment & Plan Note (Signed)
  Keep your over-the-counter allergy medication available to use up to every 6 hours if needed for congestion, drainage, coughing. Please speak with Dr Warrick Parisian about changing your lisinopril to an alternative that is less likely to contribute to chronic dry cough.

## 2018-03-24 NOTE — Assessment & Plan Note (Signed)
We will perform a chest x-ray today to evaluate your bronchiectasis and history of mycobacterial infection

## 2018-03-24 NOTE — Assessment & Plan Note (Signed)
Uses over-the-counter chlorpheniramine

## 2018-03-24 NOTE — Progress Notes (Signed)
[name] is a 62 year old man with COPD, chronic cough with chronic postnasal drip, and bronchodilator responsiveness.  Also has documented [facility], completed  therapy (3/09 - 10/09). Follow up sputum negative. Repeat 12/09 showed AFB negative, fungal negative normal flora. Have considered him for Xolair but IgE and eosinophils low in the past.   CT scan of the chest 10/09 showed improvement in nodular infiltrates. Then 12/09 and 08/2010 repeat CTs showed stable mild bronchiectasis and emphysematous changes.  Repeat sputum 12/10 showed AFB negative, fungal negative, normal flora   ROV 01/15/17 -- this follow-up visit for moderate persistent asthma and associated COPD, chronic prednisone use. He also has a history of treated mycobacterial disease (2009). Most recent sputa have been negative. He is managed on Symbicort, pred 10mg , daliresp, loratadine. Uses mucinex. No acute flares. He does have some daily wheeze, worst in the cold weather. He uses SABA about 1x a day. His exercise tolerance is stable. He is able to do some housework, work outside. He has gained some weight since last visit. He does not want the flu shot.   ROV 09/06/17 -- she has a history of moderate persistent asthma with COPD, bronchiectasis. He is on chronic prednisone. We've also treated him in the past for mycobacterial disease. He has HTN, was started on lisinopril since our last visit. He reports that he has been doing fairly well. He had a recent worsening of his allergy sx. He took OTC decongestants. Occasional residual cough. Little change in his intermittent wheeze - responds to his albuterol. Uses SABA 1-2x a day. Using loratadine prn. He is on pred 10mg  daily.   ROV 03/24/18 --[name] is 66 and follows today for his history of moderate to severe persistent asthma with associated COPD and bronchiectasis.  He has been treated in the past for mycobacterial disease, negative on most recent sputum.  We have not imaged his chest for  many years, last CT scan was 09/01/2010.  He has required daily prednisone for symptom control.  He is on Symbicort, uses albuterol approximately. He remains active, walks his dog, works in the yard. He can sometimes get SOB, wheeze. He has daily cough, feels a globus sensation (lisinopril is new in the last year). He uses chlorpheniramine as needed. No flares, no extra pred or abx.     EXAM:  Vitals:   03/24/18 0921  BP: (!) 166/74  Pulse: 99  SpO2: 100%  Weight: 240 lb (108.9 kg)  Height: 6' (1.829 m)    Gen: [place], obese, in no distress,  normal affect, stutters  ENT: No lesions,  mouth clear,  oropharynx clear, no postnasal drip  Neck: No JVD, no Stridor  Lungs: Mostly clear.  Soft bilateral basilar expiratory wheeze  Cardiovascular: RRR, heart sounds normal, no murmur or gallops, no peripheral edema  Musculoskeletal: No deformities, no cyanosis or clubbing  Neuro: alert, non focal  Skin: Warm, no lesions or rashes   COPD (chronic obstructive pulmonary disease) (HCC) Please continue Symbicort 2 puffs twice a day.  Remember to rinse and gargle after using. Keep your albuterol available to use 2 puffs up to every 4 hours if needed for shortness of breath, coughing, chest tightness. Continue prednisone 10 mg daily.  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 Please speak with Dr  Dettinger about changing your lisinopril to an alternative that is less likely to contribute to chronic dry cough.  ALLERGIC RHINITIS DUE TO POLLEN Uses over-the-counter chlorpheniramine  Baltazar Apo, MD, PhD 03/24/2018, 9:48 AM [facility] [phone] or if no answer [phone]

## 2018-03-24 NOTE — Addendum Note (Signed)
Addended by: Desmond Dike C on: 03/24/2018 09:50 AM   Modules accepted: Orders

## 2018-03-24 NOTE — Assessment & Plan Note (Signed)
Please continue Symbicort 2 puffs twice a day.  Remember to rinse and gargle after using. Keep your albuterol available to use 2 puffs up to every 4 hours if needed for shortness of breath, coughing, chest tightness. Continue prednisone 10 mg daily.

## 2018-03-24 NOTE — Patient Instructions (Addendum)
We will perform a chest x-ray today to evaluate your bronchiectasis and history of mycobacterial infection Please continue Symbicort 2 puffs twice a day.  Remember to rinse and gargle after using. Keep your albuterol available to use 2 puffs up to every 4 hours if needed for shortness of breath, coughing, chest tightness. Continue prednisone 10 mg daily. Keep your over-the-counter allergy medication available to use up to every 6 hours if needed for congestion, drainage, coughing. Please speak with Dr Warrick Parisian about changing your lisinopril to an alternative that is less likely to contribute to chronic dry cough. Follow with Dr Lamonte Sakai in 6 months or sooner if you have any problems

## 2018-03-30 LAB — HM DIABETES EYE EXAM

## 2018-04-08 ENCOUNTER — Other Ambulatory Visit: Payer: Self-pay | Admitting: Emergency Medicine

## 2018-04-20 ENCOUNTER — Other Ambulatory Visit: Payer: Self-pay | Admitting: Family Medicine

## 2018-05-24 ENCOUNTER — Other Ambulatory Visit: Payer: Self-pay | Admitting: Family Medicine

## 2018-05-24 DIAGNOSIS — B351 Tinea unguium: Secondary | ICD-10-CM

## 2018-05-29 ENCOUNTER — Other Ambulatory Visit: Payer: Self-pay | Admitting: Emergency Medicine

## 2018-06-14 ENCOUNTER — Other Ambulatory Visit: Payer: Self-pay | Admitting: Family Medicine

## 2018-06-20 ENCOUNTER — Encounter: Payer: Self-pay | Admitting: Family Medicine

## 2018-06-20 ENCOUNTER — Ambulatory Visit (INDEPENDENT_AMBULATORY_CARE_PROVIDER_SITE_OTHER): Payer: 59 | Admitting: Family Medicine

## 2018-06-20 VITALS — BP 139/76 | HR 76 | Temp 97.5°F | Ht 72.0 in | Wt 231.0 lb

## 2018-06-20 DIAGNOSIS — E1159 Type 2 diabetes mellitus with other circulatory complications: Secondary | ICD-10-CM | POA: Diagnosis not present

## 2018-06-20 DIAGNOSIS — J449 Chronic obstructive pulmonary disease, unspecified: Secondary | ICD-10-CM | POA: Diagnosis not present

## 2018-06-20 DIAGNOSIS — K219 Gastro-esophageal reflux disease without esophagitis: Secondary | ICD-10-CM | POA: Diagnosis not present

## 2018-06-20 DIAGNOSIS — E1169 Type 2 diabetes mellitus with other specified complication: Secondary | ICD-10-CM

## 2018-06-20 DIAGNOSIS — I10 Essential (primary) hypertension: Secondary | ICD-10-CM

## 2018-06-20 DIAGNOSIS — Z1211 Encounter for screening for malignant neoplasm of colon: Secondary | ICD-10-CM

## 2018-06-20 DIAGNOSIS — E669 Obesity, unspecified: Secondary | ICD-10-CM

## 2018-06-20 DIAGNOSIS — R0989 Other specified symptoms and signs involving the circulatory and respiratory systems: Secondary | ICD-10-CM

## 2018-06-20 DIAGNOSIS — F458 Other somatoform disorders: Secondary | ICD-10-CM

## 2018-06-20 LAB — BAYER DCA HB A1C WAIVED: HB A1C: 5.7 % (ref <7.0)

## 2018-06-20 MED ORDER — OLMESARTAN MEDOXOMIL 20 MG PO TABS: 20.0000 mg | ORAL_TABLET | Freq: Every day | ORAL | 1 refills | Status: DC

## 2018-06-20 NOTE — Progress Notes (Addendum)
BP 139/76   Pulse 76   Temp (!) 97.5 F (36.4 C) (Oral)   Ht 6' (1.829 m)   Wt 231 lb (104.8 kg)   BMI 31.33 kg/m    Subjective:    Patient ID: Craig Arnold, male    DOB: 08-Jul-1956, 62 y.o.   MRN: [id number]  HPI: Craig Arnold is a 62 y.o. male presenting on 06/20/2018 for Diabetes; Hypertension (thinks Lisinopril may be causing cough); and Hyperlipidemia   HPI Type 2 diabetes mellitus Patient comes in today for recheck of his diabetes. Patient has been currently taking metformin. Patient is currently on an ACE inhibitor/ARB. Patient has seen an ophthalmologist this year. Patient denies any issues with their feet.   Hypertension Patient is currently on lisinopril, does complain of a cough and would like to consider switching, and their blood pressure today is 139/76. Patient denies any lightheadedness or dizziness. Patient denies headaches, blurred vision, chest pains, shortness of breath, or weakness. Denies any side effects from medication and is content with current medication.   COPD Patient is coming in for COPD recheck today.  He is currently on Symbicort and DuoNeb.  He has a mild chronic cough but denies any major coughing spells or wheezing spells.  He has 1nighttime symptoms per week and 2daytime symptoms per week currently.   GERD Patient is currently on no medication.  She denies any major symptoms or abdominal pain or belching or burping. She denies any blood in her stool or lightheadedness or dizziness.  Patient does complain of food getting stuck sometimes in the back of his throat where he will have to come to cough it back up so it can pass down more easily.  He says is been going on for the past few months.  He is not currently on anything for acid reflux, he is also on an ACE inhibitor so we will consider switching this and then consider acid reflux medication if does not work.  Relevant past medical, surgical, family and social history reviewed and updated as  indicated. Interim medical history since our last visit reviewed. Allergies and medications reviewed and updated.  Review of Systems  Constitutional: Negative for chills and fever.  HENT: Positive for trouble swallowing. Negative for congestion.   Eyes: Negative for discharge and visual disturbance.  Respiratory: Positive for cough. Negative for shortness of breath and wheezing.   Cardiovascular: Negative for chest pain and leg swelling.  Musculoskeletal: Negative for back pain and gait problem.  Skin: Negative for rash.  All other systems reviewed and are negative.   Per HPI unless specifically indicated above   Allergies as of 06/20/2018   No Known Allergies     Medication List        Accurate as of 06/20/18  8:39 AM. Always use your most recent med list.          ACCU-CHEK FASTCLIX LANCET Kit Use to check blood sugars TID   ACCU-CHEK FASTCLIX LANCETS Misc Use to check blood sugars TID   albuterol 108 (90 Base) MCG/ACT inhaler Commonly known as:  PROAIR HFA Inhale 2 puffs into the lungs every 4 (four) hours as needed.   Blood Glucose Monitor System w/Device Kit 1 Device by Does not apply route 3 (three) times daily.   budesonide-formoterol 160-4.5 MCG/ACT inhaler Commonly known as:  SYMBICORT Inhale 2 puffs into the lungs 2 (two) times daily.   DALIRESP 500 MCG Tabs tablet Generic drug:  roflumilast TAKE 1 TABLET (500 MCG TOTAL)  BY MOUTH DAILY.   ipratropium-albuterol 0.5-2.5 (3) MG/3ML Soln Commonly known as:  DUONEB Take 3 mLs by nebulization every 6 (six) hours as needed.   loratadine 10 MG tablet Commonly known as:  CLARITIN Take 10 mg by mouth daily as needed.   metFORMIN 1000 MG tablet Commonly known as:  GLUCOPHAGE TAKE 1 TABLET (1,000 MG TOTAL) BY MOUTH 2 (TWO) TIMES DAILY WITH A MEAL.   olmesartan 20 MG tablet Commonly known as:  BENICAR Take 1 tablet (20 mg total) by mouth daily.   pravastatin 40 MG tablet Commonly known as:  PRAVACHOL Take  1 tablet (40 mg total) by mouth daily.   predniSONE 10 MG tablet Commonly known as:  DELTASONE TAKE 1 TABLET (10 MG TOTAL) BY MOUTH DAILY WITH BREAKFAST.   terbinafine 250 MG tablet Commonly known as:  LAMISIL TAKE 1 TABLET BY MOUTH EVERY DAY          Objective:    BP 139/76   Pulse 76   Temp (!) 97.5 F (36.4 C) (Oral)   Ht 6' (1.829 m)   Wt 231 lb (104.8 kg)   BMI 31.33 kg/m   Wt Readings from Last 3 Encounters:  06/20/18 231 lb (104.8 kg)  03/24/18 240 lb (108.9 kg)  03/18/18 239 lb (108.4 kg)    Physical Exam  Constitutional: He is oriented to person, place, and time. He appears well-developed and well-nourished. No distress.  HENT:  Mouth/Throat: Oropharynx is clear and moist.  Eyes: Conjunctivae are normal. No scleral icterus.  Neck: Neck supple. No thyromegaly present.  Cardiovascular: Normal rate, regular rhythm, normal heart sounds and intact distal pulses.  No murmur heard. Pulmonary/Chest: Effort normal and breath sounds normal. No respiratory distress. He has no wheezes.  Abdominal: Soft. Bowel sounds are normal. He exhibits no distension. There is no tenderness. There is no guarding.  Musculoskeletal: Normal range of motion. He exhibits no edema.  Lymphadenopathy:    He has no cervical adenopathy.  Neurological: He is alert and oriented to person, place, and time. Coordination normal.  Skin: Skin is warm and dry. No rash noted. He is not diaphoretic.  Psychiatric: He has a normal mood and affect. His behavior is normal.  Nursing note and vitals reviewed.   Results for orders placed or performed in visit on 04/05/18  HM DIABETES EYE EXAM  Result Value Ref Range   HM Diabetic Eye Exam No Retinopathy No Retinopathy      Assessment & Plan:   Problem List Items Addressed This Visit      Cardiovascular and Mediastinum   Hypertension associated with diabetes ([place])   Relevant Medications   olmesartan (BENICAR) 20 MG tablet   Other Relevant Orders    CMP14+EGFR     Respiratory   COPD (chronic obstructive pulmonary disease) ([place])     Digestive   GERD   Relevant Orders   CBC with Differential/Platelet     Endocrine   Type 2 diabetes mellitus with other specified complication ([place]) - Primary   Relevant Medications   olmesartan (BENICAR) 20 MG tablet   Other Relevant Orders   Lipid panel   Bayer DCA Hb A1c Waived   Magnesium   Vitamin B12     Other   Obesity (BMI 30.0-34.9)    Other Visit Diagnoses    Globus sensation       We will try switching ACE to an arb and if does not improve we will try acid reflux medication, then consider  gastroenterology referral   Colon cancer screening       Relevant Orders   Cologuard    Chronic cough, will switch from lisinopril to an arb  Will send order for Cologuard for colon cancer screening  Follow up plan: Return in about 3 months (around 09/20/2018), or if symptoms worsen or fail to improve, for Follow-up diabetes and hypertension.  Counseling provided for all of the vaccine components Orders Placed This Encounter  Procedures  . CBC with Differential/Platelet  . CMP14+EGFR  . Lipid panel  . Bayer DCA Hb A1c Waived  . Magnesium  . Vitamin B12  . [place] Kerrie Timm, MD [facility] 06/20/2018, 8:39 AM

## 2018-06-20 NOTE — Addendum Note (Signed)
Addended by: Caryl Pina on: 06/20/2018 08:38 AM   Modules accepted: Orders

## 2018-06-20 NOTE — Patient Instructions (Signed)
Chronic cough, will switch from lisinopril to an arb  Will send order for Cologuard for colon cancer screening

## 2018-06-21 LAB — CBC WITH DIFFERENTIAL/PLATELET
BASOS: 1 %
Basophils Absolute: 0.1 10*3/uL (ref 0.0–0.2)
EOS (ABSOLUTE): 0.4 10*3/uL (ref 0.0–0.4)
Eos: 6 %
HEMATOCRIT: 40.8 % (ref 37.5–51.0)
HEMOGLOBIN: 14.4 g/dL (ref 13.0–17.7)
Immature Grans (Abs): 0 10*3/uL (ref 0.0–0.1)
Immature Granulocytes: 0 %
LYMPHS ABS: 1.9 10*3/uL (ref 0.7–3.1)
Lymphs: 26 %
MCH: 31 pg (ref 26.6–33.0)
MCHC: 35.3 g/dL (ref 31.5–35.7)
MCV: 88 fL (ref 79–97)
MONOCYTES: 11 %
Monocytes Absolute: 0.8 10*3/uL (ref 0.1–0.9)
NEUTROS ABS: 4.1 10*3/uL (ref 1.4–7.0)
Neutrophils: 56 %
Platelets: 216 10*3/uL (ref 150–450)
RBC: 4.65 x10E6/uL (ref 4.14–5.80)
RDW: 13.5 % (ref 12.3–15.4)
WBC: 7.3 10*3/uL (ref 3.4–10.8)

## 2018-06-21 LAB — MAGNESIUM: MAGNESIUM: 1.6 mg/dL (ref 1.6–2.3)

## 2018-06-21 LAB — VITAMIN B12: Vitamin B-12: 406 pg/mL (ref 232–1245)

## 2018-06-21 LAB — CMP14+EGFR
A/G RATIO: 2 (ref 1.2–2.2)
ALBUMIN: 4.4 g/dL (ref 3.6–4.8)
ALK PHOS: 58 IU/L (ref 39–117)
ALT: 12 IU/L (ref 0–44)
AST: 12 IU/L (ref 0–40)
BILIRUBIN TOTAL: 0.4 mg/dL (ref 0.0–1.2)
BUN/Creatinine Ratio: 10 (ref 10–24)
BUN: 9 mg/dL (ref 8–27)
CHLORIDE: 103 mmol/L (ref 96–106)
CO2: 22 mmol/L (ref 20–29)
Calcium: 9.1 mg/dL (ref 8.6–10.2)
Creatinine, Ser: 0.89 mg/dL (ref 0.76–1.27)
GFR calc non Af Amer: 92 mL/min/{1.73_m2} (ref >59)
GFR, EST AFRICAN AMERICAN: 107 mL/min/{1.73_m2} (ref >59)
GLOBULIN, TOTAL: 2.2 g/dL (ref 1.5–4.5)
Glucose: 121 mg/dL — ABNORMAL HIGH (ref 65–99)
Potassium: 4 mmol/L (ref 3.5–5.2)
SODIUM: 140 mmol/L (ref 134–144)
Total Protein: 6.6 g/dL (ref 6.0–8.5)

## 2018-06-21 LAB — LIPID PANEL
CHOLESTEROL TOTAL: 143 mg/dL (ref 100–199)
Chol/HDL Ratio: 3.4 ratio (ref 0.0–5.0)
HDL: 42 mg/dL (ref >39)
LDL Calculated: 68 mg/dL (ref 0–99)
Triglycerides: 164 mg/dL — ABNORMAL HIGH (ref 0–149)
VLDL Cholesterol Cal: 33 mg/dL (ref 5–40)

## 2018-07-20 ENCOUNTER — Other Ambulatory Visit: Payer: Self-pay | Admitting: Family Medicine

## 2018-07-27 ENCOUNTER — Telehealth: Payer: Self-pay | Admitting: Family Medicine

## 2018-07-27 NOTE — Telephone Encounter (Signed)
Patient aware we do not have the collorguard results. Patient is going to call them and see why we have not received them yet and will let us know.

## 2018-07-28 NOTE — Telephone Encounter (Signed)
FYI

## 2018-07-28 NOTE — Telephone Encounter (Signed)
Patient called back and stated that Cologuard sample was not a good one and they could not get a good result. They are sending patient another test to do. FYI

## 2018-08-03 DIAGNOSIS — Z1211 Encounter for screening for malignant neoplasm of colon: Secondary | ICD-10-CM | POA: Diagnosis not present

## 2018-08-09 ENCOUNTER — Telehealth: Payer: Self-pay | Admitting: Family Medicine

## 2018-08-09 NOTE — Telephone Encounter (Signed)
First kit did not contain enough stool. He received a second kit and returned it last week. Reviewed on [employer] portal and that order is still Active-in process. Advised patient that it may take a couple weeks to receive the results and that we will call him back once they come in.

## 2018-08-11 LAB — COLOGUARD: Cologuard: NEGATIVE

## 2018-08-25 ENCOUNTER — Other Ambulatory Visit: Payer: Self-pay | Admitting: Family Medicine

## 2018-08-25 DIAGNOSIS — B351 Tinea unguium: Secondary | ICD-10-CM

## 2018-08-25 NOTE — Telephone Encounter (Signed)
Please review and advise Last OV and labs 06/2018

## 2018-09-21 ENCOUNTER — Encounter: Payer: Self-pay | Admitting: Family Medicine

## 2018-09-21 ENCOUNTER — Ambulatory Visit (INDEPENDENT_AMBULATORY_CARE_PROVIDER_SITE_OTHER): Payer: 59 | Admitting: Family Medicine

## 2018-09-21 VITALS — BP 130/70 | HR 75 | Temp 97.1°F | Ht 73.0 in | Wt 228.6 lb

## 2018-09-21 DIAGNOSIS — E1169 Type 2 diabetes mellitus with other specified complication: Secondary | ICD-10-CM

## 2018-09-21 DIAGNOSIS — J449 Chronic obstructive pulmonary disease, unspecified: Secondary | ICD-10-CM | POA: Diagnosis not present

## 2018-09-21 DIAGNOSIS — E669 Obesity, unspecified: Secondary | ICD-10-CM | POA: Diagnosis not present

## 2018-09-21 DIAGNOSIS — I152 Hypertension secondary to endocrine disorders: Secondary | ICD-10-CM

## 2018-09-21 DIAGNOSIS — B351 Tinea unguium: Secondary | ICD-10-CM

## 2018-09-21 DIAGNOSIS — E1159 Type 2 diabetes mellitus with other circulatory complications: Secondary | ICD-10-CM | POA: Diagnosis not present

## 2018-09-21 DIAGNOSIS — K219 Gastro-esophageal reflux disease without esophagitis: Secondary | ICD-10-CM

## 2018-09-21 DIAGNOSIS — I10 Essential (primary) hypertension: Secondary | ICD-10-CM

## 2018-09-21 LAB — BAYER DCA HB A1C WAIVED: HB A1C (BAYER DCA - WAIVED): 5.8 % (ref <7.0)

## 2018-09-21 MED ORDER — TERBINAFINE HCL 250 MG PO TABS: 250.0000 mg | ORAL_TABLET | Freq: Every day | ORAL | 1 refills | Status: DC

## 2018-09-21 MED ORDER — OMEPRAZOLE 20 MG PO CPDR: 20.0000 mg | DELAYED_RELEASE_CAPSULE | Freq: Every day | ORAL | 3 refills | Status: DC

## 2018-09-21 MED ORDER — METFORMIN HCL 1000 MG PO TABS: 1000.0000 mg | ORAL_TABLET | Freq: Two times a day (BID) | ORAL | 3 refills | Status: DC

## 2018-09-21 NOTE — Progress Notes (Signed)
BP 130/70   Pulse 75   Temp (!) 97.1 F (36.2 C) (Oral)   Ht _0  (1.854 m)   Wt 228 lb 9.6 oz (103.7 kg)   BMI 30.16 kg/m    Subjective:    Patient ID: Craig Arnold, male    DOB: May 21, 1956, 62 y.o.   MRN: [id number]  HPI: Craig Arnold is a 62 y.o. male presenting on 09/21/2018 for Diabetes (3 month follow up) and Hypertension   HPI Type 2 diabetes mellitus Patient comes in today for recheck of his diabetes. Patient has been currently taking metformin. Patient is currently on an ACE inhibitor/ARB. Patient has not seen an ophthalmologist this year. Patient denies any issues with their feet.   Hypertension Patient is currently on olmesartan, and their blood pressure today is 130/70. Patient denies any lightheadedness or dizziness. Patient denies headaches, blurred vision, chest pains, shortness of breath, or weakness. Denies any side effects from medication and is content with current medication.   GERD Patient is currently on no medication but wants to get medication because he feels like things are getting hung her food is getting stuck in the back of his throat.  She denies any major symptoms or abdominal pain or belching or burping. She denies any blood in her stool or lightheadedness or dizziness.   COPD Patient is coming in for COPD recheck today.  He is currently on Symbicort and Daliresp.  He has a mild chronic cough but denies any major coughing spells or wheezing spells.  He has 1nighttime symptoms per week and 1daytime symptoms per week currently.   Relevant past medical, surgical, family and social history reviewed and updated as indicated. Interim medical history since our last visit reviewed. Allergies and medications reviewed and updated.  Review of Systems  Constitutional: Negative for chills and fever.  HENT: Negative for congestion, hearing loss, rhinorrhea and sinus pain.   Eyes: Negative for visual disturbance.  Respiratory: Positive for cough. Negative for  shortness of breath and wheezing.   Cardiovascular: Negative for chest pain and leg swelling.  Gastrointestinal: Negative for abdominal pain, constipation, diarrhea and vomiting.  Musculoskeletal: Negative for back pain and gait problem.  Skin: Negative for color change and rash.  Neurological: Negative for dizziness, weakness and numbness.  All other systems reviewed and are negative.   Per HPI unless specifically indicated above   Allergies as of 09/21/2018   No Known Allergies     Medication List        Accurate as of 09/21/18 10:59 AM. Always use your most recent med list.          ACCU-CHEK FASTCLIX LANCET Kit Use to check blood sugars TID   ACCU-CHEK FASTCLIX LANCETS Misc Use to check blood sugars TID   albuterol 108 (90 Base) MCG/ACT inhaler Commonly known as:  PROVENTIL HFA;VENTOLIN HFA Inhale 2 puffs into the lungs every 4 (four) hours as needed.   Blood Glucose Monitor System w/Device Kit 1 Device by Does not apply route 3 (three) times daily.   budesonide-formoterol 160-4.5 MCG/ACT inhaler Commonly known as:  SYMBICORT Inhale 2 puffs into the lungs 2 (two) times daily.   DALIRESP 500 MCG Tabs tablet Generic drug:  roflumilast TAKE 1 TABLET (500 MCG TOTAL) BY MOUTH DAILY.   ipratropium-albuterol 0.5-2.5 (3) MG/3ML Soln Commonly known as:  DUONEB Take 3 mLs by nebulization every 6 (six) hours as needed.   loratadine 10 MG tablet Commonly known as:  CLARITIN Take 10 mg by mouth  daily as needed.   metFORMIN 1000 MG tablet Commonly known as:  GLUCOPHAGE Take 1 tablet (1,000 mg total) by mouth 2 (two) times daily with a meal.   olmesartan 20 MG tablet Commonly known as:  BENICAR Take 1 tablet (20 mg total) by mouth daily.   omeprazole 20 MG capsule Commonly known as:  PRILOSEC Take 1 capsule (20 mg total) by mouth daily.   pravastatin 40 MG tablet Commonly known as:  PRAVACHOL Take 1 tablet (40 mg total) by mouth daily.   predniSONE 10 MG  tablet Commonly known as:  DELTASONE TAKE 1 TABLET (10 MG TOTAL) BY MOUTH DAILY WITH BREAKFAST.   terbinafine 250 MG tablet Commonly known as:  LAMISIL TAKE 1 TABLET BY MOUTH EVERY DAY          Objective:    BP 130/70   Pulse 75   Temp (!) 97.1 F (36.2 C) (Oral)   Ht _0  (1.854 m)   Wt 228 lb 9.6 oz (103.7 kg)   BMI 30.16 kg/m   Wt Readings from Last 3 Encounters:  09/21/18 228 lb 9.6 oz (103.7 kg)  06/20/18 231 lb (104.8 kg)  03/24/18 240 lb (108.9 kg)    Physical Exam  Constitutional: He is oriented to person, place, and time. He appears well-developed and well-nourished. No distress.  Eyes: Conjunctivae are normal. No scleral icterus.  Neck: Neck supple. No thyromegaly present.  Cardiovascular: Normal rate, regular rhythm, normal heart sounds and intact distal pulses.  No murmur heard. Pulmonary/Chest: Effort normal and breath sounds normal. No respiratory distress. He has no wheezes.  Musculoskeletal: Normal range of motion. He exhibits no edema.  Lymphadenopathy:    He has no cervical adenopathy.  Neurological: He is alert and oriented to person, place, and time. Coordination normal.  Skin: Skin is warm and dry. No rash noted. He is not diaphoretic.  Psychiatric: He has a normal mood and affect. His behavior is normal.  Nursing note and vitals reviewed.   Results for orders placed or performed in visit on 08/23/18  Cologuard  Result Value Ref Range   Cologuard Negative       Assessment & Plan:   Problem List Items Addressed This Visit      Cardiovascular and Mediastinum   Hypertension associated with diabetes ([place])   Relevant Medications   metFORMIN (GLUCOPHAGE) 1000 MG tablet     Respiratory   COPD (chronic obstructive pulmonary disease) (HCC)     Digestive   GERD   Relevant Medications   omeprazole (PRILOSEC) 20 MG capsule     Endocrine   Type 2 diabetes mellitus with other specified complication (HCC) - Primary   Relevant Medications    metFORMIN (GLUCOPHAGE) 1000 MG tablet   Other Relevant Orders   Bayer DCA Hb A1c Waived     Other   Obesity (BMI 30.0-34.9)    Other Visit Diagnoses    Onychomycosis       Relevant Medications   terbinafine (LAMISIL) 250 MG tablet     Onychomycosis is almost completely clear from toenails, will continue it.  We will check A1c today and start omeprazole for him, if omeprazole does not help with his swallowing thing in a month and we will have to go to gastroenterology for endoscopy.  Follow up plan: Return in about 3 months (around 12/22/2018), or if symptoms worsen or fail to improve, for Diabetes recheck.  Counseling provided for all of the vaccine components Orders Placed This Encounter  Procedures  . Bayer [facility] Hb A1c Waived    Caryl Pina, MD [facility] 09/21/2018, 10:59 AM

## 2018-10-12 ENCOUNTER — Other Ambulatory Visit: Payer: Self-pay | Admitting: Emergency Medicine

## 2018-11-21 ENCOUNTER — Other Ambulatory Visit: Payer: Self-pay | Admitting: Family Medicine

## 2018-11-21 MED ORDER — ACCU-CHEK FASTCLIX LANCETS MISC: 2 refills | Status: DC

## 2018-11-21 MED ORDER — GLUCOSE BLOOD VI STRP: ORAL_STRIP | 2 refills | Status: DC

## 2018-11-21 MED ORDER — ACCU-CHEK FASTCLIX LANCET KIT: PACK | 0 refills | Status: DC

## 2018-11-21 NOTE — Telephone Encounter (Signed)
rx sent to pharmacy as requested and left message on pt voicemail to call back with any questions or concerns.

## 2018-11-21 NOTE — Addendum Note (Signed)
Addended by: Antonietta Barcelona D on: 11/21/2018 08:59 AM   Modules accepted: Orders

## 2018-11-22 ENCOUNTER — Encounter: Payer: Self-pay | Admitting: Emergency Medicine

## 2018-11-22 ENCOUNTER — Ambulatory Visit (INDEPENDENT_AMBULATORY_CARE_PROVIDER_SITE_OTHER): Payer: 59 | Admitting: Emergency Medicine

## 2018-11-22 DIAGNOSIS — A319 Mycobacterial infection, unspecified: Secondary | ICD-10-CM

## 2018-11-22 DIAGNOSIS — J449 Chronic obstructive pulmonary disease, unspecified: Secondary | ICD-10-CM | POA: Diagnosis not present

## 2018-11-22 NOTE — Patient Instructions (Addendum)
Please continue your Symbicort 2 puffs twice daily as you have been taking it. Keep albuterol available to use 2 puffs if you needed for shortness of breath, wheezing, chest tightness. Keep DuoNeb available to use up to every 6 hours if you needed for shortness of breath Please continue your prednisone 10 mg daily every day. Paperwork for disability parking placard completed today You would benefit from having the flu shot.  Please let us know if you change your mind about getting this. Follow with Dr Lamonte Sakai in 6 months or sooner if you have any problems

## 2018-11-22 NOTE — Assessment & Plan Note (Signed)
Treated and now culture negative.  Most recent chest x-ray was unremarkable.  We will repeat at his next visit.

## 2018-11-22 NOTE — Progress Notes (Signed)
Craig Arnold is a 62 year old man with COPD, chronic cough with chronic postnasal drip, and bronchodilator responsiveness.  Also has documented [facility], completed  therapy (3/09 - 10/09). Follow up sputum negative. Repeat 12/09 showed AFB negative, fungal negative normal flora. Have considered him for Xolair but IgE and eosinophils low in the past.   CT scan of the chest 10/09 showed improvement in nodular infiltrates. Then 12/09 and 08/2010 repeat CTs showed stable mild bronchiectasis and emphysematous changes.  Repeat sputum 12/10 showed AFB negative, fungal negative, normal flora   ROV 01/15/17 -- this follow-up visit for moderate persistent asthma and associated COPD, chronic prednisone use. He also has a history of treated mycobacterial disease (2009). Most recent sputa have been negative. He is managed on Symbicort, pred 10mg , daliresp, loratadine. Uses mucinex. No acute flares. He does have some daily wheeze, worst in the cold weather. He uses SABA about 1x a day. His exercise tolerance is stable. He is able to do some housework, work outside. He has gained some weight since last visit. He does not want the flu shot.   ROV 09/06/17 -- she has a history of moderate persistent asthma with COPD, bronchiectasis. He is on chronic prednisone. We've also treated him in the past for mycobacterial disease. He has HTN, was started on lisinopril since our last visit. He reports that he has been doing fairly well. He had a recent worsening of his allergy sx. He took OTC decongestants. Occasional residual cough. Little change in his intermittent wheeze - responds to his albuterol. Uses SABA 1-2x a day. Using loratadine prn. He is on pred 10mg  daily.   ROV 03/24/18 --[name] is 62 and follows today for his history of moderate to severe persistent asthma with associated COPD and bronchiectasis.  He has been treated in the past for mycobacterial disease, negative on most recent sputum.  We have not imaged his chest for  many years, last CT scan was 09/01/2010.  He has required daily prednisone for symptom control.  He is on Symbicort, uses albuterol approximately. He remains active, walks his dog, works in the yard. He can sometimes get SOB, wheeze. He has daily cough, feels a globus sensation (lisinopril is new in the last year). He uses chlorpheniramine as needed. No flares, no extra pred or abx.   ROV 11/22/18 --follow-up visit 62 year old gentleman with moderate to severe persistent asthma with associated COPD and bronchiectasis.  Managed on chronic prednisone 10 mg daily.  He is been treated for [facility], negative on most recent sputum.  Chest x-ray in April was reviewed, shows clear lungs with some biapical pleural thickening consistent with scar, no overt infiltrates. He believes that his breathing has been stable. Good and bad days. He has wheeze that can happen at random. Since last time he has come off lisinopril, added omeprazole. He is on symbicort bid, uses albuterol 3-7x a week. No flares or hospitalizations since last time.     EXAM:  Vitals:   11/22/18 1159  BP: (!) 158/78  Pulse: 90  SpO2: 99%  Weight: 243 lb (110.2 kg)  Height: 6\' 1"  (1.854 m)    Gen: Pleasant, obese, in no distress,  normal affect, stutters  ENT: No lesions,  mouth clear,  oropharynx clear, no postnasal drip  Neck: No JVD, no Stridor  Lungs: Mostly clear.  Soft bilateral basilar expiratory wheeze  Cardiovascular: RRR, heart sounds normal, no murmur or gallops, no peripheral edema  Musculoskeletal: No deformities, no cyanosis or clubbing  Neuro:  alert, non focal  Skin: Warm, no lesions or rashes   COPD (chronic obstructive pulmonary disease) (HCC) Please continue your Symbicort 2 puffs twice daily as you have been taking it. Keep albuterol available to use 2 puffs if you needed for shortness of breath, wheezing, chest tightness. Keep DuoNeb available to use up to every 6 hours if you needed for shortness of  breath Please continue your prednisone 10 mg daily every day. Paperwork for disability parking placard completed today You would benefit from having the flu shot.  Please let us know if you change your mind about getting this. 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  Most recent chest x-ray was unremarkable.  We will repeat at his next visit.  Baltazar Apo, MD, PhD 11/22/2018, 12:19 PM [facility] [phone] or if no answer [phone]

## 2018-11-22 NOTE — Assessment & Plan Note (Signed)
Please continue your Symbicort 2 puffs twice daily as you have been taking it. Keep albuterol available to use 2 puffs if you needed for shortness of breath, wheezing, chest tightness. Keep DuoNeb available to use up to every 6 hours if you needed for shortness of breath Please continue your prednisone 10 mg daily every day. Paperwork for disability parking placard completed today You would benefit from having the flu shot.  Please let us know if you change your mind about getting this. Follow with Dr Lamonte Sakai in 6 months or sooner if you have any problems

## 2018-12-07 ENCOUNTER — Telehealth: Payer: Self-pay | Admitting: Emergency Medicine

## 2018-12-07 MED ORDER — PREDNISONE 10 MG PO TABS: 10.0000 mg | ORAL_TABLET | Freq: Every day | ORAL | 1 refills | Status: DC

## 2018-12-07 NOTE — Telephone Encounter (Signed)
Call made to patient, requesting refill of symbicort. Confirmed pharmacy and medication dose and frequency. Refill sent in. Voiced understanding. Nothing further is needed at this time.

## 2018-12-23 ENCOUNTER — Ambulatory Visit: Payer: 59 | Admitting: Family Medicine

## 2019-01-16 ENCOUNTER — Other Ambulatory Visit: Payer: Self-pay | Admitting: Family Medicine

## 2019-01-16 DIAGNOSIS — E1159 Type 2 diabetes mellitus with other circulatory complications: Secondary | ICD-10-CM

## 2019-01-16 DIAGNOSIS — I10 Essential (primary) hypertension: Principal | ICD-10-CM

## 2019-01-25 ENCOUNTER — Ambulatory Visit: Payer: 59 | Admitting: Family Medicine

## 2019-02-01 ENCOUNTER — Ambulatory Visit: Payer: BLUE CROSS/BLUE SHIELD | Admitting: Family Medicine

## 2019-02-01 ENCOUNTER — Encounter: Payer: Self-pay | Admitting: Family Medicine

## 2019-02-01 VITALS — BP 140/80 | HR 84 | Temp 97.4°F | Ht 73.0 in | Wt 240.4 lb

## 2019-02-01 DIAGNOSIS — E1169 Type 2 diabetes mellitus with other specified complication: Secondary | ICD-10-CM | POA: Diagnosis not present

## 2019-02-01 DIAGNOSIS — E1159 Type 2 diabetes mellitus with other circulatory complications: Secondary | ICD-10-CM

## 2019-02-01 DIAGNOSIS — E669 Obesity, unspecified: Secondary | ICD-10-CM

## 2019-02-01 DIAGNOSIS — K219 Gastro-esophageal reflux disease without esophagitis: Secondary | ICD-10-CM | POA: Diagnosis not present

## 2019-02-01 DIAGNOSIS — J449 Chronic obstructive pulmonary disease, unspecified: Secondary | ICD-10-CM

## 2019-02-01 DIAGNOSIS — I10 Essential (primary) hypertension: Secondary | ICD-10-CM

## 2019-02-01 LAB — BAYER DCA HB A1C WAIVED: HB A1C (BAYER DCA - WAIVED): 5.8 % (ref <7.0)

## 2019-02-01 NOTE — Progress Notes (Signed)
BP 140/80   Pulse 84   Temp (!) 97.4 F (36.3 C) (Oral)   Ht _0  (1.854 m)   Wt 240 lb 6.4 oz (109 kg)   BMI 31.72 kg/m    Subjective:    Patient ID: Craig Arnold, male    DOB: 19-Jul-1956, 63 y.o.   MRN: [id number]  HPI: Craig Arnold is a 63 y.o. male presenting on 02/01/2019 for Diabetes (3 month follow up) and Hypertension   HPI Type 2 diabetes mellitus Patient comes in today for recheck of his diabetes. Patient has been currently taking metformin. Patient is currently on an ACE inhibitor/ARB. Patient has not seen an ophthalmologist this year. Patient denies  any issues with their feet.   Hypertension Patient is currently on olmesartan, and their blood pressure today is 140/80. Patient denies any lightheadedness or dizziness. Patient denies headaches, blurred vision, chest pains, shortness of breath, or weakness. Denies any side effects from medication and is content with current medication.   GERD Patient is currently on olmesartan.  She denies any major symptoms or abdominal pain or belching or burping. She denies any blood in her stool or lightheadedness or dizziness.   COPD Patient is coming in for COPD recheck today.  He is currently on Symbicort and Daliresp and DuoNeb.  He has a mild chronic cough but denies any major coughing spells or wheezing spells.  He has 7nighttime symptoms per week and 7daytime symptoms per week currently.  Patient does have a pulmonologist and he sees them and he is on prednisone 10 every day, recommended for him to increase to 20 for the next 5 days and then go back down to 10.  Relevant past medical, surgical, family and social history reviewed and updated as indicated. Interim medical history since our last visit reviewed. Allergies and medications reviewed and updated.  Review of Systems  Constitutional: Negative for chills and fever.  Eyes: Negative for discharge and visual disturbance.  Respiratory: Positive for wheezing. Negative for  cough and shortness of breath.   Cardiovascular: Negative for chest pain and leg swelling.  Musculoskeletal: Negative for back pain and gait problem.  Skin: Negative for rash.  Neurological: Negative for dizziness, weakness, light-headedness and numbness.  All other systems reviewed and are negative.   Per HPI unless specifically indicated above   Allergies as of 02/01/2019   No Known Allergies     Medication List       Accurate as of February 01, 2019  3:23 PM. Always use your most recent med list.        ACCU-CHEK FASTCLIX LANCET Kit Use to check blood sugars TID   ACCU-CHEK FASTCLIX LANCETS Misc Use to check blood sugars TID   albuterol 108 (90 Base) MCG/ACT inhaler Commonly known as:  PROAIR HFA Inhale 2 puffs into the lungs every 4 (four) hours as needed.   Blood Glucose Monitor System w/Device Kit 1 Device by Does not apply route 3 (three) times daily.   budesonide-formoterol 160-4.5 MCG/ACT inhaler Commonly known as:  SYMBICORT Inhale 2 puffs into the lungs 2 (two) times daily.   DALIRESP 500 MCG Tabs tablet Generic drug:  roflumilast TAKE 1 TABLET (500 MCG TOTAL) BY MOUTH DAILY.   glucose blood test strip Use to check blood sugars TID, Dx E11.9   ipratropium-albuterol 0.5-2.5 (3) MG/3ML Soln Commonly known as:  DUONEB Take 3 mLs by nebulization every 6 (six) hours as needed.   loratadine 10 MG tablet Commonly known as:  CLARITIN  Take 10 mg by mouth daily as needed.   metFORMIN 1000 MG tablet Commonly known as:  GLUCOPHAGE Take 1 tablet (1,000 mg total) by mouth 2 (two) times daily with a meal.   olmesartan 20 MG tablet Commonly known as:  BENICAR TAKE 1 TABLET BY MOUTH EVERY DAY   omeprazole 20 MG capsule Commonly known as:  PRILOSEC Take 1 capsule (20 mg total) by mouth daily.   pravastatin 40 MG tablet Commonly known as:  PRAVACHOL Take 1 tablet (40 mg total) by mouth daily.   predniSONE 10 MG tablet Commonly known as:  DELTASONE Take 1  tablet (10 mg total) by mouth daily with breakfast.   terbinafine 250 MG tablet Commonly known as:  LAMISIL Take 1 tablet (250 mg total) by mouth daily.          Objective:    BP 140/80   Pulse 84   Temp (!) 97.4 F (36.3 C) (Oral)   Ht _0  (1.854 m)   Wt 240 lb 6.4 oz (109 kg)   BMI 31.72 kg/m   Wt Readings from Last 3 Encounters:  02/01/19 240 lb 6.4 oz (109 kg)  11/22/18 243 lb (110.2 kg)  09/21/18 228 lb 9.6 oz (103.7 kg)    Physical Exam Vitals signs and nursing note reviewed.  Constitutional:      General: He is not in acute distress.    Appearance: He is well-developed. He is not diaphoretic.  Eyes:     General: No scleral icterus.    Conjunctiva/sclera: Conjunctivae normal.  Neck:     Musculoskeletal: Neck supple.     Thyroid: No thyromegaly.  Cardiovascular:     Rate and Rhythm: Normal rate and regular rhythm.     Heart sounds: Normal heart sounds. No murmur.  Pulmonary:     Effort: Pulmonary effort is normal. No respiratory distress.     Breath sounds: Wheezing (Wheezes throughout) present. No rhonchi.  Musculoskeletal: Normal range of motion.  Lymphadenopathy:     Cervical: No cervical adenopathy.  Skin:    General: Skin is warm and dry.     Findings: No rash.  Neurological:     Mental Status: He is alert and oriented to person, place, and time.     Coordination: Coordination normal.  Psychiatric:        Behavior: Behavior normal.     Results for orders placed or performed in visit on 09/21/18  Bayer DCA Hb A1c Waived  Result Value Ref Range   HB A1C (BAYER DCA - WAIVED) 5.8 <7.0 %      Assessment & Plan:   Problem List Items Addressed This Visit      Cardiovascular and Mediastinum   Hypertension associated with diabetes ([place])     Respiratory   COPD (chronic obstructive pulmonary disease) (HCC)     Digestive   GERD     Endocrine   Type 2 diabetes mellitus with other specified complication ([place]) - Primary   Relevant Orders    CBC with Differential/Platelet   CMP14+EGFR   Bayer DCA Hb A1c Waived     Other   Obesity (BMI 30.0-34.9)   Relevant Orders   Lipid panel      Will have patient double his prednisone of 10 mg for the next 5 days Follow up plan: Return in about 3 months (around 05/02/2019), or if symptoms worsen or fail to improve, for Diabetes and hypertension.  Counseling provided for all of the vaccine components Orders Placed  This Encounter  Procedures  . CBC with Differential/Platelet  . CMP14+EGFR  . Lipid panel  . Bayer [facility] Hb A1c [place], MD [facility] 02/01/2019, 3:23 PM

## 2019-02-02 LAB — CBC WITH DIFFERENTIAL/PLATELET
Basophils Absolute: 0 10*3/uL (ref 0.0–0.2)
Basos: 0 %
EOS (ABSOLUTE): 0.1 10*3/uL (ref 0.0–0.4)
Eos: 2 %
Hematocrit: 40 % (ref 37.5–51.0)
Hemoglobin: 14.3 g/dL (ref 13.0–17.7)
IMMATURE GRANS (ABS): 0 10*3/uL (ref 0.0–0.1)
IMMATURE GRANULOCYTES: 1 %
Lymphocytes Absolute: 1.2 10*3/uL (ref 0.7–3.1)
Lymphs: 18 %
MCH: 30 pg (ref 26.6–33.0)
MCHC: 35.8 g/dL — ABNORMAL HIGH (ref 31.5–35.7)
MCV: 84 fL (ref 79–97)
Monocytes Absolute: 0.5 10*3/uL (ref 0.1–0.9)
Monocytes: 7 %
NEUTROS PCT: 72 %
Neutrophils Absolute: 4.9 10*3/uL (ref 1.4–7.0)
Platelets: 225 10*3/uL (ref 150–450)
RBC: 4.77 x10E6/uL (ref 4.14–5.80)
RDW: 12.9 % (ref 11.6–15.4)
WBC: 6.8 10*3/uL (ref 3.4–10.8)

## 2019-02-02 LAB — CMP14+EGFR
ALT: 13 IU/L (ref 0–44)
AST: 13 IU/L (ref 0–40)
Albumin/Globulin Ratio: 1.8 (ref 1.2–2.2)
Albumin: 4.6 g/dL (ref 3.8–4.8)
Alkaline Phosphatase: 69 IU/L (ref 39–117)
BUN/Creatinine Ratio: 19 (ref 10–24)
BUN: 15 mg/dL (ref 8–27)
Bilirubin Total: 0.7 mg/dL (ref 0.0–1.2)
CO2: 20 mmol/L (ref 20–29)
Calcium: 9.1 mg/dL (ref 8.6–10.2)
Chloride: 97 mmol/L (ref 96–106)
Creatinine, Ser: 0.78 mg/dL (ref 0.76–1.27)
GFR calc Af Amer: 112 mL/min/{1.73_m2} (ref >59)
GFR calc non Af Amer: 97 mL/min/{1.73_m2} (ref >59)
Globulin, Total: 2.6 g/dL (ref 1.5–4.5)
Glucose: 119 mg/dL — ABNORMAL HIGH (ref 65–99)
Potassium: 4.5 mmol/L (ref 3.5–5.2)
Sodium: 135 mmol/L (ref 134–144)
TOTAL PROTEIN: 7.2 g/dL (ref 6.0–8.5)

## 2019-02-02 LAB — LIPID PANEL
Chol/HDL Ratio: 3.6 ratio (ref 0.0–5.0)
Cholesterol, Total: 149 mg/dL (ref 100–199)
HDL: 41 mg/dL (ref >39)
LDL Calculated: 71 mg/dL (ref 0–99)
Triglycerides: 187 mg/dL — ABNORMAL HIGH (ref 0–149)
VLDL Cholesterol Cal: 37 mg/dL (ref 5–40)

## 2019-03-09 ENCOUNTER — Other Ambulatory Visit: Payer: Self-pay

## 2019-03-09 ENCOUNTER — Ambulatory Visit: Payer: BLUE CROSS/BLUE SHIELD | Admitting: Family Medicine

## 2019-03-09 ENCOUNTER — Encounter: Payer: Self-pay | Admitting: Family Medicine

## 2019-03-09 VITALS — BP 168/90 | HR 106 | Temp 97.8°F | Ht 73.0 in | Wt 242.0 lb

## 2019-03-09 DIAGNOSIS — T23231A Burn of second degree of multiple right fingers (nail), not including thumb, initial encounter: Secondary | ICD-10-CM | POA: Diagnosis not present

## 2019-03-09 MED ORDER — MUPIROCIN 2 % EX OINT: 1.0000 "application " | TOPICAL_OINTMENT | Freq: Two times a day (BID) | CUTANEOUS | 2 refills | Status: DC
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
## 2019-03-17 ENCOUNTER — Other Ambulatory Visit: Payer: Self-pay | Admitting: Family Medicine

## 2019-03-17 DIAGNOSIS — E1159 Type 2 diabetes mellitus with other circulatory complications: Secondary | ICD-10-CM

## 2019-03-17 DIAGNOSIS — E1169 Type 2 diabetes mellitus with other specified complication: Secondary | ICD-10-CM

## 2019-03-17 DIAGNOSIS — I10 Essential (primary) hypertension: Secondary | ICD-10-CM

## 2019-04-10 ENCOUNTER — Other Ambulatory Visit: Payer: Self-pay | Admitting: Emergency Medicine

## 2019-04-16 ENCOUNTER — Other Ambulatory Visit: Payer: Self-pay | Admitting: Family Medicine

## 2019-04-16 DIAGNOSIS — I10 Essential (primary) hypertension: Principal | ICD-10-CM

## 2019-04-16 DIAGNOSIS — E1159 Type 2 diabetes mellitus with other circulatory complications: Secondary | ICD-10-CM

## 2019-05-19 ENCOUNTER — Other Ambulatory Visit: Payer: Self-pay | Admitting: Emergency Medicine

## 2019-07-08 ENCOUNTER — Other Ambulatory Visit: Payer: Self-pay | Admitting: Emergency Medicine

## 2019-07-13 ENCOUNTER — Other Ambulatory Visit: Payer: Self-pay | Admitting: Family Medicine

## 2019-07-13 DIAGNOSIS — I152 Hypertension secondary to endocrine disorders: Secondary | ICD-10-CM

## 2019-07-13 DIAGNOSIS — E1169 Type 2 diabetes mellitus with other specified complication: Secondary | ICD-10-CM

## 2019-07-13 DIAGNOSIS — E1159 Type 2 diabetes mellitus with other circulatory complications: Secondary | ICD-10-CM

## 2019-07-24 ENCOUNTER — Other Ambulatory Visit: Payer: Self-pay | Admitting: Family Medicine

## 2019-07-24 DIAGNOSIS — E1159 Type 2 diabetes mellitus with other circulatory complications: Secondary | ICD-10-CM

## 2019-07-24 DIAGNOSIS — I152 Hypertension secondary to endocrine disorders: Secondary | ICD-10-CM

## 2019-08-03 ENCOUNTER — Ambulatory Visit: Payer: BLUE CROSS/BLUE SHIELD | Admitting: Family Medicine

## 2019-08-11 ENCOUNTER — Other Ambulatory Visit: Payer: Self-pay | Admitting: Emergency Medicine

## 2019-09-04 ENCOUNTER — Other Ambulatory Visit: Payer: Self-pay

## 2019-09-05 ENCOUNTER — Encounter: Payer: Self-pay | Admitting: Family Medicine

## 2019-09-05 ENCOUNTER — Other Ambulatory Visit: Payer: Self-pay

## 2019-09-05 ENCOUNTER — Ambulatory Visit: Payer: BC Managed Care – PPO | Admitting: Family Medicine

## 2019-09-05 VITALS — BP 153/75 | HR 90 | Temp 99.1°F | Resp 16 | Ht 73.0 in | Wt 251.0 lb

## 2019-09-05 DIAGNOSIS — E1159 Type 2 diabetes mellitus with other circulatory complications: Secondary | ICD-10-CM

## 2019-09-05 DIAGNOSIS — I10 Essential (primary) hypertension: Secondary | ICD-10-CM

## 2019-09-05 DIAGNOSIS — K219 Gastro-esophageal reflux disease without esophagitis: Secondary | ICD-10-CM | POA: Diagnosis not present

## 2019-09-05 DIAGNOSIS — I152 Hypertension secondary to endocrine disorders: Secondary | ICD-10-CM

## 2019-09-05 DIAGNOSIS — E1169 Type 2 diabetes mellitus with other specified complication: Secondary | ICD-10-CM | POA: Diagnosis not present

## 2019-09-05 LAB — BAYER DCA HB A1C WAIVED: HB A1C (BAYER DCA - WAIVED): 6.6 % (ref <7.0)

## 2019-09-05 MED ORDER — OMEPRAZOLE 20 MG PO CPDR: 20.0000 mg | DELAYED_RELEASE_CAPSULE | Freq: Every day | ORAL | 3 refills | Status: DC

## 2019-09-05 MED ORDER — METFORMIN HCL 1000 MG PO TABS: 1000.0000 mg | ORAL_TABLET | Freq: Two times a day (BID) | ORAL | 3 refills | Status: DC

## 2019-09-05 MED ORDER — PRAVASTATIN SODIUM 40 MG PO TABS: 40.0000 mg | ORAL_TABLET | Freq: Every day | ORAL | 3 refills | Status: DC

## 2019-09-05 NOTE — Progress Notes (Signed)
BP (!) 153/75   Pulse 90   Temp 99.1 F (37.3 C) (Temporal)   Resp 16   Ht _0  (1.854 m)   Wt 251 lb (113.9 kg)   SpO2 96%   BMI 33.12 kg/m    Subjective:   Patient ID: Craig Arnold, male    DOB: 01-03-1956, 63 y.o.   MRN: [id number]  HPI: Taggart Prasad is a 63 y.o. male presenting on 09/05/2019 for Diabetes (6 month follow up)   HPI Type 2 diabetes mellitus Patient comes in today for recheck of his diabetes. Patient has been currently taking metformin. Patient is currently on an ACE inhibitor/ARB. Patient has not seen an ophthalmologist this year. Patient denies any issues with their feet.   Hypertension Patient is currently on olmesartan, and their blood pressure today is 153/75. Patient denies any lightheadedness or dizziness. Patient denies headaches, blurred vision, chest pains, shortness of breath, or weakness. Denies any side effects from medication and is content with current medication.   GERD Patient is currently on Prilosec.  She denies any major symptoms or abdominal pain or belching or burping. She denies any blood in her stool or lightheadedness or dizziness.   Relevant past medical, surgical, family and social history reviewed and updated as indicated. Interim medical history since our last visit reviewed. Allergies and medications reviewed and updated.  Review of Systems  Constitutional: Negative for chills and fever.  Respiratory: Negative for shortness of breath and wheezing.   Cardiovascular: Negative for chest pain and leg swelling.  Musculoskeletal: Negative for back pain and gait problem.  Skin: Negative for rash.  Neurological: Negative for dizziness, weakness and light-headedness.  All other systems reviewed and are negative.   Per HPI unless specifically indicated above   Allergies as of 09/05/2019   No Known Allergies     Medication List       Accurate as of September 05, 2019  9:17 AM. If you have any questions, ask your nurse or doctor.         Accu-Chek [employer] Kit Use to check blood sugars TID   Accu-Chek FastClix Lancets Misc Use to check blood sugars TID   albuterol 108 (90 Base) MCG/ACT inhaler Commonly known as: ProAir HFA Inhale 2 puffs into the lungs every 4 (four) hours as needed.   Blood Glucose Monitor System w/Device Kit 1 Device by Does not apply route 3 (three) times daily.   budesonide-formoterol 160-4.5 MCG/ACT inhaler Commonly known as: Symbicort Inhale 2 puffs into the lungs 2 (two) times daily.   Daliresp 500 MCG Tabs tablet Generic drug: roflumilast TAKE 1 TABLET BY MOUTH DAILY   glucose blood test strip Use to check blood sugars TID, Dx E11.9   ipratropium-albuterol 0.5-2.5 (3) MG/3ML Soln Commonly known as: DuoNeb Take 3 mLs by nebulization every 6 (six) hours as needed.   loratadine 10 MG tablet Commonly known as: CLARITIN Take 10 mg by mouth daily as needed.   metFORMIN 1000 MG tablet Commonly known as: GLUCOPHAGE Take 1 tablet (1,000 mg total) by mouth 2 (two) times daily with a meal.   mupirocin ointment 2 % Commonly known as: Bactroban Place 1 application into the nose 2 (two) times daily.   olmesartan 20 MG tablet Commonly known as: BENICAR TAKE 1 TABLET BY MOUTH EVERY DAY   omeprazole 20 MG capsule Commonly known as: PRILOSEC Take 1 capsule (20 mg total) by mouth daily.   pravastatin 40 MG tablet Commonly known as: PRAVACHOL Take 1  tablet (40 mg total) by mouth daily.   predniSONE 10 MG tablet Commonly known as: DELTASONE TAKE 1 TABLET (10 MG TOTAL) BY MOUTH DAILY WITH BREAKFAST.   terbinafine 250 MG tablet Commonly known as: LAMISIL Take 1 tablet (250 mg total) by mouth daily.        Objective:   BP (!) 153/75   Pulse 90   Temp 99.1 F (37.3 C) (Temporal)   Resp 16   Ht _0  (1.854 m)   Wt 251 lb (113.9 kg)   SpO2 96%   BMI 33.12 kg/m   Wt Readings from Last 3 Encounters:  09/05/19 251 lb (113.9 kg)  03/09/19 242 lb (109.8 kg)   02/01/19 240 lb 6.4 oz (109 kg)    Physical Exam Vitals signs and nursing note reviewed.  Constitutional:      General: He is not in acute distress.    Appearance: He is well-developed. He is not diaphoretic.  Eyes:     General: No scleral icterus.    Conjunctiva/sclera: Conjunctivae normal.  Neck:     Musculoskeletal: Neck supple.     Thyroid: No thyromegaly.  Cardiovascular:     Rate and Rhythm: Normal rate and regular rhythm.     Heart sounds: Normal heart sounds. No murmur.  Pulmonary:     Effort: Pulmonary effort is normal. No respiratory distress.     Breath sounds: Normal breath sounds. No wheezing.  Musculoskeletal: Normal range of motion.  Lymphadenopathy:     Cervical: No cervical adenopathy.  Skin:    General: Skin is warm and dry.     Findings: No rash.  Neurological:     Mental Status: He is alert and oriented to person, place, and time.     Coordination: Coordination normal.  Psychiatric:        Behavior: Behavior normal.       Assessment & Plan:   Problem List Items Addressed This Visit      Cardiovascular and Mediastinum   Hypertension associated with diabetes ([place])   Relevant Medications   pravastatin (PRAVACHOL) 40 MG tablet   metFORMIN (GLUCOPHAGE) 1000 MG tablet   Other Relevant Orders   CBC with Differential/Platelet   CMP14+EGFR   Lipid panel   PSA, total and free     Digestive   GERD   Relevant Medications   omeprazole (PRILOSEC) 20 MG capsule     Endocrine   Type 2 diabetes mellitus with other specified complication (HCC) - Primary   Relevant Medications   pravastatin (PRAVACHOL) 40 MG tablet   metFORMIN (GLUCOPHAGE) 1000 MG tablet   Other Relevant Orders   Bayer DCA Hb A1c Waived      No change in current medication, he appears to be doing well and his A1c is 6.6 today.  Blood pressure up slightly but likely because of mask, will continue to monitor for now, blood pressure was normal at the previous 2 regular office visits  Follow up plan: Return in about 6 months (around 03/04/2020), or if symptoms worsen or fail to improve, for Hypertension and diabetes.  Counseling provided for all of the vaccine components Orders Placed This Encounter  Procedures  . CBC with Differential/Platelet  . CMP14+EGFR  . Lipid panel  . Bayer DCA Hb A1c Waived  . PSA, total and free    Caryl Pina, MD [facility] 09/05/2019, 9:17 AM

## 2019-09-06 LAB — CBC WITH DIFFERENTIAL/PLATELET
Basophils Absolute: 0.1 10*3/uL (ref 0.0–0.2)
Basos: 1 %
EOS (ABSOLUTE): 0.4 10*3/uL (ref 0.0–0.4)
Eos: 4 %
Hematocrit: 37.6 % (ref 37.5–51.0)
Hemoglobin: 12.8 g/dL — ABNORMAL LOW (ref 13.0–17.7)
Immature Grans (Abs): 0 10*3/uL (ref 0.0–0.1)
Immature Granulocytes: 1 %
Lymphocytes Absolute: 2 10*3/uL (ref 0.7–3.1)
Lymphs: 25 %
MCH: 28.9 pg (ref 26.6–33.0)
MCHC: 34 g/dL (ref 31.5–35.7)
MCV: 85 fL (ref 79–97)
Monocytes Absolute: 0.9 10*3/uL (ref 0.1–0.9)
Monocytes: 11 %
Neutrophils Absolute: 4.8 10*3/uL (ref 1.4–7.0)
Neutrophils: 58 %
Platelets: 203 10*3/uL (ref 150–450)
RBC: 4.43 x10E6/uL (ref 4.14–5.80)
RDW: 13.7 % (ref 11.6–15.4)
WBC: 8.2 10*3/uL (ref 3.4–10.8)

## 2019-09-06 LAB — CMP14+EGFR
ALT: 14 IU/L (ref 0–44)
AST: 11 IU/L (ref 0–40)
Albumin/Globulin Ratio: 1.9 (ref 1.2–2.2)
Albumin: 4.5 g/dL (ref 3.8–4.8)
Alkaline Phosphatase: 59 IU/L (ref 39–117)
BUN/Creatinine Ratio: 12 (ref 10–24)
BUN: 10 mg/dL (ref 8–27)
Bilirubin Total: 0.5 mg/dL (ref 0.0–1.2)
CO2: 23 mmol/L (ref 20–29)
Calcium: 9.3 mg/dL (ref 8.6–10.2)
Chloride: 100 mmol/L (ref 96–106)
Creatinine, Ser: 0.85 mg/dL (ref 0.76–1.27)
GFR calc Af Amer: 108 mL/min/{1.73_m2} (ref >59)
GFR calc non Af Amer: 93 mL/min/{1.73_m2} (ref >59)
Globulin, Total: 2.4 g/dL (ref 1.5–4.5)
Glucose: 126 mg/dL — ABNORMAL HIGH (ref 65–99)
Potassium: 3.8 mmol/L (ref 3.5–5.2)
Sodium: 139 mmol/L (ref 134–144)
Total Protein: 6.9 g/dL (ref 6.0–8.5)

## 2019-09-06 LAB — PSA, TOTAL AND FREE
PSA, Free Pct: 47.5 %
PSA, Free: 0.38 ng/mL
Prostate Specific Ag, Serum: 0.8 ng/mL (ref 0.0–4.0)

## 2019-09-06 LAB — LIPID PANEL
Chol/HDL Ratio: 3.1 ratio (ref 0.0–5.0)
Cholesterol, Total: 154 mg/dL (ref 100–199)
HDL: 50 mg/dL (ref >39)
LDL Chol Calc (NIH): 74 mg/dL (ref 0–99)
Triglycerides: 179 mg/dL — ABNORMAL HIGH (ref 0–149)
VLDL Cholesterol Cal: 30 mg/dL (ref 5–40)

## 2019-10-03 ENCOUNTER — Other Ambulatory Visit: Payer: Self-pay | Admitting: Emergency Medicine

## 2019-10-18 ENCOUNTER — Other Ambulatory Visit: Payer: Self-pay | Admitting: Family Medicine

## 2019-10-18 DIAGNOSIS — E1159 Type 2 diabetes mellitus with other circulatory complications: Secondary | ICD-10-CM

## 2019-11-05 ENCOUNTER — Other Ambulatory Visit: Payer: Self-pay | Admitting: Emergency Medicine

## 2019-11-21 ENCOUNTER — Other Ambulatory Visit: Payer: Self-pay | Admitting: Emergency Medicine

## 2019-11-21 ENCOUNTER — Other Ambulatory Visit: Payer: Self-pay | Admitting: Family Medicine

## 2019-11-21 DIAGNOSIS — B351 Tinea unguium: Secondary | ICD-10-CM

## 2019-12-09 IMAGING — DX DG CHEST 2V
2 series · 2 of 2 positions shown · non-contrast
Comparison: 02/06/2011.

CLINICAL DATA: Bronchiectasis.

EXAM:
CHEST - 2 VIEW

[chest lat]
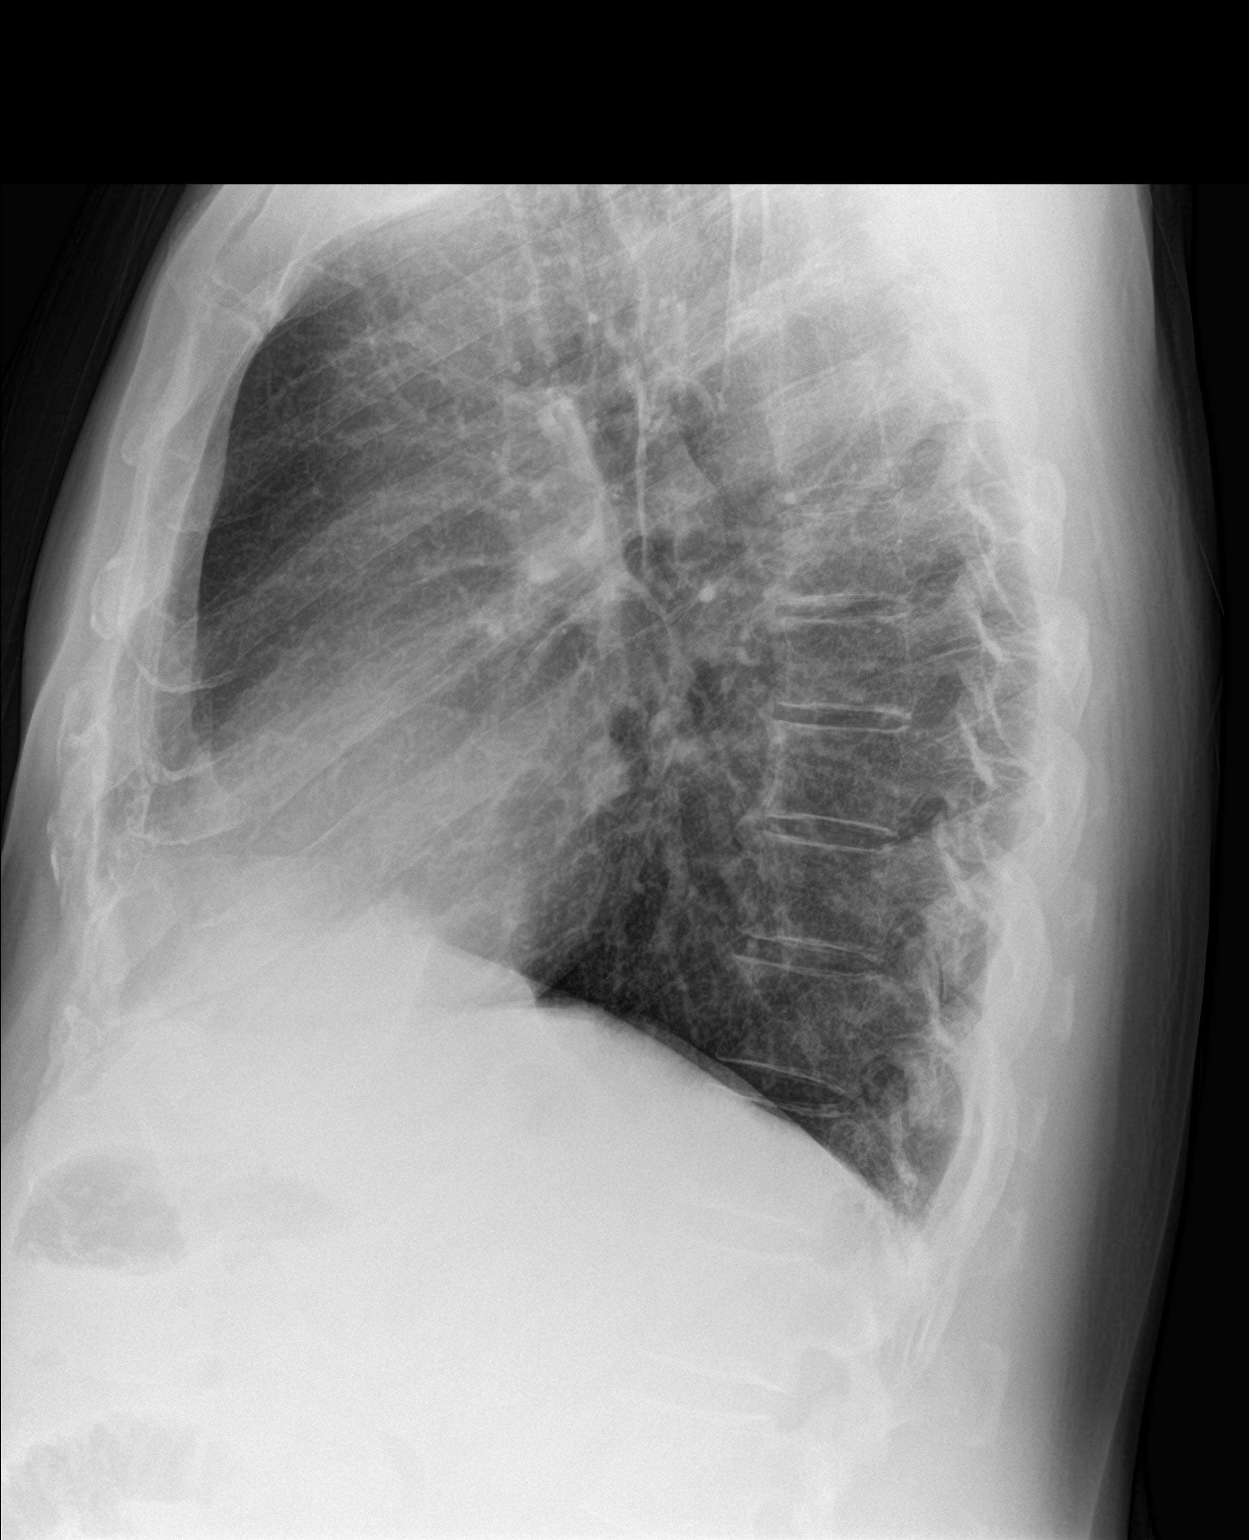

[chest pa]
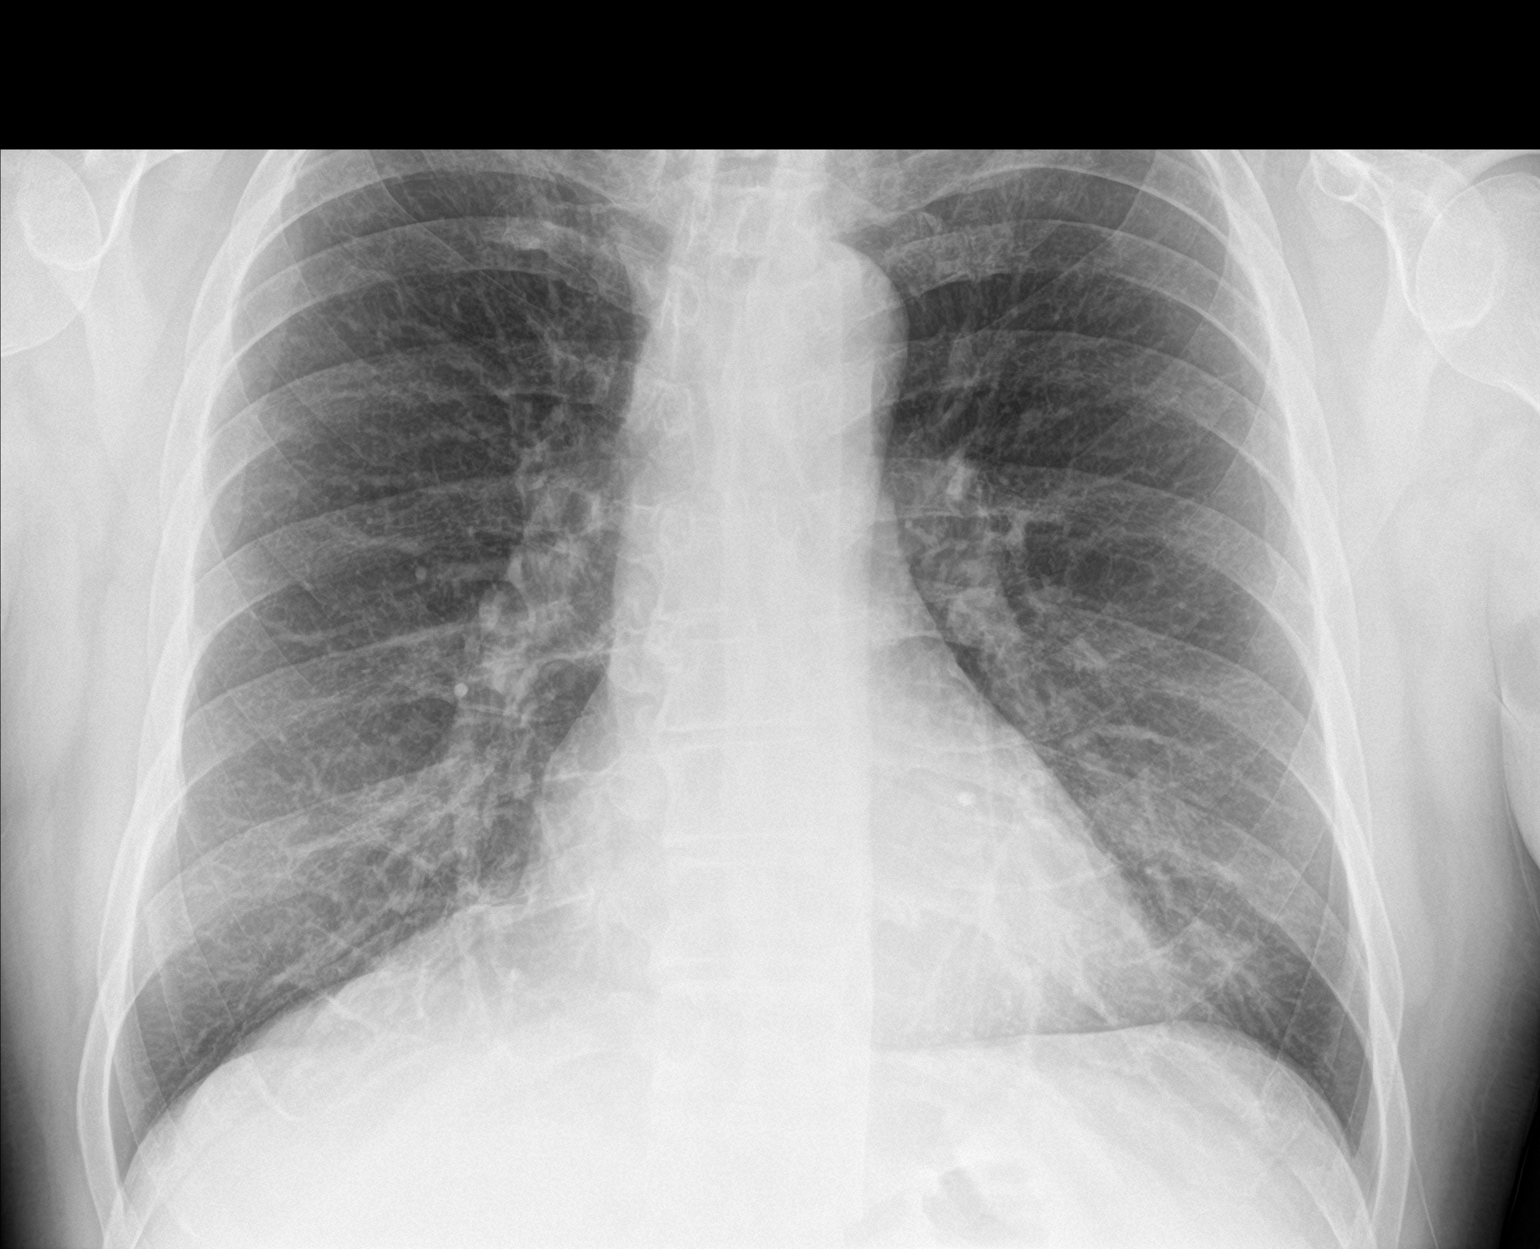

[2 of 2 positions shown; findings below may reference images not displayed]

FINDINGS: Mediastinum and hilar structures normal. Lungs are clear. No pleural
effusion or pneumothorax. Biapical pleural thickening consistent
scarring.
IMPRESSION: 1.  Cardiomegaly with normal pulmonary vascularity.

2.  No acute infiltrates.

## 2019-12-18 ENCOUNTER — Encounter: Payer: Self-pay | Admitting: Emergency Medicine

## 2019-12-18 ENCOUNTER — Ambulatory Visit (INDEPENDENT_AMBULATORY_CARE_PROVIDER_SITE_OTHER): Payer: BC Managed Care – PPO | Admitting: Emergency Medicine

## 2019-12-18 ENCOUNTER — Other Ambulatory Visit: Payer: Self-pay

## 2019-12-18 DIAGNOSIS — J449 Chronic obstructive pulmonary disease, unspecified: Secondary | ICD-10-CM | POA: Diagnosis not present

## 2019-12-18 NOTE — Progress Notes (Signed)
Virtual Visit via Telephone Note  I connected with [name] on 12/18/19 at 11:45 AM EST by telephone and verified that I am speaking with the correct person using two identifiers.  Location: Patient: Home Provider: Office   I discussed the limitations, risks, security and privacy concerns of performing an evaluation and management service by telephone and the availability of in person appointments. I also discussed with the patient that there may be a patient responsible charge related to this service. The patient expressed understanding and agreed to proceed.   History of Present Illness: 63 year old man with asthmatic COPD, severe obstruction, chronic cough with postnasal drip.  Also with a history of MAIC (treated).  He has associated bronchiectasis, subsequent cultures negative.   Observations/Objective: Currently managed on Symbicort, Daliresp, prednisone 10 mg daily.  He has loratadine, uses as needed, and is on omeprazole 20 mg daily on a schedule.  He also has DuoNeb available, does not require.  He has albuterol and uses approximately.  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 He hears wheeze occasionally, usually resolves on its own. He uses albuterol 1-3x a day. Very little cough. He does not get the flu shot.   Assessment and Plan:  Plan to follow up in 6 months, hopefully in person, can discuss timing of any repeat imaging to follow his btx.  Continue same meds - his insurance changed and the expense has increased > may need to look at his formulary and see if there are substitutions to make.  Discussed the Covid-19 shot w him, he knows that he is a good candidate for this when it is available. We will help facilitate.   Follow Up Instructions: 6 months    I discussed the assessment and treatment plan with the patient. The patient was provided an opportunity to ask questions and all were answered. The patient agreed with the plan and  demonstrated an understanding of the instructions.   The patient was advised to call back or seek an in-person evaluation if the symptoms worsen or if the condition fails to improve as anticipated.  I provided 17 minutes of non-face-to-face time during this encounter.   Collene Gobble, MD

## 2020-01-09 ENCOUNTER — Other Ambulatory Visit: Payer: Self-pay | Admitting: Emergency Medicine

## 2020-01-12 ENCOUNTER — Telehealth: Payer: Self-pay | Admitting: Emergency Medicine

## 2020-01-12 MED ORDER — ALBUTEROL SULFATE HFA 108 (90 BASE) MCG/ACT IN AERS: 2.0000 | INHALATION_SPRAY | RESPIRATORY_TRACT | 2 refills | Status: DC | PRN

## 2020-01-12 MED ORDER — BUDESONIDE-FORMOTEROL FUMARATE 160-4.5 MCG/ACT IN AERO: 2.0000 | INHALATION_SPRAY | Freq: Two times a day (BID) | RESPIRATORY_TRACT | 2 refills | Status: DC

## 2020-01-12 NOTE — Telephone Encounter (Signed)
Rx's for Symbicort and Albuterol sent to the pharmacy. Nothing further is needed.

## 2020-02-22 ENCOUNTER — Other Ambulatory Visit: Payer: Self-pay | Admitting: Emergency Medicine

## 2020-02-22 NOTE — Telephone Encounter (Signed)
Patient requesting Prednisone 10 mg tablet refill, taking one tablet daily, last office visit 12/18/19, last refill 11/21/2019.

## 2020-02-26 ENCOUNTER — Telehealth: Payer: Self-pay | Admitting: Emergency Medicine

## 2020-02-26 MED ORDER — PREDNISONE 10 MG PO TABS: 10.0000 mg | ORAL_TABLET | Freq: Every day | ORAL | 0 refills | Status: DC

## 2020-02-26 NOTE — Telephone Encounter (Signed)
Spoke with pt and advised rx sent to pharmacy. Nothing further is needed.   

## 2020-02-26 NOTE — Telephone Encounter (Signed)
ATC pt, no answer. Left message for pt to call back.  

## 2020-03-01 ENCOUNTER — Other Ambulatory Visit: Payer: Self-pay

## 2020-03-04 ENCOUNTER — Encounter: Payer: Self-pay | Admitting: Family Medicine

## 2020-03-04 ENCOUNTER — Ambulatory Visit (INDEPENDENT_AMBULATORY_CARE_PROVIDER_SITE_OTHER): Payer: BC Managed Care – PPO | Admitting: Family Medicine

## 2020-03-04 ENCOUNTER — Other Ambulatory Visit: Payer: Self-pay

## 2020-03-04 VITALS — BP 167/84 | HR 100 | Temp 98.9°F | Ht 73.0 in | Wt 262.0 lb

## 2020-03-04 DIAGNOSIS — K219 Gastro-esophageal reflux disease without esophagitis: Secondary | ICD-10-CM

## 2020-03-04 DIAGNOSIS — K921 Melena: Secondary | ICD-10-CM

## 2020-03-04 DIAGNOSIS — I10 Essential (primary) hypertension: Secondary | ICD-10-CM

## 2020-03-04 DIAGNOSIS — E1159 Type 2 diabetes mellitus with other circulatory complications: Secondary | ICD-10-CM | POA: Diagnosis not present

## 2020-03-04 DIAGNOSIS — J449 Chronic obstructive pulmonary disease, unspecified: Secondary | ICD-10-CM

## 2020-03-04 DIAGNOSIS — E669 Obesity, unspecified: Secondary | ICD-10-CM

## 2020-03-04 DIAGNOSIS — E1169 Type 2 diabetes mellitus with other specified complication: Secondary | ICD-10-CM | POA: Diagnosis not present

## 2020-03-04 DIAGNOSIS — I152 Hypertension secondary to endocrine disorders: Secondary | ICD-10-CM

## 2020-03-04 LAB — BAYER DCA HB A1C WAIVED: HB A1C (BAYER DCA - WAIVED): 8 % — ABNORMAL HIGH (ref <7.0)

## 2020-03-04 MED ORDER — SITAGLIPTIN PHOSPHATE 100 MG PO TABS: 100.0000 mg | ORAL_TABLET | Freq: Every day | ORAL | 3 refills | Status: DC

## 2020-03-04 NOTE — Progress Notes (Signed)
BP (!) 167/84   Pulse 100   Temp 98.9 F (37.2 C)   Ht 6' 1"  (1.854 m)   Wt 262 lb (118.8 kg)   SpO2 (!) 87%   BMI 34.57 kg/m    Subjective:   Patient ID: Craig Arnold, male    DOB: 1956-10-11, 64 y.o.   MRN: [id number]  HPI: Craig Arnold is a 64 y.o. male presenting on 03/04/2020 for Medical Management of Chronic Issues and Diabetes   HPI Type 2 diabetes mellitus Patient comes in today for recheck of his diabetes. Patient has been currently taking Metformin and A1c today is 8.0 which is up.. Patient is currently on an ACE inhibitor/ARB. Patient has not seen an ophthalmologist this year. Patient denies any issues with their feet.   Hypertension Patient is currently on olmesartan, and their blood pressure today is 167/84, he will get a monitor and check it over the next couple weeks and let me know how it is running.. Patient denies any lightheadedness or dizziness. Patient denies headaches, blurred vision, chest pains, shortness of breath, or weakness. Denies any side effects from medication and is content with current medication.   GERD Patient is currently on omeprazole.  She denies any major symptoms or abdominal pain or belching or burping. She denies any blood in her stool or lightheadedness or dizziness.   Hyperlipidemia Patient is coming in for recheck of his hyperlipidemia. The patient is currently taking pravastatin. They deny any issues with myalgias or history of liver damage from it. They deny any focal numbness or weakness or chest pain.   Social anxiety, patient wants a letter for jury duty because of social anxiety and panic attacks.  He says when he gets around people even even in places such as a store he will check to feel like he is going to faint and lightheaded.  And his heart starts fluttering.  Relevant past medical, surgical, family and social history reviewed and updated as indicated. Interim medical history since our last visit reviewed. Allergies and  medications reviewed and updated.  Review of Systems  Constitutional: Negative for chills and fever.  Eyes: Negative for visual disturbance.  Respiratory: Negative for shortness of breath and wheezing.   Cardiovascular: Negative for chest pain and leg swelling.  Musculoskeletal: Negative for back pain and gait problem.  Skin: Negative for rash.  Neurological: Negative for dizziness, weakness and light-headedness.  All other systems reviewed and are negative.   Per HPI unless specifically indicated above   Allergies as of 03/04/2020   No Known Allergies     Medication List       Accurate as of March 04, 2020  9:27 AM. If you have any questions, ask your nurse or doctor.        STOP taking these medications   terbinafine 250 MG tablet Commonly known as: LAMISIL Stopped by: Fransisca Kaufmann Shadasia Oldfield, MD     TAKE these medications   Accu-Chek FastClix Lancet Kit Use to check blood sugars TID   Accu-Chek FastClix Lancets Misc Use to check blood sugars TID   albuterol 108 (90 Base) MCG/ACT inhaler Commonly known as: ProAir HFA Inhale 2 puffs into the lungs every 4 (four) hours as needed.   Blood Glucose Monitor System w/Device Kit 1 Device by Does not apply route 3 (three) times daily.   budesonide-formoterol 160-4.5 MCG/ACT inhaler Commonly known as: Symbicort Inhale 2 puffs into the lungs 2 (two) times daily.   Daliresp 500 MCG Tabs tablet Generic  drug: roflumilast TAKE 1 TABLET BY MOUTH EVERY DAY   glucose blood test strip Use to check blood sugars TID, Dx E11.9   ipratropium-albuterol 0.5-2.5 (3) MG/3ML Soln Commonly known as: DuoNeb Take 3 mLs by nebulization every 6 (six) hours as needed.   loratadine 10 MG tablet Commonly known as: CLARITIN Take 10 mg by mouth daily as needed.   metFORMIN 1000 MG tablet Commonly known as: GLUCOPHAGE Take 1 tablet (1,000 mg total) by mouth 2 (two) times daily with a meal.   olmesartan 20 MG tablet Commonly known as:  BENICAR TAKE 1 TABLET BY MOUTH EVERY DAY   omeprazole 20 MG capsule Commonly known as: PRILOSEC Take 1 capsule (20 mg total) by mouth daily.   pravastatin 40 MG tablet Commonly known as: PRAVACHOL Take 1 tablet (40 mg total) by mouth daily.   predniSONE 10 MG tablet Commonly known as: DELTASONE Take 1 tablet (10 mg total) by mouth daily with breakfast.   sitaGLIPtin 100 MG tablet Commonly known as: Januvia Take 1 tablet (100 mg total) by mouth daily. Started by: Fransisca Kaufmann Bobette Leyh, MD        Objective:   BP (!) 167/84   Pulse 100   Temp 98.9 F (37.2 C)   Ht '6\' 1"'$  (1.854 m)   Wt 262 lb (118.8 kg)   SpO2 (!) 87%   BMI 34.57 kg/m   Wt Readings from Last 3 Encounters:  03/04/20 262 lb (118.8 kg)  09/05/19 251 lb (113.9 kg)  03/09/19 242 lb (109.8 kg)    Physical Exam Vitals and nursing note reviewed.  Constitutional:      General: He is not in acute distress.    Appearance: He is well-developed. He is not diaphoretic.  Eyes:     General: No scleral icterus.    Conjunctiva/sclera: Conjunctivae normal.  Neck:     Thyroid: No thyromegaly.  Cardiovascular:     Rate and Rhythm: Normal rate and regular rhythm.     Heart sounds: Normal heart sounds. No murmur.  Pulmonary:     Effort: Pulmonary effort is normal. No respiratory distress.     Breath sounds: Wheezing (A slight wheeze very faintly heard) present.  Musculoskeletal:        General: Normal range of motion.     Cervical back: Neck supple.  Lymphadenopathy:     Cervical: No cervical adenopathy.  Skin:    General: Skin is warm and dry.     Findings: No rash.  Neurological:     Mental Status: He is alert and oriented to person, place, and time.     Coordination: Coordination normal.  Psychiatric:        Behavior: Behavior normal.       Assessment & Plan:   Problem List Items Addressed This Visit      Cardiovascular and Mediastinum   Hypertension associated with diabetes ([place])   Relevant  Medications   sitaGLIPtin (JANUVIA) 100 MG tablet     Respiratory   COPD (chronic obstructive pulmonary disease) (HCC)     Digestive   GERD   Relevant Orders   CBC with Differential/Platelet     Endocrine   Type 2 diabetes mellitus with other specified complication (HCC) - Primary   Relevant Medications   sitaGLIPtin (JANUVIA) 100 MG tablet   Other Relevant Orders   Bayer DCA Hb A1c Waived   CBC with Differential/Platelet   CMP14+EGFR     Other   Obesity (BMI 30.0-34.9)   Relevant  Orders   Lipid panel      Will add Januvia to control his blood sugars better, he will get a blood pressure machine and monitor more closely at home.  Continue other medications, will monitor blood pressure at home. Follow up plan: Return in about 3 months (around 06/04/2020), or if symptoms worsen or fail to improve, for Diabetes recheck.  Counseling provided for all of the vaccine components Orders Placed This Encounter  Procedures  . Bayer DCA Hb A1c Waived  . CBC with Differential/Platelet  . CMP14+EGFR  . Lipid panel    Caryl Pina, MD [facility] 03/04/2020, 9:27 AM

## 2020-03-05 LAB — CMP14+EGFR
ALT: 11 IU/L (ref 0–44)
AST: 13 IU/L (ref 0–40)
Albumin/Globulin Ratio: 1.7 (ref 1.2–2.2)
Albumin: 4.2 g/dL (ref 3.8–4.8)
Alkaline Phosphatase: 66 IU/L (ref 39–117)
BUN/Creatinine Ratio: 9 — ABNORMAL LOW (ref 10–24)
BUN: 8 mg/dL (ref 8–27)
Bilirubin Total: 0.5 mg/dL (ref 0.0–1.2)
CO2: 22 mmol/L (ref 20–29)
Calcium: 8.6 mg/dL (ref 8.6–10.2)
Chloride: 102 mmol/L (ref 96–106)
Creatinine, Ser: 0.87 mg/dL (ref 0.76–1.27)
GFR calc Af Amer: 106 mL/min/{1.73_m2} (ref >59)
GFR calc non Af Amer: 92 mL/min/{1.73_m2} (ref >59)
Globulin, Total: 2.5 g/dL (ref 1.5–4.5)
Glucose: 141 mg/dL — ABNORMAL HIGH (ref 65–99)
Potassium: 3.8 mmol/L (ref 3.5–5.2)
Sodium: 141 mmol/L (ref 134–144)
Total Protein: 6.7 g/dL (ref 6.0–8.5)

## 2020-03-05 LAB — LIPID PANEL
Chol/HDL Ratio: 3.3 ratio (ref 0.0–5.0)
Cholesterol, Total: 130 mg/dL (ref 100–199)
HDL: 39 mg/dL — ABNORMAL LOW (ref >39)
LDL Chol Calc (NIH): 69 mg/dL (ref 0–99)
Triglycerides: 122 mg/dL (ref 0–149)
VLDL Cholesterol Cal: 22 mg/dL (ref 5–40)

## 2020-03-05 LAB — CBC WITH DIFFERENTIAL/PLATELET
Basophils Absolute: 0.1 10*3/uL (ref 0.0–0.2)
Basos: 1 %
EOS (ABSOLUTE): 0.2 10*3/uL (ref 0.0–0.4)
Eos: 3 %
Hematocrit: 36.6 % — ABNORMAL LOW (ref 37.5–51.0)
Hemoglobin: 11.7 g/dL — ABNORMAL LOW (ref 13.0–17.7)
Immature Grans (Abs): 0 10*3/uL (ref 0.0–0.1)
Immature Granulocytes: 1 %
Lymphocytes Absolute: 1.6 10*3/uL (ref 0.7–3.1)
Lymphs: 24 %
MCH: 25 pg — ABNORMAL LOW (ref 26.6–33.0)
MCHC: 32 g/dL (ref 31.5–35.7)
MCV: 78 fL — ABNORMAL LOW (ref 79–97)
Monocytes Absolute: 0.8 10*3/uL (ref 0.1–0.9)
Monocytes: 12 %
Neutrophils Absolute: 4 10*3/uL (ref 1.4–7.0)
Neutrophils: 59 %
Platelets: 270 10*3/uL (ref 150–450)
RBC: 4.68 x10E6/uL (ref 4.14–5.80)
RDW: 14.2 % (ref 11.6–15.4)
WBC: 6.6 10*3/uL (ref 3.4–10.8)

## 2020-03-06 NOTE — Addendum Note (Signed)
Addended by: Caryl Pina on: 03/06/2020 04:35 PM   Modules accepted: Orders

## 2020-03-11 ENCOUNTER — Encounter: Payer: Self-pay | Admitting: Internal Medicine

## 2020-03-11 ENCOUNTER — Telehealth: Payer: Self-pay | Admitting: Family Medicine

## 2020-03-11 NOTE — Telephone Encounter (Signed)
Pt aware of results and that a referral was placed for GI and voiced understanding.

## 2020-03-27 ENCOUNTER — Ambulatory Visit: Payer: BC Managed Care – PPO | Admitting: Nurse Practitioner

## 2020-04-02 ENCOUNTER — Other Ambulatory Visit: Payer: Self-pay | Admitting: Emergency Medicine

## 2020-04-02 ENCOUNTER — Other Ambulatory Visit: Payer: Self-pay | Admitting: Family Medicine

## 2020-04-02 DIAGNOSIS — E1159 Type 2 diabetes mellitus with other circulatory complications: Secondary | ICD-10-CM

## 2020-06-05 ENCOUNTER — Ambulatory Visit: Payer: BC Managed Care – PPO | Admitting: Family Medicine

## 2020-06-05 ENCOUNTER — Encounter: Payer: Self-pay | Admitting: Family Medicine

## 2020-06-05 ENCOUNTER — Other Ambulatory Visit: Payer: Self-pay

## 2020-06-05 VITALS — BP 147/79 | HR 84 | Temp 97.3°F | Ht 73.0 in | Wt 251.0 lb

## 2020-06-05 DIAGNOSIS — E1169 Type 2 diabetes mellitus with other specified complication: Secondary | ICD-10-CM | POA: Diagnosis not present

## 2020-06-05 DIAGNOSIS — I152 Hypertension secondary to endocrine disorders: Secondary | ICD-10-CM

## 2020-06-05 DIAGNOSIS — E1159 Type 2 diabetes mellitus with other circulatory complications: Secondary | ICD-10-CM | POA: Diagnosis not present

## 2020-06-05 DIAGNOSIS — D649 Anemia, unspecified: Secondary | ICD-10-CM | POA: Diagnosis not present

## 2020-06-05 DIAGNOSIS — I10 Essential (primary) hypertension: Secondary | ICD-10-CM | POA: Diagnosis not present

## 2020-06-05 LAB — CBC WITH DIFFERENTIAL/PLATELET
Basophils Absolute: 0 10*3/uL (ref 0.0–0.2)
Basos: 1 %
EOS (ABSOLUTE): 0.2 10*3/uL (ref 0.0–0.4)
Eos: 3 %
Hematocrit: 31 % — ABNORMAL LOW (ref 37.5–51.0)
Hemoglobin: 9.9 g/dL — ABNORMAL LOW (ref 13.0–17.7)
Immature Grans (Abs): 0 10*3/uL (ref 0.0–0.1)
Immature Granulocytes: 1 %
Lymphocytes Absolute: 1.3 10*3/uL (ref 0.7–3.1)
Lymphs: 21 %
MCH: 24.4 pg — ABNORMAL LOW (ref 26.6–33.0)
MCHC: 31.9 g/dL (ref 31.5–35.7)
MCV: 76 fL — ABNORMAL LOW (ref 79–97)
Monocytes Absolute: 0.7 10*3/uL (ref 0.1–0.9)
Monocytes: 11 %
Neutrophils Absolute: 3.9 10*3/uL (ref 1.4–7.0)
Neutrophils: 63 %
Platelets: 238 10*3/uL (ref 150–450)
RBC: 4.06 x10E6/uL — ABNORMAL LOW (ref 4.14–5.80)
RDW: 15.1 % (ref 11.6–15.4)
WBC: 6.2 10*3/uL (ref 3.4–10.8)

## 2020-06-05 LAB — BAYER DCA HB A1C WAIVED: HB A1C (BAYER DCA - WAIVED): 7.1 % — ABNORMAL HIGH (ref <7.0)

## 2020-06-05 NOTE — Progress Notes (Signed)
BP (!) 147/79   Pulse 84   Temp (!) 97.3 F (36.3 C)   Ht _0  (1.854 m)   Wt 251 lb (113.9 kg)   SpO2 100%   BMI 33.12 kg/m    Subjective:   Patient ID: Craig Arnold, male    DOB: 03/03/1956, 64 y.o.   MRN: [id number]  HPI: Craig Arnold is a 64 y.o. male presenting on 06/05/2020 for Medical Management of Chronic Issues, Diabetes, and Hypertension   HPI Type 2 diabetes mellitus Patient comes in today for recheck of his diabetes. Patient has been currently taking Metformin and Januvia, A1c is 7.1, has been focusing a lot more on diet eating smaller portions and it has been helping.  Is also lost couple pounds. Patient is currently on an ACE inhibitor/ARB. Patient has seen an ophthalmologist this year. Patient denies any issues with their feet. The symptom started onset as an adult hypertension ARE RELATED TO DM   Hypertension Patient is currently on olmesartan, and their blood pressure today is 147/79. Patient denies any lightheadedness or dizziness. Patient denies headaches, blurred vision, chest pains, shortness of breath, or weakness. Denies any side effects from medication and is content with current medication.   Patient has been slightly more anemic over the past few blood draws, down to a hemoglobin of 11.1 recently.  Relevant past medical, surgical, family and social history reviewed and updated as indicated. Interim medical history since our last visit reviewed. Allergies and medications reviewed and updated.  Review of Systems  Constitutional: Negative for chills and fever.  Eyes: Negative for discharge.  Respiratory: Negative for shortness of breath and wheezing.   Cardiovascular: Negative for chest pain and leg swelling.  Musculoskeletal: Negative for back pain and gait problem.  Skin: Negative for rash.  All other systems reviewed and are negative.   Per HPI unless specifically indicated above   Allergies as of 06/05/2020   No Known Allergies     Medication  List       Accurate as of June 05, 2020 10:24 AM. If you have any questions, ask your nurse or doctor.        Accu-Chek [employer] Kit Use to check blood sugars TID   Accu-Chek FastClix Lancets Misc Use to check blood sugars TID   albuterol 108 (90 Base) MCG/ACT inhaler Commonly known as: ProAir HFA Inhale 2 puffs into the lungs every 4 (four) hours as needed.   Blood Glucose Monitor System w/Device Kit 1 Device by Does not apply route 3 (three) times daily.   budesonide-formoterol 160-4.5 MCG/ACT inhaler Commonly known as: Symbicort Inhale 2 puffs into the lungs 2 (two) times daily.   Daliresp 500 MCG Tabs tablet Generic drug: roflumilast TAKE 1 TABLET BY MOUTH EVERY DAY   glucose blood test strip Use to check blood sugars TID, Dx E11.9   ipratropium-albuterol 0.5-2.5 (3) MG/3ML Soln Commonly known as: DuoNeb Take 3 mLs by nebulization every 6 (six) hours as needed.   loratadine 10 MG tablet Commonly known as: CLARITIN Take 10 mg by mouth daily as needed.   metFORMIN 1000 MG tablet Commonly known as: GLUCOPHAGE Take 1 tablet (1,000 mg total) by mouth 2 (two) times daily with a meal.   olmesartan 20 MG tablet Commonly known as: BENICAR TAKE 1 TABLET BY MOUTH EVERY DAY   omeprazole 20 MG capsule Commonly known as: PRILOSEC Take 1 capsule (20 mg total) by mouth daily.   pravastatin 40 MG tablet Commonly known as: PRAVACHOL  Take 1 tablet (40 mg total) by mouth daily.   predniSONE 10 MG tablet Commonly known as: DELTASONE TAKE 1 TABLET (10 MG TOTAL) BY MOUTH DAILY WITH BREAKFAST.   sitaGLIPtin 100 MG tablet Commonly known as: Januvia Take 1 tablet (100 mg total) by mouth daily.        Objective:   BP (!) 147/79   Pulse 84   Temp (!) 97.3 F (36.3 C)   Ht _0  (1.854 m)   Wt 251 lb (113.9 kg)   SpO2 100%   BMI 33.12 kg/m   Wt Readings from Last 3 Encounters:  06/05/20 251 lb (113.9 kg)  03/04/20 262 lb (118.8 kg)  09/05/19 251 lb  (113.9 kg)    Physical Exam Vitals and nursing note reviewed.  Constitutional:      General: He is not in acute distress.    Appearance: He is well-developed. He is not diaphoretic.  Eyes:     General: No scleral icterus.    Conjunctiva/sclera: Conjunctivae normal.  Neck:     Thyroid: No thyromegaly.  Cardiovascular:     Rate and Rhythm: Normal rate and regular rhythm.     Heart sounds: Normal heart sounds. No murmur heard.   Pulmonary:     Effort: Pulmonary effort is normal. No respiratory distress.     Breath sounds: Normal breath sounds. No wheezing.  Musculoskeletal:        General: Normal range of motion.     Cervical back: Neck supple.  Lymphadenopathy:     Cervical: No cervical adenopathy.  Skin:    General: Skin is warm and dry.     Findings: No rash.  Neurological:     Mental Status: He is alert and oriented to person, place, and time.     Coordination: Coordination normal.  Psychiatric:        Behavior: Behavior normal.       Assessment & Plan:   Problem List Items Addressed This Visit      Cardiovascular and Mediastinum   Hypertension associated with diabetes ([place])     Endocrine   Type 2 diabetes mellitus with other specified complication ([place]) - Primary   Relevant Orders   Bayer DCA Hb A1c Waived     Other   Anemia   Relevant Orders   CBC with Differential/Platelet      Allowing permissive hypertension, A1c is 7.1 which is a lot better than he was previously so continue current medication and continue to focus on diet.  Seems like he is doing very well. Follow up plan: Return in about 3 months (around 09/05/2020), or if symptoms worsen or fail to improve, for Diabetes.  Counseling provided for all of the vaccine components Orders Placed This Encounter  Procedures  . Bayer DCA Hb A1c Waived  . CBC with Differential/Platelet    Caryl Pina, MD [facility] 06/05/2020, 10:24 AM

## 2020-06-10 ENCOUNTER — Other Ambulatory Visit: Payer: BC Managed Care – PPO

## 2020-06-10 ENCOUNTER — Other Ambulatory Visit: Payer: Self-pay

## 2020-06-10 DIAGNOSIS — D649 Anemia, unspecified: Secondary | ICD-10-CM

## 2020-06-10 LAB — CBC WITH DIFFERENTIAL/PLATELET
Basophils Absolute: 0.1 10*3/uL (ref 0.0–0.2)
Basos: 1 %
EOS (ABSOLUTE): 0.2 10*3/uL (ref 0.0–0.4)
Eos: 3 %
Hematocrit: 30.4 % — ABNORMAL LOW (ref 37.5–51.0)
Hemoglobin: 9.8 g/dL — ABNORMAL LOW (ref 13.0–17.7)
Immature Grans (Abs): 0 10*3/uL (ref 0.0–0.1)
Immature Granulocytes: 1 %
Lymphocytes Absolute: 1.5 10*3/uL (ref 0.7–3.1)
Lymphs: 19 %
MCH: 24.3 pg — ABNORMAL LOW (ref 26.6–33.0)
MCHC: 32.2 g/dL (ref 31.5–35.7)
MCV: 75 fL — ABNORMAL LOW (ref 79–97)
Monocytes Absolute: 1 10*3/uL — ABNORMAL HIGH (ref 0.1–0.9)
Monocytes: 13 %
Neutrophils Absolute: 5 10*3/uL (ref 1.4–7.0)
Neutrophils: 63 %
Platelets: 240 10*3/uL (ref 150–450)
RBC: 4.03 x10E6/uL — ABNORMAL LOW (ref 4.14–5.80)
RDW: 15.6 % — ABNORMAL HIGH (ref 11.6–15.4)
WBC: 7.8 10*3/uL (ref 3.4–10.8)

## 2020-06-10 LAB — HEMOGLOBIN, FINGERSTICK: Hemoglobin: 9.7 g/dL — ABNORMAL LOW (ref 12.6–17.7)

## 2020-06-12 ENCOUNTER — Telehealth: Payer: Self-pay

## 2020-06-12 NOTE — Telephone Encounter (Signed)
Pt is concerned about low HgB. Informed pt that it has been consistently at 9 the last few blood draws. He does have have a h/o dark stools, hemorrhoids. Pt has never had a colonoscopy. Last September his Hgb was 12 and has been declining. Instructed pt that we could refer him to GI for a colonoscopy and he declined at this time due to financial reasons. His wife is out of work. He will eat iron rich foods in the meantime and follow up with [facility] in September. Advised to call if he feels weak, tired, or stools become black.

## 2020-07-01 ENCOUNTER — Other Ambulatory Visit: Payer: Self-pay | Admitting: Emergency Medicine

## 2020-07-02 ENCOUNTER — Other Ambulatory Visit: Payer: Self-pay | Admitting: Emergency Medicine

## 2020-07-02 NOTE — Telephone Encounter (Signed)
Pt is requesting refill on prednisone 10 mg lov  02/26/20

## 2020-08-13 ENCOUNTER — Telehealth: Payer: Self-pay | Admitting: Emergency Medicine

## 2020-08-13 MED ORDER — PREDNISONE 10 MG PO TABS: 10.0000 mg | ORAL_TABLET | Freq: Every day | ORAL | 0 refills | Status: DC

## 2020-08-13 NOTE — Telephone Encounter (Signed)
Called and spoke with pt letting him know that [facility] said a televisit is okay and he verbalized understanding. appt has been scheduled for pt. Nothing further needed.

## 2020-08-13 NOTE — Telephone Encounter (Signed)
Televist will be ok thanks

## 2020-08-13 NOTE — Telephone Encounter (Signed)
Spoke with the pt  He was due for 6 month rov with [facility] in June  States wants to make appt but needs it to be televisit bc he fears coming in due to covid/delta variant  Has NOT had his vaccine  He states that he is out of pred so I refilled  Please advise if okay to make him appt for televisit or if he will need in person, thanks

## 2020-08-19 ENCOUNTER — Other Ambulatory Visit: Payer: Self-pay

## 2020-08-19 ENCOUNTER — Ambulatory Visit (INDEPENDENT_AMBULATORY_CARE_PROVIDER_SITE_OTHER): Payer: BC Managed Care – PPO | Admitting: Emergency Medicine

## 2020-08-19 ENCOUNTER — Encounter: Payer: Self-pay | Admitting: Emergency Medicine

## 2020-08-19 DIAGNOSIS — J449 Chronic obstructive pulmonary disease, unspecified: Secondary | ICD-10-CM

## 2020-08-19 MED ORDER — DALIRESP 500 MCG PO TABS: 500.0000 ug | ORAL_TABLET | Freq: Every day | ORAL | 3 refills | Status: DC

## 2020-08-19 NOTE — Progress Notes (Signed)
Virtual Visit via Telephone Note  I connected with Craig Arnold on 08/19/20 at  9:00 AM EDT by telephone and verified that I am speaking with the correct person using two identifiers.  Location: Patient: Home Provider: Office   I discussed the limitations, risks, security and privacy concerns of performing an evaluation and management service by telephone and the availability of in person appointments. I also discussed with the patient that there may be a patient responsible charge related to this service. The patient expressed understanding and agreed to proceed.   History of Present Illness: Telephone visit for 64 year old man with COPD with asthmatic features, chronic cough with postnasal drip.  Also with a remote history of treated MAIC and associated bronchiectasis.  Subsequent cultures have been negative.   Observations/Objective: Currently managed on Symbicort, Daliresp and chronic prednisone at 10 mg daily, omeprazole.  He uses loratadine as needed.  No longer has DuoNeb available as needed. He is remaining active, is working on his farm. He has intermittent dyspnea, with exertion. Can have wheezing at any time. He uses his albuterol 1-2x a day. No flares since last time.   He doesn't usually get the flu shot. He doesn't want the COVID vaccine.   Assessment and Plan:  Severe COPD/asthma.  Plan to continue chronic prednisone, Daliresp, Symbicort.  Albuterol as needed.  He is on a fairly stable regimen without any recent exacerbations.  Bronchiectasis.  Overall stable clinically.  When we see each other next time in office we can talk about timing of any repeat CT chest to characterize his bronchiectasis, look for any evidence of micronodular disease given his history of MAIC  Reviewed the Covid vaccine with him today.  He is leaning against getting it but is contemplating.   Follow Up Instructions: 6 months    I discussed the assessment and treatment plan with the patient. The  patient was provided an opportunity to ask questions and all were answered. The patient agreed with the plan and demonstrated an understanding of the instructions.   The patient was advised to call back or seek an in-person evaluation if the symptoms worsen or if the condition fails to improve as anticipated.  I provided 16 minutes of non-face-to-face time during this encounter.   Collene Gobble, MD

## 2020-08-19 NOTE — Addendum Note (Signed)
Addended by: Gavin Potters R on: 08/19/2020 10:24 AM   Modules accepted: Orders

## 2020-09-11 ENCOUNTER — Encounter: Payer: Self-pay | Admitting: Family Medicine

## 2020-09-11 ENCOUNTER — Ambulatory Visit (INDEPENDENT_AMBULATORY_CARE_PROVIDER_SITE_OTHER): Payer: BC Managed Care – PPO | Admitting: Family Medicine

## 2020-09-11 DIAGNOSIS — E1159 Type 2 diabetes mellitus with other circulatory complications: Secondary | ICD-10-CM

## 2020-09-11 DIAGNOSIS — I10 Essential (primary) hypertension: Secondary | ICD-10-CM

## 2020-09-11 DIAGNOSIS — K219 Gastro-esophageal reflux disease without esophagitis: Secondary | ICD-10-CM | POA: Diagnosis not present

## 2020-09-11 DIAGNOSIS — E1169 Type 2 diabetes mellitus with other specified complication: Secondary | ICD-10-CM

## 2020-09-11 DIAGNOSIS — J449 Chronic obstructive pulmonary disease, unspecified: Secondary | ICD-10-CM

## 2020-09-11 MED ORDER — OLMESARTAN MEDOXOMIL 20 MG PO TABS: 20.0000 mg | ORAL_TABLET | Freq: Every day | ORAL | 3 refills | Status: DC

## 2020-09-11 MED ORDER — PRAVASTATIN SODIUM 40 MG PO TABS: 40.0000 mg | ORAL_TABLET | Freq: Every day | ORAL | 3 refills | Status: DC

## 2020-09-11 MED ORDER — SITAGLIPTIN PHOSPHATE 100 MG PO TABS
100.0000 mg | ORAL_TABLET | Freq: Every day | ORAL | 3 refills | Status: DC
Start: 2020-09-11 — End: 2021-06-30

## 2020-09-11 MED ORDER — FLUTICASONE PROPIONATE 50 MCG/ACT NA SUSP
1.0000 | Freq: Two times a day (BID) | NASAL | 6 refills | Status: AC | PRN
Start: 2020-09-11 — End: ?

## 2020-09-11 MED ORDER — METFORMIN HCL 1000 MG PO TABS: 1000.0000 mg | ORAL_TABLET | Freq: Two times a day (BID) | ORAL | 3 refills | Status: DC

## 2020-09-11 MED ORDER — OMEPRAZOLE 20 MG PO CPDR: 20.0000 mg | DELAYED_RELEASE_CAPSULE | Freq: Every day | ORAL | 3 refills | Status: DC

## 2020-09-11 NOTE — Progress Notes (Signed)
Virtual Visit via telephone Note  I connected with Craig Arnold on 09/11/20 at 0857 by telephone and verified that I am speaking with the correct person using two identifiers. Craig Arnold is currently located at home and no other people are currently with her during visit. The provider, Fransisca Kaufmann Beauregard Jarrells, MD is located in their office at time of visit.  Call ended at [phone]  I discussed the limitations, risks, security and privacy concerns of performing an evaluation and management service by telephone and the availability of in person appointments. I also discussed with the patient that there may be a patient responsible charge related to this service. The patient expressed understanding and agreed to proceed.  152/86 History and Present Illness: Type 2 diabetes mellitus Patient comes in today for recheck of his diabetes. Patient has been currently taking Januvia and Metformin, 167 today blood sugar at home. Patient is currently on an ACE inhibitor/ARB. Patient has seen an ophthalmologist this year. Patient denies any issues with their feet. The symptom started onset as an adult htn and gerd ARE RELATED TO DM   Hypertension Patient is currently on olmesartan, and their blood pressure today is 152/86. Patient denies any lightheadedness or dizziness. Patient denies headaches, blurred vision, chest pains, shortness of breath, or weakness. Denies any side effects from medication and is content with current medication.   COPD Patient is coming in for COPD recheck today.  He is currently on symbicort and albuterol.  He has a mild chronic cough but denies any major coughing spells or wheezing spells.  He has 0nighttime symptoms per week and 0daytime symptoms per week currently.   No diagnosis found.  Outpatient Encounter Medications as of 09/11/2020  Medication Sig  . ACCU-CHEK FASTCLIX LANCETS MISC Use to check blood sugars TID  . albuterol (PROAIR HFA) 108 (90 Base) MCG/ACT inhaler Inhale 2  puffs into the lungs every 4 (four) hours as needed.  . Blood Glucose Monitoring Suppl (BLOOD GLUCOSE MONITOR SYSTEM) w/Device KIT 1 Device by Does not apply route 3 (three) times daily.  . budesonide-formoterol (SYMBICORT) 160-4.5 MCG/ACT inhaler Inhale 2 puffs into the lungs 2 (two) times daily.  [employer] glucose blood test strip Use to check blood sugars TID, Dx E11.9  . ipratropium-albuterol (DUONEB) 0.5-2.5 (3) MG/3ML SOLN Take 3 mLs by nebulization every 6 (six) hours as needed. (Patient not taking: Reported on 08/19/2020)  . Lancets Misc. (ACCU-CHEK FASTCLIX LANCET) KIT Use to check blood sugars TID  . loratadine (CLARITIN) 10 MG tablet Take 10 mg by mouth daily as needed.   . metFORMIN (GLUCOPHAGE) 1000 MG tablet Take 1 tablet (1,000 mg total) by mouth 2 (two) times daily with a meal.  . olmesartan (BENICAR) 20 MG tablet TAKE 1 TABLET BY MOUTH EVERY DAY  . omeprazole (PRILOSEC) 20 MG capsule Take 1 capsule (20 mg total) by mouth daily.  . pravastatin (PRAVACHOL) 40 MG tablet Take 1 tablet (40 mg total) by mouth daily.  . predniSONE (DELTASONE) 10 MG tablet Take 1 tablet (10 mg total) by mouth daily with breakfast.  . roflumilast (DALIRESP) 500 MCG TABS tablet Take 1 tablet (500 mcg total) by mouth daily.  . sitaGLIPtin (JANUVIA) 100 MG tablet Take 1 tablet (100 mg total) by mouth daily.   No facility-administered encounter medications on file as of 09/11/2020.    Review of Systems  Constitutional: Negative for chills and fever.  Eyes: Negative for visual disturbance.  Respiratory: Negative for shortness of breath and wheezing.  Cardiovascular: Negative for chest pain and leg swelling.  Musculoskeletal: Negative for back pain and gait problem.  Skin: Negative for rash.  Neurological: Negative for dizziness, weakness and light-headedness.  All other systems reviewed and are negative.   Observations/Objective: Patient sounds comfortable and in no acute distress on the phone  Assessment  and Plan: Problem List Items Addressed This Visit      Cardiovascular and Mediastinum   Hypertension associated with diabetes ([place])   Relevant Medications   metFORMIN (GLUCOPHAGE) 1000 MG tablet   olmesartan (BENICAR) 20 MG tablet   pravastatin (PRAVACHOL) 40 MG tablet   sitaGLIPtin (JANUVIA) 100 MG tablet     Respiratory   COPD (chronic obstructive pulmonary disease) (H[facility])   Relevant Medications   fluticasone (FLONASE) 50 MCG/ACT nasal spray     Digestive   GERD   Relevant Medications   omeprazole (PRILOSEC) 20 MG capsule     Endocrine   Type 2 diabetes mellitus with other specified complication ([facility]) - Primary   Relevant Medications   metFORMIN (GLUCOPHAGE) 1000 MG tablet   olmesartan (BENICAR) 20 MG tablet   pravastatin (PRAVACHOL) 40 MG tablet   sitaGLIPtin (JANUVIA) 100 MG tablet      Per patient sounds like things are going well, no medication changes.  Continue current treatment Follow up plan: Return in about 3 months (around 12/11/2020), or if symptoms worsen or fail to improve, for diabetes and htn.     I discussed the assessment and treatment plan with the patient. The patient was provided an opportunity to ask questions and all were answered. The patient agreed with the plan and demonstrated an understanding of the instructions.   The patient was advised to call back or seek an in-person evaluation if the symptoms worsen or if the condition fails to improve as anticipated.  The above assessment and management plan was discussed with the patient. The patient verbalized understanding of and has agreed to the management plan. Patient is aware to call the clinic if symptoms persist or worsen. Patient is aware when to return to the clinic for a follow-up visit. Patient educated on when it is appropriate to go to the emergency department.    I provided 7 minutes of non-face-to-face time during this encounter.    Worthy Rancher, MD

## 2020-10-08 ENCOUNTER — Other Ambulatory Visit: Payer: Self-pay | Admitting: Family Medicine

## 2020-10-20 ENCOUNTER — Other Ambulatory Visit: Payer: Self-pay | Admitting: Family Medicine

## 2020-11-04 ENCOUNTER — Other Ambulatory Visit: Payer: Self-pay | Admitting: Emergency Medicine

## 2020-11-11 ENCOUNTER — Telehealth: Payer: Self-pay | Admitting: Emergency Medicine

## 2020-11-11 MED ORDER — PREDNISONE 10 MG PO TABS
10.0000 mg | ORAL_TABLET | Freq: Every day | ORAL | 1 refills | Status: DC
Start: 2020-11-11 — End: 2021-10-02

## 2020-11-11 NOTE — Telephone Encounter (Signed)
Spoke with the pt  He is asking for a refill on his pred 10 mg that he takes as maintenance med  I have sent this to his preferred pharm  Nothing further needed

## 2020-12-05 ENCOUNTER — Other Ambulatory Visit: Payer: Self-pay | Admitting: Emergency Medicine

## 2020-12-19 ENCOUNTER — Ambulatory Visit: Payer: BC Managed Care – PPO | Admitting: Family Medicine

## 2020-12-19 ENCOUNTER — Encounter: Payer: Self-pay | Admitting: Family Medicine

## 2020-12-19 ENCOUNTER — Other Ambulatory Visit: Payer: Self-pay | Admitting: Family Medicine

## 2020-12-19 ENCOUNTER — Other Ambulatory Visit: Payer: Self-pay

## 2020-12-19 VITALS — BP 135/79 | HR 113 | Ht 73.0 in | Wt 244.0 lb

## 2020-12-19 DIAGNOSIS — E611 Iron deficiency: Secondary | ICD-10-CM

## 2020-12-19 DIAGNOSIS — I152 Hypertension secondary to endocrine disorders: Secondary | ICD-10-CM | POA: Diagnosis not present

## 2020-12-19 DIAGNOSIS — E1169 Type 2 diabetes mellitus with other specified complication: Secondary | ICD-10-CM

## 2020-12-19 DIAGNOSIS — Z125 Encounter for screening for malignant neoplasm of prostate: Secondary | ICD-10-CM | POA: Diagnosis not present

## 2020-12-19 DIAGNOSIS — J449 Chronic obstructive pulmonary disease, unspecified: Secondary | ICD-10-CM | POA: Diagnosis not present

## 2020-12-19 DIAGNOSIS — E1159 Type 2 diabetes mellitus with other circulatory complications: Secondary | ICD-10-CM | POA: Diagnosis not present

## 2020-12-19 LAB — BAYER DCA HB A1C WAIVED: HB A1C (BAYER DCA - WAIVED): 7.2 % — ABNORMAL HIGH (ref <7.0)

## 2020-12-19 MED ORDER — ACCU-CHEK AVIVA DEVI: 1.0000 | Freq: Two times a day (BID) | 0 refills | Status: DC

## 2020-12-19 NOTE — Progress Notes (Signed)
BP 135/79   Pulse (!) 113   Ht 6' 1"  (1.854 m)   Wt 244 lb (110.7 kg)   SpO2 100%   BMI 32.19 kg/m    Subjective:   Patient ID: Craig Arnold, male    DOB: [birth year], 64 y.o.   MRN: [id number]  HPI: Craig Arnold is a 64 y.o. male presenting on 12/19/2020 for Medical Management of Chronic Issues and Diabetes   HPI Type 2 diabetes mellitus Patient comes in today for recheck of his diabetes. Patient has been currently taking Metformin and Januvia, A1c is 7.2. Patient is currently on an ACE inhibitor/ARB. Patient has seen an ophthalmologist this year. Patient denies any issues with their feet. The symptom started onset as an adult hypertension ARE RELATED TO DM   Hypertension Patient is currently on olmesartan, and their blood pressure today is 135/79.  Patient says he has the occasional orthostatic dizziness when he stands up quickly but denies syncope.  He says only happens once every couple months. Patient denies headaches, blurred vision, chest pains, shortness of breath, or weakness. Denies any side effects from medication and is content with current medication.   COPD/intrinsic asthma Patient is coming in for COPD/asthma recheck today, and this is managed by his pulmonology.  He is currently on albuterol and Symbicort and DuoNeb and Daliresp.  He has a mild chronic cough but denies any major coughing spells or wheezing spells.  He has 1nighttime symptoms per week and 1daytime symptoms per week currently.  He gets some coughing and wheezing but does see pulmonology for this.  Relevant past medical, surgical, family and social history reviewed and updated as indicated. Interim medical history since our last visit reviewed. Allergies and medications reviewed and updated.  Review of Systems  Constitutional: Negative for chills and fever.  Eyes: Negative for discharge.  Respiratory: Positive for cough and wheezing. Negative for shortness of breath.   Cardiovascular: Negative for chest  pain and leg swelling.  Musculoskeletal: Negative for back pain and gait problem.  Skin: Negative for rash.  Neurological: Positive for dizziness and light-headedness. Negative for weakness and numbness.  All other systems reviewed and are negative.   Per HPI unless specifically indicated above   Allergies as of 12/19/2020   No Known Allergies     Medication List       Accurate as of December 19, 2020  9:27 AM. If you have any questions, ask your nurse or doctor.        Accu-Chek [employer] Plus test strip Generic drug: glucose blood Check BS 3 times a day Dx E11.69   Accu-Chek FastClix Lancets Misc Use to check blood sugars TID   Accu-Chek [employer] Lancet Dev Kit CHECK BLOOD SUGARS 3 TIMES A DAY Dx E11.69   albuterol 108 (90 Base) MCG/ACT inhaler Commonly known as: VENTOLIN HFA TAKE 2 PUFFS BY MOUTH EVERY 4 HOURS AS NEEDED   Blood Glucose Monitor System w/Device Kit 1 Device by Does not apply route 3 (three) times daily.   budesonide-formoterol 160-4.5 MCG/ACT inhaler Commonly known as: Symbicort Inhale 2 puffs into the lungs 2 (two) times daily.   Daliresp 500 MCG Tabs tablet Generic drug: roflumilast Take 1 tablet (500 mcg total) by mouth daily.   fluticasone 50 MCG/ACT nasal spray Commonly known as: FLONASE Place 1 spray into both nostrils 2 (two) times daily as needed for allergies or rhinitis.   ipratropium-albuterol 0.5-2.5 (3) MG/3ML Soln Commonly known as: DuoNeb Take 3 mLs by nebulization every 6 (  six) hours as needed.   loratadine 10 MG tablet Commonly known as: CLARITIN Take 10 mg by mouth daily as needed.   metFORMIN 1000 MG tablet Commonly known as: GLUCOPHAGE Take 1 tablet (1,000 mg total) by mouth 2 (two) times daily with a meal.   olmesartan 20 MG tablet Commonly known as: BENICAR Take 1 tablet (20 mg total) by mouth daily.   omeprazole 20 MG capsule Commonly known as: PRILOSEC Take 1 capsule (20 mg total) by mouth daily.   pravastatin  40 MG tablet Commonly known as: PRAVACHOL Take 1 tablet (40 mg total) by mouth daily.   predniSONE 10 MG tablet Commonly known as: DELTASONE Take 1 tablet (10 mg total) by mouth daily with breakfast.   sitaGLIPtin 100 MG tablet Commonly known as: Januvia Take 1 tablet (100 mg total) by mouth daily.        Objective:   BP 135/79   Pulse (!) 113   Ht 6' 1"  (1.854 m)   Wt 244 lb (110.7 kg)   SpO2 100%   BMI 32.19 kg/m   Wt Readings from Last 3 Encounters:  12/19/20 244 lb (110.7 kg)  06/05/20 251 lb (113.9 kg)  03/04/20 262 lb (118.8 kg)    Physical Exam Vitals and nursing note reviewed.  Constitutional:      General: He is not in acute distress.    Appearance: He is well-developed and well-nourished. He is not diaphoretic.  Eyes:     General: No scleral icterus.    Extraocular Movements: EOM normal.     Conjunctiva/sclera: Conjunctivae normal.  Neck:     Thyroid: No thyromegaly.  Cardiovascular:     Rate and Rhythm: Normal rate and regular rhythm.     Pulses: Intact distal pulses.     Heart sounds: Normal heart sounds. No murmur heard.   Pulmonary:     Effort: Pulmonary effort is normal. No respiratory distress.     Breath sounds: Normal breath sounds. No wheezing.  Musculoskeletal:        General: No edema. Normal range of motion.     Cervical back: Neck supple.  Lymphadenopathy:     Cervical: No cervical adenopathy.  Skin:    General: Skin is warm and dry.     Findings: No rash.  Neurological:     Mental Status: He is alert and oriented to person, place, and time.     Coordination: Coordination normal.  Psychiatric:        Mood and Affect: Mood and affect normal.        Behavior: Behavior normal.       Assessment & Plan:   Problem List Items Addressed This Visit      Cardiovascular and Mediastinum   Hypertension associated with diabetes ([place])   Relevant Orders   CBC with Differential/Platelet   CMP14+EGFR   Lipid panel   PSA, total and  free     Respiratory   COPD (chronic obstructive pulmonary disease) ([facility])     Endocrine   Type 2 diabetes mellitus with other specified complication ([facility]) - Primary   Relevant Orders   CBC with Differential/Platelet   CMP14+EGFR   Lipid panel   PSA, total and free   Bayer DCA Hb A1c Waived    Other Visit Diagnoses    Prostate cancer screening       Relevant Orders   PSA, total and free      A1c 7.2 which is not bad considering the holidays.  No change in medication.  Will send new meter for the patient. Follow up plan: Return in about 3 months (around 03/19/2021), or if symptoms worsen or fail to improve, for Diabetes and hypertension and COPD asthma recheck.  Counseling provided for all of the vaccine components Orders Placed This Encounter  Procedures  . CBC with Differential/Platelet  . CMP14+EGFR  . Lipid panel  . PSA, total and free  . Bayer [facility] Hb A1c [place], MD [facility] 12/19/2020, 9:27 AM

## 2020-12-20 LAB — CMP14+EGFR
ALT: 9 IU/L (ref 0–44)
AST: 14 IU/L (ref 0–40)
Albumin/Globulin Ratio: 1.6 (ref 1.2–2.2)
Albumin: 4.4 g/dL (ref 3.8–4.8)
Alkaline Phosphatase: 62 IU/L (ref 44–121)
BUN/Creatinine Ratio: 15 (ref 10–24)
BUN: 22 mg/dL (ref 8–27)
Bilirubin Total: 0.4 mg/dL (ref 0.0–1.2)
CO2: 16 mmol/L — ABNORMAL LOW (ref 20–29)
Calcium: 8.8 mg/dL (ref 8.6–10.2)
Chloride: 97 mmol/L (ref 96–106)
Creatinine, Ser: 1.49 mg/dL — ABNORMAL HIGH (ref 0.76–1.27)
GFR calc Af Amer: 57 mL/min/{1.73_m2} — ABNORMAL LOW (ref >59)
GFR calc non Af Amer: 49 mL/min/{1.73_m2} — ABNORMAL LOW (ref >59)
Globulin, Total: 2.8 g/dL (ref 1.5–4.5)
Glucose: 157 mg/dL — ABNORMAL HIGH (ref 65–99)
Potassium: 4.1 mmol/L (ref 3.5–5.2)
Sodium: 132 mmol/L — ABNORMAL LOW (ref 134–144)
Total Protein: 7.2 g/dL (ref 6.0–8.5)

## 2020-12-20 LAB — CBC WITH DIFFERENTIAL/PLATELET
Basophils Absolute: 0 10*3/uL (ref 0.0–0.2)
Basos: 1 %
EOS (ABSOLUTE): 0 10*3/uL (ref 0.0–0.4)
Eos: 0 %
Hematocrit: 31.8 % — ABNORMAL LOW (ref 37.5–51.0)
Hemoglobin: 9.7 g/dL — ABNORMAL LOW (ref 13.0–17.7)
Immature Grans (Abs): 0 10*3/uL (ref 0.0–0.1)
Immature Granulocytes: 1 %
Lymphocytes Absolute: 1.3 10*3/uL (ref 0.7–3.1)
Lymphs: 19 %
MCH: 20.7 pg — ABNORMAL LOW (ref 26.6–33.0)
MCHC: 30.5 g/dL — ABNORMAL LOW (ref 31.5–35.7)
MCV: 68 fL — ABNORMAL LOW (ref 79–97)
Monocytes Absolute: 1.2 10*3/uL — ABNORMAL HIGH (ref 0.1–0.9)
Monocytes: 19 %
Neutrophils Absolute: 4 10*3/uL (ref 1.4–7.0)
Neutrophils: 60 %
Platelets: 283 10*3/uL (ref 150–450)
RBC: 4.68 x10E6/uL (ref 4.14–5.80)
RDW: 15.9 % — ABNORMAL HIGH (ref 11.6–15.4)
WBC: 6.6 10*3/uL (ref 3.4–10.8)

## 2020-12-20 LAB — PSA, TOTAL AND FREE
PSA, Free Pct: 50 %
PSA, Free: 0.55 ng/mL
Prostate Specific Ag, Serum: 1.1 ng/mL (ref 0.0–4.0)

## 2020-12-20 LAB — LIPID PANEL
Chol/HDL Ratio: 3.4 ratio (ref 0.0–5.0)
Cholesterol, Total: 137 mg/dL (ref 100–199)
HDL: 40 mg/dL (ref >39)
LDL Chol Calc (NIH): 72 mg/dL (ref 0–99)
Triglycerides: 140 mg/dL (ref 0–149)
VLDL Cholesterol Cal: 25 mg/dL (ref 5–40)

## 2020-12-23 ENCOUNTER — Other Ambulatory Visit: Payer: Self-pay | Admitting: Family Medicine

## 2020-12-23 DIAGNOSIS — E1159 Type 2 diabetes mellitus with other circulatory complications: Secondary | ICD-10-CM

## 2020-12-23 DIAGNOSIS — I152 Hypertension secondary to endocrine disorders: Secondary | ICD-10-CM

## 2020-12-23 MED ORDER — FERROUS GLUCONATE 324 (38 FE) MG PO TABS: 324.0000 mg | ORAL_TABLET | Freq: Every day | ORAL | 3 refills | Status: DC

## 2020-12-25 NOTE — Addendum Note (Signed)
Addended by: Lorelee Cover C on: 12/25/2020 03:06 PM   Modules accepted: Orders

## 2020-12-26 ENCOUNTER — Telehealth: Payer: Self-pay | Admitting: Nurse Practitioner

## 2020-12-26 MED ORDER — ONDANSETRON HCL 4 MG PO TABS: 4.0000 mg | ORAL_TABLET | Freq: Every day | ORAL | 1 refills | Status: AC | PRN

## 2020-12-26 NOTE — Telephone Encounter (Signed)
Nausea and vomiting for several weeks. Feels tired. Can keep some liquids down. Wife says he is voiding 2-3 x a day.  Meds ordered this encounter  Medications  . ondansetron (ZOFRAN) 4 MG tablet    Sig: Take 1 tablet (4 mg total) by mouth daily as needed for nausea or vomiting.    Dispense:  30 tablet    Refill:  1    Order Specific Question:   Supervising Provider    Answer:   Arville Care A [[id number]]   First 24 Hours-Clear liquids  popsicles  Jello  gatorade  Sprite Second 24 hours-Add Full liquids ( Liquids you cant see through) Third 24 hours- Bland diet ( foods that are baked or broiled)  *avoiding fried foods and highly spiced foods* During these 3 days  Avoid milk, cheese, ice cream or any other dairy products  Avoid caffeine- REMEMBER Mt. Dew and Mello Yellow contain lots of caffeine You should eat and drink in  Frequent small volumes If no improvement in symptoms or worsen in 2-3 days should RETRUN TO OFFICE or go to ER!

## 2021-01-01 ENCOUNTER — Telehealth: Payer: Self-pay

## 2021-01-01 NOTE — Telephone Encounter (Signed)
Yes that is fine to crush up the metformin pills so that they are easy to swallow, if he continues to have difficulty swallowing then he may need an appointment to be evaluated in the future.

## 2021-01-01 NOTE — Telephone Encounter (Signed)
Attempted to contact patient - NA °

## 2021-01-01 NOTE — Telephone Encounter (Signed)
Please advise if this is okay to do ?  

## 2021-01-01 NOTE — Telephone Encounter (Signed)
Pt called stating that he has had a cough and some congestion for the past few days which has caused his throat to be sore.  Wants to know if he can crush up his Metformin pills so that they are easier to swallow.  Please advise and call patient.

## 2021-01-03 NOTE — Telephone Encounter (Signed)
Pt called in and I read message Dr Warrick Parisian wrote

## 2021-01-18 ENCOUNTER — Other Ambulatory Visit: Payer: Self-pay | Admitting: Family Medicine

## 2021-02-11 ENCOUNTER — Other Ambulatory Visit: Payer: Self-pay | Admitting: Family Medicine

## 2021-02-19 ENCOUNTER — Ambulatory Visit: Payer: BC Managed Care – PPO

## 2021-03-20 ENCOUNTER — Ambulatory Visit: Payer: BC Managed Care – PPO | Admitting: Family Medicine

## 2021-03-31 ENCOUNTER — Ambulatory Visit (INDEPENDENT_AMBULATORY_CARE_PROVIDER_SITE_OTHER): Payer: Medicare Other | Admitting: Family Medicine

## 2021-03-31 ENCOUNTER — Encounter: Payer: Self-pay | Admitting: Family Medicine

## 2021-03-31 ENCOUNTER — Other Ambulatory Visit: Payer: Self-pay

## 2021-03-31 VITALS — BP 147/75 | HR 90 | Ht 73.0 in | Wt 243.0 lb

## 2021-03-31 DIAGNOSIS — E1159 Type 2 diabetes mellitus with other circulatory complications: Secondary | ICD-10-CM

## 2021-03-31 DIAGNOSIS — J449 Chronic obstructive pulmonary disease, unspecified: Secondary | ICD-10-CM

## 2021-03-31 DIAGNOSIS — D649 Anemia, unspecified: Secondary | ICD-10-CM | POA: Diagnosis not present

## 2021-03-31 DIAGNOSIS — E1169 Type 2 diabetes mellitus with other specified complication: Secondary | ICD-10-CM | POA: Diagnosis not present

## 2021-03-31 DIAGNOSIS — N179 Acute kidney failure, unspecified: Secondary | ICD-10-CM | POA: Diagnosis not present

## 2021-03-31 DIAGNOSIS — I152 Hypertension secondary to endocrine disorders: Secondary | ICD-10-CM | POA: Diagnosis not present

## 2021-03-31 LAB — BAYER DCA HB A1C WAIVED: HB A1C (BAYER DCA - WAIVED): 6.2 % (ref <7.0)

## 2021-03-31 NOTE — Progress Notes (Signed)
BP (!) 147/75   Pulse 90   Ht 6' 1"  (1.854 m)   Wt 243 lb (110.2 kg)   SpO2 100%   BMI 32.06 kg/m    Subjective:   Patient ID: Craig Arnold, male    DOB: 03-Nov-1956, 65 y.o.   MRN: [id number]  HPI: Craig Arnold is a 65 y.o. male presenting on 03/31/2021 for Medical Management of Chronic Issues, Diabetes, and Hypertension   HPI Type 2 diabetes mellitus Patient comes in today for recheck of his diabetes. Patient has been currently taking Metformin and Januvia. Patient is currently on an ACE inhibitor/ARB. Patient has not seen an ophthalmologist this year. Patient denies any issues with their feet. The symptom started onset as an adult hypertension ARE RELATED TO DM   Hypertension Patient is currently on olmesartan, and their blood pressure today is 147/75. Patient denies any lightheadedness or dizziness. Patient denies headaches, blurred vision, chest pains, shortness of breath, or weakness. Denies any side effects from medication and is content with current medication.   COPD Patient is coming in for COPD recheck today.  He is currently on Daliresp and Symbicort.  He has a mild chronic cough but denies any major coughing spells or wheezing spells.  He has 2nighttime symptoms per week and 3daytime symptoms per week currently.  Patient sees Dr. Lamonte Sakai in pulmonology  Anemia recheck Patient is coming in for anemia recheck, his blood counts been down with a hemoglobin of 9 but it was stable over the course but had dips from previous when he was over 13.  AKI On last blood check baseline slightly elevated renal function, recheck today.  Recommended hydration between then and now.  Denies any urinary issues  Patient still has some anxiety issues and has not helped since his wife passed away earlier this year.  He wanted a note saying he could not start on jury summons due to anxiety and panic attacks.  Relevant past medical, surgical, family and social history reviewed and updated as  indicated. Interim medical history since our last visit reviewed. Allergies and medications reviewed and updated.  Review of Systems  Constitutional: Negative for chills and fever.  Eyes: Negative for visual disturbance.  Respiratory: Negative for shortness of breath and wheezing.   Cardiovascular: Negative for chest pain and leg swelling.  Musculoskeletal: Negative for back pain and gait problem.  Skin: Negative for rash.  Neurological: Negative for dizziness, speech difficulty and weakness.  Psychiatric/Behavioral: Positive for dysphoric mood. Negative for self-injury, sleep disturbance and suicidal ideas. The patient is nervous/anxious.   All other systems reviewed and are negative.   Per HPI unless specifically indicated above   Allergies as of 03/31/2021   No Known Allergies     Medication List       Accurate as of March 31, 2021  1:00 PM. If you have any questions, ask your nurse or doctor.        Accu-Chek FastClix Lancets Misc Use to check blood sugars TID   Accu-Chek Guide test strip Generic drug: glucose blood CHECK BLOOD SUGAR 3 TIMES A DAY E11.69   Accu-Chek Guide w/Device Kit Check BS 3 times a day Dx E11.69   Accu-Chek Softclix Lancet Dev Kit CHECK BLOOD SUGARS 3 TIMES A DAY Dx E11.69   albuterol 108 (90 Base) MCG/ACT inhaler Commonly known as: VENTOLIN HFA TAKE 2 PUFFS BY MOUTH EVERY 4 HOURS AS NEEDED   budesonide-formoterol 160-4.5 MCG/ACT inhaler Commonly known as: Symbicort Inhale 2 puffs into the  lungs 2 (two) times daily.   Daliresp 500 MCG Tabs tablet Generic drug: roflumilast Take 1 tablet (500 mcg total) by mouth daily.   ferrous gluconate 324 MG tablet Commonly known as: FERGON Take 1 tablet (324 mg total) by mouth daily with breakfast.   fluticasone 50 MCG/ACT nasal spray Commonly known as: FLONASE Place 1 spray into both nostrils 2 (two) times daily as needed for allergies or rhinitis.   ipratropium-albuterol 0.5-2.5 (3) MG/3ML  Soln Commonly known as: DuoNeb Take 3 mLs by nebulization every 6 (six) hours as needed.   loratadine 10 MG tablet Commonly known as: CLARITIN Take 10 mg by mouth daily as needed.   metFORMIN 1000 MG tablet Commonly known as: GLUCOPHAGE Take 1 tablet (1,000 mg total) by mouth 2 (two) times daily with a meal.   olmesartan 20 MG tablet Commonly known as: BENICAR Take 1 tablet (20 mg total) by mouth daily.   omeprazole 20 MG capsule Commonly known as: PRILOSEC Take 1 capsule (20 mg total) by mouth daily.   ondansetron 4 MG tablet Commonly known as: Zofran Take 1 tablet (4 mg total) by mouth daily as needed for nausea or vomiting.   pravastatin 40 MG tablet Commonly known as: PRAVACHOL Take 1 tablet (40 mg total) by mouth daily.   predniSONE 10 MG tablet Commonly known as: DELTASONE Take 1 tablet (10 mg total) by mouth daily with breakfast.   sitaGLIPtin 100 MG tablet Commonly known as: Januvia Take 1 tablet (100 mg total) by mouth daily.        Objective:   BP (!) 147/75   Pulse 90   Ht 6' 1"  (1.854 m)   Wt 243 lb (110.2 kg)   SpO2 100%   BMI 32.06 kg/m   Wt Readings from Last 3 Encounters:  03/31/21 243 lb (110.2 kg)  12/19/20 244 lb (110.7 kg)  06/05/20 251 lb (113.9 kg)    Physical Exam Vitals and nursing note reviewed.  Constitutional:      General: He is not in acute distress.    Appearance: He is well-developed. He is not diaphoretic.  Eyes:     General: No scleral icterus.    Conjunctiva/sclera: Conjunctivae normal.  Neck:     Thyroid: No thyromegaly.  Cardiovascular:     Rate and Rhythm: Normal rate and regular rhythm.     Heart sounds: Normal heart sounds. No murmur heard.   Pulmonary:     Effort: Pulmonary effort is normal. No respiratory distress.     Breath sounds: Normal breath sounds. No wheezing.  Musculoskeletal:        General: Normal range of motion.     Cervical back: Neck supple.  Lymphadenopathy:     Cervical: No cervical  adenopathy.  Skin:    General: Skin is warm and dry.     Findings: No rash.  Neurological:     Mental Status: He is alert and oriented to person, place, and time.     Coordination: Coordination normal.  Psychiatric:        Behavior: Behavior normal.       Assessment & Plan:   Problem List Items Addressed This Visit      Cardiovascular and Mediastinum   Hypertension associated with diabetes ([place])   Relevant Orders   CMP14+EGFR     Respiratory   COPD (chronic obstructive pulmonary disease) ([place])     Endocrine   Type 2 diabetes mellitus with other specified complication ([place])   Relevant Orders  Lipid panel   Bayer DCA Hb A1c Waived     Other   Anemia   Relevant Orders   CBC with Differential/Platelet    Other Visit Diagnoses    AKI (acute kidney injury) ([place])    -  Primary   Relevant Orders   CMP14+EGFR      Patient likely had blood loss causing the AKI and anemia before but has stabilized and he has been trying to hydrate more and we will recheck these levels today.   Follow up plan: Return in about 3 months (around 06/30/2021), or if symptoms worsen or fail to improve, for Diabetes hypertension and anemia recheck.  Counseling provided for all of the vaccine components No orders of the defined types were placed in this encounter.   Caryl Pina, MD [facility] 03/31/2021, 1:00 PM

## 2021-04-01 LAB — CBC WITH DIFFERENTIAL/PLATELET
Basophils Absolute: 0 10*3/uL (ref 0.0–0.2)
Basos: 0 %
EOS (ABSOLUTE): 0.1 10*3/uL (ref 0.0–0.4)
Eos: 2 %
Hematocrit: 38 % (ref 37.5–51.0)
Hemoglobin: 13.1 g/dL (ref 13.0–17.7)
Immature Grans (Abs): 0 10*3/uL (ref 0.0–0.1)
Immature Granulocytes: 0 %
Lymphocytes Absolute: 1.1 10*3/uL (ref 0.7–3.1)
Lymphs: 20 %
MCH: 26.5 pg — ABNORMAL LOW (ref 26.6–33.0)
MCHC: 34.5 g/dL (ref 31.5–35.7)
MCV: 77 fL — ABNORMAL LOW (ref 79–97)
Monocytes Absolute: 0.7 10*3/uL (ref 0.1–0.9)
Monocytes: 13 %
Neutrophils Absolute: 3.3 10*3/uL (ref 1.4–7.0)
Neutrophils: 65 %
Platelets: 170 10*3/uL (ref 150–450)
RBC: 4.95 x10E6/uL (ref 4.14–5.80)
RDW: 14.5 % (ref 11.6–15.4)
WBC: 5.1 10*3/uL (ref 3.4–10.8)

## 2021-04-01 LAB — CMP14+EGFR
ALT: 11 IU/L (ref 0–44)
AST: 12 IU/L (ref 0–40)
Albumin/Globulin Ratio: 1.8 (ref 1.2–2.2)
Albumin: 4.3 g/dL (ref 3.8–4.8)
Alkaline Phosphatase: 71 IU/L (ref 44–121)
BUN/Creatinine Ratio: 6 — ABNORMAL LOW (ref 10–24)
BUN: 5 mg/dL — ABNORMAL LOW (ref 8–27)
Bilirubin Total: 0.6 mg/dL (ref 0.0–1.2)
CO2: 24 mmol/L (ref 20–29)
Calcium: 8.8 mg/dL (ref 8.6–10.2)
Chloride: 99 mmol/L (ref 96–106)
Creatinine, Ser: 0.89 mg/dL (ref 0.76–1.27)
Globulin, Total: 2.4 g/dL (ref 1.5–4.5)
Glucose: 150 mg/dL — ABNORMAL HIGH (ref 65–99)
Potassium: 3.1 mmol/L — ABNORMAL LOW (ref 3.5–5.2)
Sodium: 141 mmol/L (ref 134–144)
Total Protein: 6.7 g/dL (ref 6.0–8.5)
eGFR: 96 mL/min/{1.73_m2} (ref >59)

## 2021-04-01 LAB — LIPID PANEL
Chol/HDL Ratio: 3.6 ratio (ref 0.0–5.0)
Cholesterol, Total: 125 mg/dL (ref 100–199)
HDL: 35 mg/dL — ABNORMAL LOW (ref >39)
LDL Chol Calc (NIH): 64 mg/dL (ref 0–99)
Triglycerides: 153 mg/dL — ABNORMAL HIGH (ref 0–149)
VLDL Cholesterol Cal: 26 mg/dL (ref 5–40)

## 2021-04-02 ENCOUNTER — Telehealth: Payer: Self-pay

## 2021-04-02 NOTE — Telephone Encounter (Signed)
Dr. Warrick Parisian has not been able to review labs yet. Pt informed that we would call as soon as he does. Pt understood.

## 2021-04-03 ENCOUNTER — Telehealth: Payer: Self-pay

## 2021-04-03 NOTE — Telephone Encounter (Signed)
Pt informed to go ahead and continue his Januvia. We will call next week with his results but should stay on his current regimen.

## 2021-04-15 ENCOUNTER — Telehealth: Payer: Self-pay | Admitting: Emergency Medicine

## 2021-04-15 ENCOUNTER — Other Ambulatory Visit (HOSPITAL_COMMUNITY): Payer: Self-pay

## 2021-04-15 NOTE — Telephone Encounter (Signed)
Called spoke with patient.  Let him know we would ask Dr. Lamonte Sakai which medication could be substituted for Daliresp and we let him know once we heard from Dr. Lamonte Sakai.   Sending to Dr. Lamonte Sakai for alternatives for Daliresp 500

## 2021-04-15 NOTE — Telephone Encounter (Signed)
I don't know of a daliresp substitute and I don't think it has gone generic. Will send this note over to our clinical pharmacist - maybe there are avenues to pursue that I do not know. Possibly role for financial assistance from company?

## 2021-04-15 NOTE — Telephone Encounter (Addendum)
Per test claim for Daliresp, prior Josem Kaufmann is not on file. Submitted via CoverMyMeds for urgent review.  Patient may be eligible for patient assistance as well through [employer].  Application Link. Will mail him the application and Dr. Lamonte Sakai can complete provider portion. Left VM with patient to advise that application will arrive in 7-10 business days  Cover My Meds Key: [id number]  Will route to triage for f/u  Knox Saliva, PharmD, MPH Clinical Pharmacist (Rheumatology and Pulmonology)

## 2021-04-16 MED ORDER — BUDESONIDE-FORMOTEROL FUMARATE 160-4.5 MCG/ACT IN AERO: 2.0000 | INHALATION_SPRAY | Freq: Two times a day (BID) | RESPIRATORY_TRACT | 5 refills | Status: DC

## 2021-04-16 MED ORDER — PREDNISONE 10 MG PO TABS: 10.0000 mg | ORAL_TABLET | Freq: Every day | ORAL | 5 refills | Status: DC

## 2021-04-16 MED ORDER — ALBUTEROL SULFATE HFA 108 (90 BASE) MCG/ACT IN AERS: INHALATION_SPRAY | RESPIRATORY_TRACT | 2 refills | Status: DC

## 2021-04-16 NOTE — Telephone Encounter (Signed)
I spoke with the pt and notified of response per Dr Lamonte Sakai. He wishes to have this sent to pharm to have in case he does need it. I have sent this in for him as well as the symbicort and albuterol inhalers. Nothing further needed.

## 2021-04-16 NOTE — Telephone Encounter (Signed)
Suspect based on the history he will need to stay on prednisone, but if he is doing well off of it then we could consider staying off. If he feels he has lost ground off of it then I would refill prednisone 10mg  qd.

## 2021-04-16 NOTE — Telephone Encounter (Signed)
Medication name and strength: Daliresp  PA approved/denied: APPROVED  Approval dates: Approved on April 26 Request Reference Number: [id number]. DALIRESP TAB 500MCG is approved through 12/20/2021. Your patient may now fill this prescription and it will be covered.  Called and spoke with pharmacy to let them know of PA. They said they would get it ready for the patient. Called and spoke with patient to let him know that PA was approved. He expressed understanding.   Dr. Lamonte Sakai patient wants to know if you still want him on Prednisone. States he is out of it and wasn't abe to get it due to no insurance at the time.

## 2021-06-30 ENCOUNTER — Ambulatory Visit (INDEPENDENT_AMBULATORY_CARE_PROVIDER_SITE_OTHER): Payer: Medicare Other | Admitting: Family Medicine

## 2021-06-30 ENCOUNTER — Encounter: Payer: Self-pay | Admitting: Family Medicine

## 2021-06-30 ENCOUNTER — Ambulatory Visit (INDEPENDENT_AMBULATORY_CARE_PROVIDER_SITE_OTHER): Payer: Medicare Other

## 2021-06-30 ENCOUNTER — Other Ambulatory Visit: Payer: Self-pay

## 2021-06-30 VITALS — BP 164/89 | HR 77 | Ht 73.0 in | Wt 224.0 lb

## 2021-06-30 DIAGNOSIS — K219 Gastro-esophageal reflux disease without esophagitis: Secondary | ICD-10-CM | POA: Diagnosis not present

## 2021-06-30 DIAGNOSIS — E1169 Type 2 diabetes mellitus with other specified complication: Secondary | ICD-10-CM

## 2021-06-30 DIAGNOSIS — R059 Cough, unspecified: Secondary | ICD-10-CM | POA: Diagnosis not present

## 2021-06-30 DIAGNOSIS — I152 Hypertension secondary to endocrine disorders: Secondary | ICD-10-CM

## 2021-06-30 DIAGNOSIS — J449 Chronic obstructive pulmonary disease, unspecified: Secondary | ICD-10-CM | POA: Diagnosis not present

## 2021-06-30 DIAGNOSIS — R634 Abnormal weight loss: Secondary | ICD-10-CM

## 2021-06-30 DIAGNOSIS — E1159 Type 2 diabetes mellitus with other circulatory complications: Secondary | ICD-10-CM

## 2021-06-30 LAB — BAYER DCA HB A1C WAIVED: HB A1C (BAYER DCA - WAIVED): 5.8 % (ref <7.0)

## 2021-06-30 MED ORDER — OMEPRAZOLE 20 MG PO CPDR: 20.0000 mg | DELAYED_RELEASE_CAPSULE | Freq: Every day | ORAL | 3 refills | Status: DC

## 2021-06-30 MED ORDER — PRAVASTATIN SODIUM 40 MG PO TABS: 40.0000 mg | ORAL_TABLET | Freq: Every day | ORAL | 3 refills | Status: DC

## 2021-06-30 MED ORDER — OLMESARTAN MEDOXOMIL 20 MG PO TABS: 20.0000 mg | ORAL_TABLET | Freq: Every day | ORAL | 3 refills | Status: DC

## 2021-06-30 MED ORDER — SITAGLIPTIN PHOSPHATE 100 MG PO TABS: 100.0000 mg | ORAL_TABLET | Freq: Every day | ORAL | 3 refills | Status: DC

## 2021-06-30 NOTE — Progress Notes (Signed)
BP (!) 164/89   Pulse 77   Ht _0  (1.854 m)   Wt 224 lb (101.6 kg)   SpO2 98%   BMI 29.55 kg/m    Subjective:   Patient ID: Craig Arnold, male    DOB: 11/29/1956, 65 y.o.   MRN: [id number]  HPI: Craig Arnold is a 65 y.o. male presenting on 06/30/2021 for Medical Management of Chronic Issues, Diabetes, Hypertension, and Weight Loss (About 20lbs since April 22)   HPI Type 2 diabetes mellitus Patient comes in today for recheck of his diabetes. Patient has been currently taking metformin and Januvia, A1c is 5.8. Patient is currently on an ACE inhibitor/ARB. Patient has not seen an ophthalmologist this year. Patient denies any issues with their feet. The symptom started onset as an adult hypertension and GERD ARE RELATED TO DM   Hypertension Patient is currently on olmesartan, and their blood pressure today is 164/89, he has not taken his medicines yet this morning.. Patient denies any lightheadedness or dizziness. Patient denies headaches, blurred vision, chest pains, shortness of breath, or weakness. Denies any side effects from medication and is content with current medication.   COPD Patient is coming in for COPD recheck today.  He is currently on albuterol and Symbicort and DuoNeb and Daliresp.  He has a mild chronic cough but denies any major coughing spells or wheezing spells.  He has 1nighttime symptoms per week and 1daytime symptoms per week currently.   GERD Patient is currently on Prozac.  She denies any major symptoms or abdominal pain or belching or burping. She denies any blood in her stool or lightheadedness or dizziness.   Patient complains of a he has been losing some weight.  He does have an extensive smoking history although he does not smoke currently.  He has at least a 45-pack-year history  Relevant past medical, surgical, family and social history reviewed and updated as indicated. Interim medical history since our last visit reviewed. Allergies and medications  reviewed and updated.  Review of Systems  Constitutional:  Positive for unexpected weight change. Negative for appetite change, chills and fever.  Respiratory:  Negative for shortness of breath and wheezing.   Cardiovascular:  Negative for chest pain and leg swelling.  Gastrointestinal:  Negative for abdominal pain.  Musculoskeletal:  Negative for back pain and gait problem.  Skin:  Negative for rash.  Neurological:  Negative for dizziness and light-headedness.  All other systems reviewed and are negative.  Per HPI unless specifically indicated above   Allergies as of 06/30/2021   No Known Allergies      Medication List        Accurate as of June 30, 2021 11:00 AM. If you have any questions, ask your nurse or doctor.          STOP taking these medications    metFORMIN 1000 MG tablet Commonly known as: GLUCOPHAGE Stopped by: Fransisca Kaufmann Dayanna Pryce, MD       TAKE these medications    Accu-Chek FastClix Lancets Misc Use to check blood sugars TID   Accu-Chek Guide test strip Generic drug: glucose blood CHECK BLOOD SUGAR 3 TIMES A DAY E11.69   Accu-Chek Guide w/Device Kit Check BS 3 times a day Dx E11.69   Accu-Chek Softclix Lancet Dev Kit CHECK BLOOD SUGARS 3 TIMES A DAY Dx E11.69   albuterol 108 (90 Base) MCG/ACT inhaler Commonly known as: VENTOLIN HFA TAKE 2 PUFFS BY MOUTH EVERY 4 HOURS AS NEEDED   budesonide-formoterol  160-4.5 MCG/ACT inhaler Commonly known as: Symbicort Inhale 2 puffs into the lungs 2 (two) times daily.   Daliresp 500 MCG Tabs tablet Generic drug: roflumilast Take 1 tablet (500 mcg total) by mouth daily.   ferrous gluconate 324 MG tablet Commonly known as: FERGON Take 1 tablet (324 mg total) by mouth daily with breakfast.   fluticasone 50 MCG/ACT nasal spray Commonly known as: FLONASE Place 1 spray into both nostrils 2 (two) times daily as needed for allergies or rhinitis.   ipratropium-albuterol 0.5-2.5 (3) MG/3ML Soln Commonly  known as: DuoNeb Take 3 mLs by nebulization every 6 (six) hours as needed.   loratadine 10 MG tablet Commonly known as: CLARITIN Take 10 mg by mouth daily as needed.   olmesartan 20 MG tablet Commonly known as: BENICAR Take 1 tablet (20 mg total) by mouth daily.   omeprazole 20 MG capsule Commonly known as: PRILOSEC Take 1 capsule (20 mg total) by mouth daily.   ondansetron 4 MG tablet Commonly known as: Zofran Take 1 tablet (4 mg total) by mouth daily as needed for nausea or vomiting.   pravastatin 40 MG tablet Commonly known as: PRAVACHOL Take 1 tablet (40 mg total) by mouth daily.   predniSONE 10 MG tablet Commonly known as: DELTASONE Take 1 tablet (10 mg total) by mouth daily with breakfast. What changed: Another medication with the same name was removed. Continue taking this medication, and follow the directions you see here. Changed by: Fransisca Kaufmann Tyce Delcid, MD   sitaGLIPtin 100 MG tablet Commonly known as: Januvia Take 1 tablet (100 mg total) by mouth daily.         Objective:   BP (!) 164/89   Pulse 77   Ht _0  (1.854 m)   Wt 224 lb (101.6 kg)   SpO2 98%   BMI 29.55 kg/m   Wt Readings from Last 3 Encounters:  06/30/21 224 lb (101.6 kg)  03/31/21 243 lb (110.2 kg)  12/19/20 244 lb (110.7 kg)    Physical Exam Vitals and nursing note reviewed.  Constitutional:      General: He is not in acute distress.    Appearance: He is well-developed. He is not diaphoretic.  Eyes:     General: No scleral icterus.    Conjunctiva/sclera: Conjunctivae normal.  Neck:     Thyroid: No thyromegaly.  Cardiovascular:     Rate and Rhythm: Normal rate and regular rhythm.     Heart sounds: Normal heart sounds. No murmur heard. Pulmonary:     Effort: Pulmonary effort is normal. No respiratory distress.     Breath sounds: Normal breath sounds. No wheezing.  Musculoskeletal:        General: Normal range of motion.     Cervical back: Neck supple.  Lymphadenopathy:      Cervical: No cervical adenopathy.  Skin:    General: Skin is warm and dry.     Findings: No rash.  Neurological:     Mental Status: He is alert and oriented to person, place, and time.     Coordination: Coordination normal.  Psychiatric:        Behavior: Behavior normal.      Assessment & Plan:   Problem List Items Addressed This Visit       Cardiovascular and Mediastinum   Hypertension associated with diabetes ([place])   Relevant Medications   olmesartan (BENICAR) 20 MG tablet   pravastatin (PRAVACHOL) 40 MG tablet   sitaGLIPtin (JANUVIA) 100 MG tablet  Respiratory   COPD (chronic obstructive pulmonary disease) ([facility])     Digestive   GERD   Relevant Medications   omeprazole (PRILOSEC) 20 MG capsule     Endocrine   Type 2 diabetes mellitus with other specified complication ([facility]) - Primary   Relevant Medications   olmesartan (BENICAR) 20 MG tablet   pravastatin (PRAVACHOL) 40 MG tablet   sitaGLIPtin (JANUVIA) 100 MG tablet   Other Relevant Orders   Bayer DCA Hb A1c Waived    A1c is 5.8, will stop metformin. Follow up plan: Return in about 3 months (around 09/30/2021), or if symptoms worsen or fail to improve, for Diabetes hypertension.  Counseling provided for all of the vaccine components Orders Placed This Encounter  Procedures   Bayer [place] Hb A1c [place] Zowie Lundahl, MD [facility] 06/30/2021, 11:00 AM

## 2021-06-30 NOTE — Addendum Note (Signed)
Addended by: Caryl Pina on: 06/30/2021 11:05 AM   Modules accepted: Orders

## 2021-07-03 ENCOUNTER — Telehealth: Payer: Self-pay

## 2021-07-03 ENCOUNTER — Telehealth: Payer: Self-pay | Admitting: Family Medicine

## 2021-07-03 NOTE — Telephone Encounter (Signed)
Pt is thinking about switching to [facility] for his pharmacy. He would like for [place] to contact them on pricing.  He provided number [phone]  Januvia- $ 463.00 1 month supply [facility] is $102  Pravastatin- $ 0  Omeprazole- Not covered/Plan exclusion  Flonase- Not covered/Plan exclusion  Ferrous Gluconate-  Not covered/Plan exclusion  These medications are prescribed by [facility].

## 2021-07-03 NOTE — Telephone Encounter (Signed)
Please review xray results from 07/11

## 2021-07-03 NOTE — Telephone Encounter (Signed)
Xray has not been reviewed by Det[facility]er at this time. Will forward to [facility] to make aware.

## 2021-07-04 NOTE — Telephone Encounter (Signed)
Pt made aware. He will not make any changes in his pharmacy at this time.  Will call back if needed.

## 2021-08-25 ENCOUNTER — Other Ambulatory Visit: Payer: Self-pay | Admitting: Emergency Medicine

## 2021-09-01 ENCOUNTER — Telehealth: Payer: Self-pay | Admitting: Emergency Medicine

## 2021-09-01 NOTE — Telephone Encounter (Signed)
ATC, left VM. 

## 2021-09-02 NOTE — Telephone Encounter (Signed)
Called patient but he did not answer. Left message for him to call back.  

## 2021-09-04 NOTE — Telephone Encounter (Signed)
Patient is returning phone call. Patient phone number is [phone]. May leave detailed message on voicemail.

## 2021-09-04 NOTE — Telephone Encounter (Signed)
Left message for patient to call back  

## 2021-09-04 NOTE — Telephone Encounter (Signed)
ATC, left VM. 

## 2021-09-08 MED ORDER — DALIRESP 500 MCG PO TABS: 500.0000 ug | ORAL_TABLET | Freq: Every day | ORAL | 0 refills | Status: DC

## 2021-09-08 NOTE — Telephone Encounter (Signed)
Yes that sounds fine. Thanks

## 2021-09-08 NOTE — Telephone Encounter (Signed)
Spoke with the pt  He is asking for refill on daliresp  I refilled x 1 only (pending appt) He is scheduled as a televisit with Dr Lamonte Sakai for 10/02/21  He is fearful of coming in person due to ongoing pandemic  He states not able to do video visit  Dr Lamonte Sakai, please advise if you are okay with this next ov 10/13 being telephone, thanks!  Note to triage- when we call pt back with response, need to leave detailed msg if he does not answer

## 2021-09-08 NOTE — Telephone Encounter (Signed)
I have called the pt and left a detailed message on VM to make him aware that appt for 10/13 is for the televisit.

## 2021-10-01 ENCOUNTER — Encounter: Payer: Self-pay | Admitting: Family Medicine

## 2021-10-01 ENCOUNTER — Other Ambulatory Visit: Payer: Self-pay

## 2021-10-01 ENCOUNTER — Ambulatory Visit (INDEPENDENT_AMBULATORY_CARE_PROVIDER_SITE_OTHER): Payer: Medicare Other | Admitting: Family Medicine

## 2021-10-01 VITALS — BP 165/74 | HR 76 | Ht 73.0 in | Wt 227.0 lb

## 2021-10-01 DIAGNOSIS — J449 Chronic obstructive pulmonary disease, unspecified: Secondary | ICD-10-CM | POA: Diagnosis not present

## 2021-10-01 DIAGNOSIS — I152 Hypertension secondary to endocrine disorders: Secondary | ICD-10-CM

## 2021-10-01 DIAGNOSIS — Z1211 Encounter for screening for malignant neoplasm of colon: Secondary | ICD-10-CM

## 2021-10-01 DIAGNOSIS — K219 Gastro-esophageal reflux disease without esophagitis: Secondary | ICD-10-CM

## 2021-10-01 DIAGNOSIS — E1159 Type 2 diabetes mellitus with other circulatory complications: Secondary | ICD-10-CM | POA: Diagnosis not present

## 2021-10-01 DIAGNOSIS — E1169 Type 2 diabetes mellitus with other specified complication: Secondary | ICD-10-CM | POA: Diagnosis not present

## 2021-10-01 LAB — CBC WITH DIFFERENTIAL/PLATELET
Basophils Absolute: 0.1 10*3/uL (ref 0.0–0.2)
Basos: 1 %
EOS (ABSOLUTE): 0.3 10*3/uL (ref 0.0–0.4)
Eos: 4 %
Hematocrit: 37.9 % (ref 37.5–51.0)
Hemoglobin: 13.1 g/dL (ref 13.0–17.7)
Immature Grans (Abs): 0.1 10*3/uL (ref 0.0–0.1)
Immature Granulocytes: 1 %
Lymphocytes Absolute: 2.3 10*3/uL (ref 0.7–3.1)
Lymphs: 30 %
MCH: 29 pg (ref 26.6–33.0)
MCHC: 34.6 g/dL (ref 31.5–35.7)
MCV: 84 fL (ref 79–97)
Monocytes Absolute: 0.9 10*3/uL (ref 0.1–0.9)
Monocytes: 12 %
Neutrophils Absolute: 4 10*3/uL (ref 1.4–7.0)
Neutrophils: 52 %
Platelets: 188 10*3/uL (ref 150–450)
RBC: 4.52 x10E6/uL (ref 4.14–5.80)
RDW: 14.6 % (ref 11.6–15.4)
WBC: 7.7 10*3/uL (ref 3.4–10.8)

## 2021-10-01 LAB — LIPID PANEL
Chol/HDL Ratio: 2.8 ratio (ref 0.0–5.0)
Cholesterol, Total: 152 mg/dL (ref 100–199)
HDL: 54 mg/dL (ref >39)
LDL Chol Calc (NIH): 75 mg/dL (ref 0–99)
Triglycerides: 130 mg/dL (ref 0–149)
VLDL Cholesterol Cal: 23 mg/dL (ref 5–40)

## 2021-10-01 LAB — CMP14+EGFR
ALT: 12 IU/L (ref 0–44)
AST: 11 IU/L (ref 0–40)
Albumin/Globulin Ratio: 2 (ref 1.2–2.2)
Albumin: 4.4 g/dL (ref 3.8–4.8)
Alkaline Phosphatase: 61 IU/L (ref 44–121)
BUN/Creatinine Ratio: 11 (ref 10–24)
BUN: 10 mg/dL (ref 8–27)
Bilirubin Total: 0.6 mg/dL (ref 0.0–1.2)
CO2: 21 mmol/L (ref 20–29)
Calcium: 9.3 mg/dL (ref 8.6–10.2)
Chloride: 104 mmol/L (ref 96–106)
Creatinine, Ser: 0.88 mg/dL (ref 0.76–1.27)
Globulin, Total: 2.2 g/dL (ref 1.5–4.5)
Glucose: 115 mg/dL — ABNORMAL HIGH (ref 70–99)
Potassium: 3.9 mmol/L (ref 3.5–5.2)
Sodium: 141 mmol/L (ref 134–144)
Total Protein: 6.6 g/dL (ref 6.0–8.5)
eGFR: 96 mL/min/{1.73_m2} (ref >59)

## 2021-10-01 LAB — BAYER DCA HB A1C WAIVED: HB A1C (BAYER DCA - WAIVED): 6.3 % — ABNORMAL HIGH (ref 4.8–5.6)

## 2021-10-01 NOTE — Progress Notes (Signed)
BP (!) 165/74   Pulse 76   Ht _0  (1.854 m)   Wt 227 lb (103 kg)   SpO2 98%   BMI 29.95 kg/m    Subjective:   Patient ID: Craig Arnold, male    DOB: 1956-05-17, 65 y.o.   MRN: [id number]  HPI: Craig Arnold is a 65 y.o. male presenting on 10/01/2021 for Medical Management of Chronic Issues and Diabetes   HPI Type 2 diabetes mellitus Patient comes in today for recheck of his diabetes. Patient has been currently taking Januvia. Patient is currently on an ACE inhibitor/ARB. Patient has not seen an ophthalmologist this year. Patient denies any issues with their feet. The symptom started onset as an adult hypertension ARE RELATED TO DM   Hypertension Patient is currently on olmesartan, and their blood pressure today is 165/74, he admits he did not take his medicines this morning. Patient denies any lightheadedness or dizziness. Patient denies headaches, blurred vision, chest pains, shortness of breath, or weakness. Denies any side effects from medication and is content with current medication.   COPD Patient is coming in for COPD recheck today.  He is currently on Symbicort and Flonase and DuoNeb and Daliresp, sees pulmonology.  He has a mild chronic cough but denies any major coughing spells or wheezing spells.  He has 2nighttime symptoms per week and 5 daytime symptoms per week currently.   GERD Patient is currently on omeprazole.  She denies any major symptoms or abdominal pain or belching or burping. She denies any blood in her stool or lightheadedness or dizziness.   Relevant past medical, surgical, family and social history reviewed and updated as indicated. Interim medical history since our last visit reviewed. Allergies and medications reviewed and updated.  Review of Systems  Constitutional:  Negative for chills and fever.  Eyes:  Negative for visual disturbance.  Respiratory:  Negative for shortness of breath and wheezing.   Cardiovascular:  Negative for chest pain and leg  swelling.  Musculoskeletal:  Negative for back pain and gait problem.  Skin:  Negative for rash.  Neurological:  Negative for dizziness, weakness and light-headedness.  All other systems reviewed and are negative.  Per HPI unless specifically indicated above   Allergies as of 10/01/2021   No Known Allergies      Medication List        Accurate as of October 01, 2021  9:57 AM. If you have any questions, ask your nurse or doctor.          Accu-Chek FastClix Lancets Misc Use to check blood sugars TID   Accu-Chek Guide test strip Generic drug: glucose blood CHECK BLOOD SUGAR 3 TIMES A DAY E11.69   Accu-Chek Guide w/Device Kit Check BS 3 times a day Dx E11.69   Accu-Chek Softclix Lancet Dev Kit CHECK BLOOD SUGARS 3 TIMES A DAY Dx E11.69   albuterol 108 (90 Base) MCG/ACT inhaler Commonly known as: VENTOLIN HFA TAKE 2 PUFFS BY MOUTH EVERY 4 HOURS AS NEEDED   budesonide-formoterol 160-4.5 MCG/ACT inhaler Commonly known as: Symbicort Inhale 2 puffs into the lungs 2 (two) times daily.   Daliresp 500 MCG Tabs tablet Generic drug: roflumilast Take 1 tablet (500 mcg total) by mouth daily.   ferrous gluconate 324 MG tablet Commonly known as: FERGON Take 1 tablet (324 mg total) by mouth daily with breakfast.   fluticasone 50 MCG/ACT nasal spray Commonly known as: FLONASE Place 1 spray into both nostrils 2 (two) times daily as needed for  allergies or rhinitis.   ipratropium-albuterol 0.5-2.5 (3) MG/3ML Soln Commonly known as: DuoNeb Take 3 mLs by nebulization every 6 (six) hours as needed.   loratadine 10 MG tablet Commonly known as: CLARITIN Take 10 mg by mouth daily as needed.   olmesartan 20 MG tablet Commonly known as: BENICAR Take 1 tablet (20 mg total) by mouth daily.   omeprazole 20 MG capsule Commonly known as: PRILOSEC Take 1 capsule (20 mg total) by mouth daily.   ondansetron 4 MG tablet Commonly known as: Zofran Take 1 tablet (4 mg total) by mouth  daily as needed for nausea or vomiting.   pravastatin 40 MG tablet Commonly known as: PRAVACHOL Take 1 tablet (40 mg total) by mouth daily.   predniSONE 10 MG tablet Commonly known as: DELTASONE Take 1 tablet (10 mg total) by mouth daily with breakfast.   sitaGLIPtin 100 MG tablet Commonly known as: Januvia Take 1 tablet (100 mg total) by mouth daily.         Objective:   BP (!) 165/74   Pulse 76   Ht _0  (1.854 m)   Wt 227 lb (103 kg)   SpO2 98%   BMI 29.95 kg/m   Wt Readings from Last 3 Encounters:  10/01/21 227 lb (103 kg)  06/30/21 224 lb (101.6 kg)  03/31/21 243 lb (110.2 kg)    Physical Exam Vitals and nursing note reviewed.  Constitutional:      General: He is not in acute distress.    Appearance: He is well-developed. He is not diaphoretic.  Eyes:     General: No scleral icterus.    Conjunctiva/sclera: Conjunctivae normal.  Neck:     Thyroid: No thyromegaly.  Cardiovascular:     Rate and Rhythm: Normal rate and regular rhythm.     Heart sounds: Normal heart sounds. No murmur heard. Pulmonary:     Effort: Pulmonary effort is normal. No respiratory distress.     Breath sounds: Normal breath sounds. No wheezing.  Musculoskeletal:        General: No swelling. Normal range of motion.     Cervical back: Neck supple.  Lymphadenopathy:     Cervical: No cervical adenopathy.  Skin:    General: Skin is warm and dry.     Findings: No rash.  Neurological:     Mental Status: He is alert and oriented to person, place, and time.     Coordination: Coordination normal.  Psychiatric:        Behavior: Behavior normal.      Assessment & Plan:   Problem List Items Addressed This Visit       Cardiovascular and Mediastinum   Hypertension associated with diabetes ([place])   Relevant Orders   CBC with Differential/Platelet   CMP14+EGFR   Lipid panel   Bayer DCA Hb A1c Waived     Respiratory   COPD (chronic obstructive pulmonary disease) (HCC)      Digestive   GERD     Endocrine   Type 2 diabetes mellitus with other specified complication ([place]) - Primary   Relevant Orders   CBC with Differential/Platelet   CMP14+EGFR   Lipid panel   Bayer DCA Hb A1c Waived   Other Visit Diagnoses     Colon cancer screening       Relevant Orders   Cologuard     A1c up slightly but still looks good at 6.3.  Continue with current diet and lifestyle modification.  Blood pressure elevated but did  not take his medicine this morning, he will check it over the next week and call me back with some numbers.  Follow up plan: Return in about 3 months (around 01/01/2022), or if symptoms worsen or fail to improve, for Diabetes and hypertension and COPD.  Counseling provided for all of the vaccine components Orders Placed This Encounter  Procedures   CBC with Differential/Platelet   CMP14+EGFR   Lipid panel   Bayer DCA Hb A1c Waived   Cologuard    Caryl Pina, MD [facility] 10/01/2021, 9:57 AM

## 2021-10-02 ENCOUNTER — Other Ambulatory Visit: Payer: Self-pay | Admitting: Family Medicine

## 2021-10-02 ENCOUNTER — Ambulatory Visit (INDEPENDENT_AMBULATORY_CARE_PROVIDER_SITE_OTHER): Payer: Medicare Other | Admitting: Emergency Medicine

## 2021-10-02 ENCOUNTER — Encounter: Payer: Self-pay | Admitting: Emergency Medicine

## 2021-10-02 DIAGNOSIS — J479 Bronchiectasis, uncomplicated: Secondary | ICD-10-CM | POA: Diagnosis not present

## 2021-10-02 DIAGNOSIS — J449 Chronic obstructive pulmonary disease, unspecified: Secondary | ICD-10-CM

## 2021-10-02 DIAGNOSIS — J301 Allergic rhinitis due to pollen: Secondary | ICD-10-CM

## 2021-10-02 MED ORDER — BUDESONIDE-FORMOTEROL FUMARATE 160-4.5 MCG/ACT IN AERO
2.0000 | INHALATION_SPRAY | Freq: Two times a day (BID) | RESPIRATORY_TRACT | 5 refills | Status: DC
Start: 2021-10-02 — End: 2022-10-26

## 2021-10-02 MED ORDER — ALBUTEROL SULFATE HFA 108 (90 BASE) MCG/ACT IN AERS: INHALATION_SPRAY | RESPIRATORY_TRACT | 2 refills | Status: DC

## 2021-10-02 MED ORDER — DALIRESP 500 MCG PO TABS: 500.0000 ug | ORAL_TABLET | Freq: Every day | ORAL | 0 refills | Status: DC

## 2021-10-02 MED ORDER — PREDNISONE 10 MG PO TABS
10.0000 mg | ORAL_TABLET | Freq: Every day | ORAL | 1 refills | Status: DC
Start: 2021-10-02 — End: 2022-04-06

## 2021-10-02 NOTE — Progress Notes (Signed)
Virtual Visit via Telephone Note  I connected with Craig Arnold on 10/02/21 at  4:00 PM EDT by telephone and verified that I am speaking with the correct person using two identifiers.  Location: Patient: Home Provider: Office   I discussed the limitations, risks, security and privacy concerns of performing an evaluation and management service by telephone and the availability of in person appointments. I also discussed with the patient that there may be a patient responsible charge related to this service. The patient expressed understanding and agreed to proceed.   History of Present Illness: 65 year old man who is well-known to me for his history of steroid-dependent COPD/asthma, chronic cough and upper airway instability in the setting of postnasal drip.  He also has a remote history of MAIC and associated bronchiectasis.  He does have more recent cultures that are negative.   Observations/Objective: He is still active and working on the farm, he paces himself.  His wife died since I've last talked to him, died of COVID He hears some intermittent wheeze, usually passes, but uses his albuterol if it lasts.  Remains on daliresp and Symbicort.  No flares since I've talked to him.   Assessment and Plan: COPD (chronic obstructive pulmonary disease) ([place]) He has severe COPD/asthma.  He is on chronic prednisone 10 mg daily, Daliresp, Symbicort.  He uses albuterol as needed. Doing well without any exacerbations. Plan to continue this regimen and follow in 6 months or prn.   ALLERGIC RHINITIS DUE TO POLLEN Continue loratadine as needed  Bronchiectasis without complication ([place]) With history of MAIC and micronodular disease.  His last CT imaging was done in 2011. He has been stable and we have not repeated imaging in years. Will continue to follow him symptomatically.    Follow Up Instructions: 6 months   I discussed the assessment and treatment plan with the patient. The patient was  provided an opportunity to ask questions and all were answered. The patient agreed with the plan and demonstrated an understanding of the instructions.   The patient was advised to call back or seek an in-person evaluation if the symptoms worsen or if the condition fails to improve as anticipated.  I provided 17 minutes of non-face-to-face time during this encounter.   Collene Gobble, MD

## 2021-10-02 NOTE — Assessment & Plan Note (Addendum)
With history of MAIC and micronodular disease.  His last CT imaging was done in 2011. He has been stable and we have not repeated imaging in years. Will continue to follow him symptomatically.

## 2021-10-02 NOTE — Assessment & Plan Note (Addendum)
He has severe COPD/asthma.  He is on chronic prednisone 10 mg daily, Daliresp, Symbicort.  He uses albuterol as needed. Doing well without any exacerbations. Plan to continue this regimen and follow in 6 months or prn.

## 2021-10-02 NOTE — Assessment & Plan Note (Signed)
Continue loratadine as needed

## 2021-10-11 LAB — COLOGUARD

## 2021-10-13 ENCOUNTER — Other Ambulatory Visit: Payer: Self-pay

## 2021-10-13 DIAGNOSIS — Z1211 Encounter for screening for malignant neoplasm of colon: Secondary | ICD-10-CM

## 2021-10-14 ENCOUNTER — Telehealth: Payer: Self-pay | Admitting: Family Medicine

## 2021-10-14 ENCOUNTER — Other Ambulatory Visit: Payer: Self-pay | Admitting: Family Medicine

## 2021-10-14 NOTE — Telephone Encounter (Signed)
Spoke with pt today. We had contacted him about Cologuard not being able to process sample due to too much stool. Order was placed yesterday. Pt made aware.

## 2021-10-14 NOTE — Telephone Encounter (Signed)
Pt returning call from our office, he is aware of cologuard sending a new kit. Unsure of what we were calling him about. No other notes in chart. Please call back.  

## 2021-10-14 NOTE — Telephone Encounter (Signed)
No other notes in the chart - please advise

## 2021-10-20 DIAGNOSIS — Z1211 Encounter for screening for malignant neoplasm of colon: Secondary | ICD-10-CM | POA: Diagnosis not present

## 2021-10-27 LAB — COLOGUARD: COLOGUARD: POSITIVE — AB

## 2021-10-28 NOTE — Progress Notes (Signed)
Pt r/c about cologuard results

## 2021-10-30 ENCOUNTER — Encounter: Payer: Self-pay | Admitting: *Deleted

## 2021-10-30 ENCOUNTER — Telehealth: Payer: Self-pay | Admitting: Family Medicine

## 2021-11-04 ENCOUNTER — Telehealth: Payer: Self-pay | Admitting: Family Medicine

## 2021-11-04 DIAGNOSIS — R195 Other fecal abnormalities: Secondary | ICD-10-CM

## 2021-11-04 NOTE — Telephone Encounter (Signed)
REFERRAL REQUEST Telephone Note  Have you been seen at our office for this problem? yes (Advise that they may need an appointment with their PCP before a referral can be done)  Reason for Referral: colonoscopy  Referral discussed with patient:  'Patient has positive Cologuard and will need referral for colonoscopy, please ask him where he would like the go and place the referral for him.  Diagnosis positive Cologuard   Best contact number of patient for referral team: [phone] or [phone]    Has patient been seen by a specialist for this issue before: no  Patient provider preference for referral: [facility] Patient location preference for referral: [facility]   Patient notified that referrals can take up to a week or longer to process. If they haven't heard anything within a week they should call back and speak with the referral department.

## 2021-11-07 ENCOUNTER — Other Ambulatory Visit: Payer: Self-pay

## 2021-11-07 DIAGNOSIS — R195 Other fecal abnormalities: Secondary | ICD-10-CM

## 2021-11-07 NOTE — Telephone Encounter (Signed)
Pt is checking on referral to gi dr.

## 2021-11-10 NOTE — Telephone Encounter (Signed)
Placed referral to GI

## 2021-11-11 ENCOUNTER — Encounter: Payer: Self-pay | Admitting: Internal Medicine

## 2021-11-19 ENCOUNTER — Telehealth: Payer: Self-pay | Admitting: Family Medicine

## 2021-11-19 DIAGNOSIS — R195 Other fecal abnormalities: Secondary | ICD-10-CM

## 2021-11-27 NOTE — Telephone Encounter (Signed)
Placed new referral.

## 2021-12-01 ENCOUNTER — Other Ambulatory Visit: Payer: Self-pay | Admitting: Emergency Medicine

## 2021-12-03 DIAGNOSIS — R195 Other fecal abnormalities: Secondary | ICD-10-CM | POA: Diagnosis not present

## 2021-12-03 DIAGNOSIS — K649 Unspecified hemorrhoids: Secondary | ICD-10-CM | POA: Diagnosis not present

## 2021-12-03 DIAGNOSIS — E1159 Type 2 diabetes mellitus with other circulatory complications: Secondary | ICD-10-CM | POA: Diagnosis not present

## 2021-12-03 DIAGNOSIS — K219 Gastro-esophageal reflux disease without esophagitis: Secondary | ICD-10-CM | POA: Diagnosis not present

## 2021-12-03 DIAGNOSIS — E1169 Type 2 diabetes mellitus with other specified complication: Secondary | ICD-10-CM | POA: Diagnosis not present

## 2021-12-03 DIAGNOSIS — I152 Hypertension secondary to endocrine disorders: Secondary | ICD-10-CM | POA: Diagnosis not present

## 2021-12-03 DIAGNOSIS — K625 Hemorrhage of anus and rectum: Secondary | ICD-10-CM | POA: Diagnosis not present

## 2021-12-03 DIAGNOSIS — J449 Chronic obstructive pulmonary disease, unspecified: Secondary | ICD-10-CM | POA: Diagnosis not present

## 2021-12-04 LAB — HM DIABETES EYE EXAM

## 2021-12-17 ENCOUNTER — Telehealth: Payer: Self-pay | Admitting: Family Medicine

## 2021-12-23 ENCOUNTER — Telehealth: Payer: Self-pay | Admitting: Emergency Medicine

## 2021-12-23 MED ORDER — ROFLUMILAST 500 MCG PO TABS: 500.0000 ug | ORAL_TABLET | Freq: Every day | ORAL | 3 refills | Status: DC

## 2021-12-23 NOTE — Telephone Encounter (Signed)
Rx for pt's Daliresp has been sent to preferred pharmacy for pt. Called and spoke with pt letting him know this had been done and he verbalized understanding. Nothing further needed.

## 2022-01-01 ENCOUNTER — Encounter: Payer: Self-pay | Admitting: Family Medicine

## 2022-01-01 ENCOUNTER — Ambulatory Visit (INDEPENDENT_AMBULATORY_CARE_PROVIDER_SITE_OTHER): Payer: Medicare Other | Admitting: Family Medicine

## 2022-01-01 VITALS — BP 127/70 | HR 74 | Ht 73.0 in | Wt 227.0 lb

## 2022-01-01 DIAGNOSIS — I152 Hypertension secondary to endocrine disorders: Secondary | ICD-10-CM

## 2022-01-01 DIAGNOSIS — E1169 Type 2 diabetes mellitus with other specified complication: Secondary | ICD-10-CM | POA: Diagnosis not present

## 2022-01-01 DIAGNOSIS — E1159 Type 2 diabetes mellitus with other circulatory complications: Secondary | ICD-10-CM

## 2022-01-01 DIAGNOSIS — J449 Chronic obstructive pulmonary disease, unspecified: Secondary | ICD-10-CM | POA: Diagnosis not present

## 2022-01-01 LAB — BAYER DCA HB A1C WAIVED: HB A1C (BAYER DCA - WAIVED): 6.3 % — ABNORMAL HIGH (ref 4.8–5.6)

## 2022-01-01 NOTE — Progress Notes (Signed)
BP 127/70    Pulse 74    Ht 6' 1"  (1.854 m)    Wt 227 lb (103 kg)    SpO2 100%    BMI 29.95 kg/m    Subjective:   Patient ID: Craig Arnold, male    DOB: 07/05/1956, 66 y.o.   MRN: [id number]  HPI: Craig Arnold is a 66 y.o. male presenting on 01/01/2022 for Medical Management of Chronic Issues and Diabetes   HPI Type 2 diabetes mellitus Patient comes in today for recheck of his diabetes. Patient has been currently taking Januvia, A1c 6.3. Patient is currently on an ACE inhibitor/ARB. Patient has seen an ophthalmologist this year. Patient denies any issues with their feet. The symptom started onset as an adult hypertension ARE RELATED TO DM   Hypertension Patient is currently on olmesartan, and their blood pressure today is 153/82 and 127/70. Patient denies any lightheadedness or dizziness. Patient denies headaches, blurred vision, chest pains, shortness of breath, or weakness. Denies any side effects from medication and is content with current medication.   COPD Patient is coming in for COPD recheck today.  He is currently on Daliresp and albuterol, does admit that he is not taking his Symbicort because he lost it.  He has a mild chronic cough but denies any major coughing spells or wheezing spells.  He has 5nighttime symptoms per week and 7daytime symptoms per week currently.  He is having wheezing spells almost every day right now since has been off his Symbicort.  Patient has positive Cologuard and has an appointment for the 24th of this month for colonoscopy.  Relevant past medical, surgical, family and social history reviewed and updated as indicated. Interim medical history since our last visit reviewed. Allergies and medications reviewed and updated.  Review of Systems  Constitutional:  Negative for chills and fever.  Eyes:  Negative for visual disturbance.  Respiratory:  Negative for shortness of breath and wheezing.   Cardiovascular:  Negative for chest pain and leg swelling.   Musculoskeletal:  Negative for back pain and gait problem.  Skin:  Negative for rash.  Neurological:  Negative for dizziness, weakness and light-headedness.  All other systems reviewed and are negative.  Per HPI unless specifically indicated above   Allergies as of 01/01/2022   No Known Allergies      Medication List        Accurate as of January 01, 2022 10:43 AM. If you have any questions, ask your nurse or doctor.          Accu-Chek Guide test strip Generic drug: glucose blood CHECK BLOOD SUGAR 3 TIMES A DAY E11.69   Accu-Chek Guide w/Device Kit Check BS 3 times a day Dx E11.69   Accu-Chek Softclix Lancet Dev Kit CHECK BLOOD SUGARS 3 TIMES A DAY Dx E11.69   Accu-Chek Softclix Lancets lancets CHECK BLOOD SUGAR 3 TIMES DAILY Dx E11.69   albuterol 108 (90 Base) MCG/ACT inhaler Commonly known as: VENTOLIN HFA TAKE 2 PUFFS BY MOUTH EVERY 4 HOURS AS NEEDED   budesonide-formoterol 160-4.5 MCG/ACT inhaler Commonly known as: Symbicort Inhale 2 puffs into the lungs 2 (two) times daily.   ferrous gluconate 324 MG tablet Commonly known as: FERGON Take 1 tablet (324 mg total) by mouth daily with breakfast.   fluticasone 50 MCG/ACT nasal spray Commonly known as: FLONASE Place 1 spray into both nostrils 2 (two) times daily as needed for allergies or rhinitis.   ipratropium-albuterol 0.5-2.5 (3) MG/3ML Soln Commonly known as: DuoNeb  Take 3 mLs by nebulization every 6 (six) hours as needed.   loratadine 10 MG tablet Commonly known as: CLARITIN Take 10 mg by mouth daily as needed.   olmesartan 20 MG tablet Commonly known as: BENICAR Take 1 tablet (20 mg total) by mouth daily.   omeprazole 20 MG capsule Commonly known as: PRILOSEC Take 1 capsule (20 mg total) by mouth daily.   pravastatin 40 MG tablet Commonly known as: PRAVACHOL Take 1 tablet (40 mg total) by mouth daily.   predniSONE 10 MG tablet Commonly known as: DELTASONE Take 1 tablet (10 mg total) by  mouth daily with breakfast.   roflumilast 500 MCG Tabs tablet Commonly known as: Daliresp Take 1 tablet (500 mcg total) by mouth daily.   sitaGLIPtin 100 MG tablet Commonly known as: Januvia Take 1 tablet (100 mg total) by mouth daily.         Objective:   BP 127/70    Pulse 74    Ht 6' 1"  (1.854 m)    Wt 227 lb (103 kg)    SpO2 100%    BMI 29.95 kg/m   Wt Readings from Last 3 Encounters:  01/01/22 227 lb (103 kg)  10/01/21 227 lb (103 kg)  06/30/21 224 lb (101.6 kg)    Physical Exam Vitals and nursing note reviewed.  Constitutional:      General: He is not in acute distress.    Appearance: He is well-developed. He is not diaphoretic.  Eyes:     General: No scleral icterus.    Conjunctiva/sclera: Conjunctivae normal.  Neck:     Thyroid: No thyromegaly.  Cardiovascular:     Rate and Rhythm: Normal rate and regular rhythm.     Heart sounds: Normal heart sounds. No murmur heard. Pulmonary:     Effort: Pulmonary effort is normal. No respiratory distress.     Breath sounds: Wheezing present. No rhonchi or rales.  Chest:     Chest wall: No tenderness.  Musculoskeletal:        General: Normal range of motion.     Cervical back: Neck supple.  Lymphadenopathy:     Cervical: No cervical adenopathy.  Skin:    General: Skin is warm and dry.     Findings: No rash.  Neurological:     Mental Status: He is alert and oriented to person, place, and time.     Coordination: Coordination normal.  Psychiatric:        Behavior: Behavior normal.      Assessment & Plan:   Problem List Items Addressed This Visit       Cardiovascular and Mediastinum   Hypertension associated with diabetes ([place])     Respiratory   COPD (chronic obstructive pulmonary disease) ([place])     Endocrine   Type 2 diabetes mellitus with other specified complication ([place]) - Primary   Relevant Orders   Bayer DCA Hb A1c Waived    A1c looks good, continue current medicine, blood pressure looks good.   Recommended that he restart his Symbicort because of his wheezing. Follow up plan: Return in about 3 months (around 04/01/2022), or if symptoms worsen or fail to improve, for Diabetes and hypertension and COPD.  Counseling provided for all of the vaccine components Orders Placed This Encounter  Procedures   Bayer [place] Hb A1c [place] Maryruth Apple, MD [facility] 01/01/2022, 10:43 AM

## 2022-01-14 DIAGNOSIS — J439 Emphysema, unspecified: Secondary | ICD-10-CM | POA: Diagnosis not present

## 2022-01-14 DIAGNOSIS — D124 Benign neoplasm of descending colon: Secondary | ICD-10-CM | POA: Diagnosis not present

## 2022-01-14 DIAGNOSIS — Z1211 Encounter for screening for malignant neoplasm of colon: Secondary | ICD-10-CM | POA: Diagnosis not present

## 2022-01-14 DIAGNOSIS — D125 Benign neoplasm of sigmoid colon: Secondary | ICD-10-CM | POA: Diagnosis not present

## 2022-01-14 DIAGNOSIS — D123 Benign neoplasm of transverse colon: Secondary | ICD-10-CM | POA: Diagnosis not present

## 2022-01-14 DIAGNOSIS — D122 Benign neoplasm of ascending colon: Secondary | ICD-10-CM | POA: Diagnosis not present

## 2022-01-14 DIAGNOSIS — D120 Benign neoplasm of cecum: Secondary | ICD-10-CM | POA: Diagnosis not present

## 2022-01-14 DIAGNOSIS — R195 Other fecal abnormalities: Secondary | ICD-10-CM | POA: Diagnosis not present

## 2022-01-21 ENCOUNTER — Ambulatory Visit (INDEPENDENT_AMBULATORY_CARE_PROVIDER_SITE_OTHER): Payer: Medicare Other

## 2022-01-21 VITALS — Wt 235.0 lb

## 2022-01-21 DIAGNOSIS — Z0000 Encounter for general adult medical examination without abnormal findings: Secondary | ICD-10-CM | POA: Diagnosis not present

## 2022-01-21 NOTE — Progress Notes (Signed)
Subjective:   Craig Arnold is a 66 y.o. male who presents for an Initial Medicare Annual Wellness Visit.  Virtual Visit via Telephone Note  I connected with  Craig Arnold on 01/21/22 at  9:45 AM EST by telephone and verified that I am speaking with the correct person using two identifiers.  Location: Patient: Home Provider: [facility] Persons participating in the virtual visit: patient/Nurse Health Advisor   I discussed the limitations, risks, security and privacy concerns of performing an evaluation and management service by telephone and the availability of in person appointments. The patient expressed understanding and agreed to proceed.  Interactive audio and video telecommunications were attempted between this nurse and patient, however failed, due to patient having technical difficulties OR patient did not have access to video capability.  We continued and completed visit with audio only.  Some vital signs may be absent or patient reported.   Leahanna Buser E Christiaan Strebeck, LPN   Review of Systems     Cardiac Risk Factors include: advanced age (>63mn, >>41women);diabetes mellitus;hypertension;male gender;obesity (BMI >30kg/m2);Other (see comment), Risk factor comments: Anemia     Objective:    Today's Vitals   01/21/22 0941  Weight: 235 lb (106.6 kg)   Body mass index is 31 kg/m.  Advanced Directives 01/21/2022  Does Patient Have a Medical Advance Directive? No  Would patient like information on creating a medical advance directive? No - Patient declined    Current Medications (verified) Outpatient Encounter Medications as of 01/21/2022  Medication Sig   Accu-Chek Softclix Lancets lancets CHECK BLOOD SUGAR 3 TIMES DAILY Dx E11.69   albuterol (VENTOLIN HFA) 108 (90 Base) MCG/ACT inhaler TAKE 2 PUFFS BY MOUTH EVERY 4 HOURS AS NEEDED   Blood Glucose Monitoring Suppl (ACCU-CHEK GUIDE) w/Device KIT Check BS 3 times a day Dx E11.69   budesonide-formoterol (SYMBICORT) 160-4.5 MCG/ACT inhaler  Inhale 2 puffs into the lungs 2 (two) times daily.   glucose blood (ACCU-CHEK GUIDE) test strip CHECK BLOOD SUGAR 3 TIMES A DAY E11.69   Lancets Misc. (ACCU-CHEK SOFTCLIX LANCET DEV) KIT CHECK BLOOD SUGARS 3 TIMES A DAY Dx E11.69   metFORMIN (GLUCOPHAGE) 1000 MG tablet Take 500 mg by mouth 2 (two) times daily with a meal.   olmesartan (BENICAR) 20 MG tablet Take 1 tablet (20 mg total) by mouth daily.   omeprazole (PRILOSEC) 20 MG capsule Take 1 capsule (20 mg total) by mouth daily.   pravastatin (PRAVACHOL) 40 MG tablet Take 1 tablet (40 mg total) by mouth daily.   predniSONE (DELTASONE) 10 MG tablet Take 1 tablet (10 mg total) by mouth daily with breakfast.   roflumilast (DALIRESP) 500 MCG TABS tablet Take 1 tablet (500 mcg total) by mouth daily.   sitaGLIPtin (JANUVIA) 100 MG tablet Take 1 tablet (100 mg total) by mouth daily.   ferrous gluconate (FERGON) 324 MG tablet Take 1 tablet (324 mg total) by mouth daily with breakfast. (Patient not taking: Reported on 01/21/2022)   fluticasone (FLONASE) 50 MCG/ACT nasal spray Place 1 spray into both nostrils 2 (two) times daily as needed for allergies or rhinitis. (Patient not taking: Reported on 01/21/2022)   loratadine (CLARITIN) 10 MG tablet Take 10 mg by mouth daily as needed. (Patient not taking: Reported on 01/21/2022)   [DISCONTINUED] ipratropium-albuterol (DUONEB) 0.5-2.5 (3) MG/3ML SOLN Take 3 mLs by nebulization every 6 (six) hours as needed. (Patient not taking: Reported on 01/21/2022)   No facility-administered encounter medications on file as of 01/21/2022.    Allergies (verified) Patient  has no known allergies.   History: Past Medical History:  Diagnosis Date   Allergic rhinitis    Asthma    Atypical mycobacterial infection    Bronchiectasis    COPD (chronic obstructive pulmonary disease) (HCC)    Cough    Diabetes mellitus without complication (HCC)    GERD (gastroesophageal reflux disease)    HTN (hypertension)    Pulmonary nodule     Past Surgical History:  Procedure Laterality Date   BASAL CELL CARCINOMA EXCISION     face   Family History  Problem Relation Age of Onset   Emphysema Father    COPD Father    Heart disease Father    Stroke Father    Heart attack Father    Heart disease Paternal Uncle    Heart disease Maternal Grandfather    Rheum arthritis Mother    Diabetes Mother    Diabetes Brother    Heart attack Brother    Diabetes Brother    Social History   Socioeconomic History   Marital status: Widowed    Spouse name: Not on file   Number of children: 2   Years of education: Not on file   Highest education level: Not on file  Occupational History   Occupation: landscaping    Comment: retired  Tobacco Use   Smoking status: Former    Packs/day: 1.50    Years: 30.00    Pack years: 45.00    Types: Cigarettes    Quit date: 07/21/2008    Years since quitting: 13.5   Smokeless tobacco: Never  Vaping Use   Vaping Use: Never used  Substance and Sexual Activity   Alcohol use: No   Drug use: No   Sexual activity: Never  Other Topics Concern   Not on file  Social History Narrative   Wife passed away 2020-01-12 from Covid + pneumonia   Children in [place] Strain: Low Risk    Difficulty of Paying Living Expenses: Not hard at all  Food Insecurity: No Food Insecurity   Worried About Charity fundraiser in the Last Year: Never true   Arboriculturist in the Last Year: Never true  Transportation Needs: No Transportation Needs   Lack of Transportation (Medical): No   Lack of Transportation (Non-Medical): No  Physical Activity: Sufficiently Active   Days of Exercise per Week: 7 days   Minutes of Exercise per Session: 30 min  Stress: No Stress Concern Present   Feeling of Stress : Not at all  Social Connections: Socially Isolated   Frequency of Communication with Friends and Family: More than three times a week   Frequency of  Social Gatherings with Friends and Family: More than three times a week   Attends Religious Services: Never   Marine scientist or Organizations: No   Attends Archivist Meetings: Never   Marital Status: Widowed    Tobacco Counseling Counseling given: Not Answered   Clinical Intake:  Pre-visit preparation completed: Yes  Pain : No/denies pain     BMI - recorded: 31 Nutritional Status: BMI > 30  Obese Nutritional Risks: None Diabetes: Yes CBG done?: No Did pt. bring in CBG monitor from home?: No  How often do you need to have someone help you when you read instructions, pamphlets, or other written materials from your doctor or pharmacy?: 1 - Never  Diabetic? Nutrition Risk Assessment:  Has  the patient had any N/V/D within the last 2 months?  No  Does the patient have any non-healing wounds?  No  Has the patient had any unintentional weight loss or weight gain?  No   Diabetes:  Is the patient diabetic?  Yes  If diabetic, was a CBG obtained today?  No  Did the patient bring in their glucometer from home?  No  How often do you monitor your CBG's? Prn, not daily.   Financial Strains and Diabetes Management:  Are you having any financial strains with the device, your supplies or your medication? No .  Does the patient want to be seen by Chronic Care Management for management of their diabetes?  No  Would the patient like to be referred to a Nutritionist or for Diabetic Management?  No   Diabetic Exams:  Diabetic Eye Exam: Completed 12/02/2021.  Diabetic Foot Exam: Completed 01/01/2022. Pt has been advised about the importance in completing this exam. Pt is scheduled for diabetic foot exam on next year.    Interpreter Needed?: No  Information entered by :: Hannan Hutmacher, LPN   Activities of Daily Living In your present state of health, do you have any difficulty performing the following activities: 01/21/2022  Hearing? N  Vision? N  Difficulty  concentrating or making decisions? N  Walking or climbing stairs? N  Dressing or bathing? N  Doing errands, shopping? N  Preparing Food and eating ? N  Using the Toilet? N  In the past six months, have you accidently leaked urine? N  Do you have problems with loss of bowel control? N  Managing your Medications? N  Managing your Finances? N  Housekeeping or managing your Housekeeping? N  Some recent data might be hidden    Patient Care Team: Dettinger, Fransisca Kaufmann, MD as PCP - General (Family Medicine)  Indicate any recent Medical Services you may have received from other than [facility] providers in the past year (date may be approximate).     Assessment:   This is a routine wellness examination for [place].  Hearing/Vision screen Hearing Screening - Comments:: Denies hearing difficulties  Vision Screening - Comments:: Wears rx glasses prn - up to date with annual eye exams with [facility]  Dietary issues and exercise activities discussed: Current Exercise Habits: Home exercise routine, Type of exercise: walking;Other - see comments (yard work, house work), Time (Minutes): 30, Frequency (Times/Week): 7, Weekly Exercise (Minutes/Week): 210, Intensity: Mild, Exercise limited by: respiratory conditions(s)   Goals Addressed             This Visit's Progress    Patient Stated       Stay healthy and active Increase water       Depression Screen PHQ 2/9 Scores 01/21/2022 01/01/2022 10/01/2021 06/30/2021 03/31/2021 12/19/2020 06/05/2020  PHQ - 2 Score 0 0 0 0 0 0 0    Fall Risk Fall Risk  01/21/2022 01/01/2022 10/01/2021 06/30/2021 03/31/2021  Falls in the past year? 0 0 0 0 0  Number falls in past yr: 0 - - - -  Injury with Fall? 0 - - - -  Risk for fall due to : No Fall Risks - - - -  Follow up Falls prevention discussed - - - -    FALL RISK PREVENTION PERTAINING TO THE HOME:  Any stairs in or around the home? Yes  If so, are there any without handrails? No  Home free of loose  throw rugs in walkways, pet beds,  electrical cords, etc? Yes  Adequate lighting in your home to reduce risk of falls? Yes   ASSISTIVE DEVICES UTILIZED TO PREVENT FALLS:  Life alert? No  Use of a cane, walker or w/c? No  Grab bars in the bathroom? No  Shower chair or bench in shower? No  Elevated toilet seat or a handicapped toilet? No   TIMED UP AND GO:  Was the test performed? No . Telephonic visit  Cognitive Function:     6CIT Screen 01/21/2022  What Year? 0 points  What month? 0 points  What time? 0 points  Count back from 20 0 points  Months in reverse 4 points  Repeat phrase 2 points  Total Score 6    Immunizations Immunization History  Administered Date(s) Administered   Td 08/16/2007    TDAP status: Due, Education has been provided regarding the importance of this vaccine. Advised may receive this vaccine at local pharmacy or Health Dept. Aware to provide a copy of the vaccination record if obtained from local pharmacy or Health Dept. Verbalized acceptance and understanding.  Flu Vaccine status: Declined, Education has been provided regarding the importance of this vaccine but patient still declined. Advised may receive this vaccine at local pharmacy or Health Dept. Aware to provide a copy of the vaccination record if obtained from local pharmacy or Health Dept. Verbalized acceptance and understanding.  Pneumococcal vaccine status: Declined,  Education has been provided regarding the importance of this vaccine but patient still declined. Advised may receive this vaccine at local pharmacy or Health Dept. Aware to provide a copy of the vaccination record if obtained from local pharmacy or Health Dept. Verbalized acceptance and understanding.   Covid-19 vaccine status: Declined, Education has been provided regarding the importance of this vaccine but patient still declined. Advised may receive this vaccine at local pharmacy or Health Dept.or vaccine clinic. Aware to provide  a copy of the vaccination record if obtained from local pharmacy or Health Dept. Verbalized acceptance and understanding.  Qualifies for Shingles Vaccine? Yes   Zostavax completed No   Shingrix Completed?: No.    Education has been provided regarding the importance of this vaccine. Patient has been advised to call insurance company to determine out of pocket expense if they have not yet received this vaccine. Advised may also receive vaccine at local pharmacy or Health Dept. Verbalized acceptance and understanding.  Screening Tests Health Maintenance  Topic Date Due   Pneumonia Vaccine 22+ Years old (1 - PCV) Never done   COLONOSCOPY (Pts 45-13yr Insurance coverage will need to be confirmed)  Never done   Zoster Vaccines- Shingrix (1 of 2) 01/04/2023 (Originally 01/08/2007)   HEMOGLOBIN A1C  07/01/2022   OPHTHALMOLOGY EXAM  12/02/2022   FOOT EXAM  01/01/2023   Hepatitis C Screening  Completed   HIV Screening  Completed   HPV VACCINES  Aged Out   INFLUENZA VACCINE  Discontinued   TETANUS/TDAP  Discontinued   COVID-19 Vaccine  Discontinued   Fecal DNA (Cologuard)  Discontinued    Health Maintenance  Health Maintenance Due  Topic Date Due   Pneumonia Vaccine 65 Years old (1 - PCV) Never done   COLONOSCOPY (Pts 45-497yrInsurance coverage will need to be confirmed)  Never done    Colorectal cancer screening: Type of screening: Colonoscopy. Completed 01/14/2022. Repeat every 1 years  Lung Cancer Screening: (Low Dose CT Chest recommended if Age 65-80ears, 30 pack-year currently smoking OR have quit w/in 15years.) does not qualify.  Additional Screening:  Hepatitis C Screening: does qualify; Completed 12/17/2017  Vision Screening: Recommended annual ophthalmology exams for early detection of glaucoma and other disorders of the eye. Is the patient up to date with their annual eye exam?  Yes  Who is the provider or what is the name of the office in which the patient attends annual  eye exams? [facility] If pt is not established with a provider, would they like to be referred to a provider to establish care? No .   Dental Screening: Recommended annual dental exams for proper oral hygiene  Community Resource Referral / Chronic Care Management: CRR required this visit?  No   CCM required this visit?  No      Plan:     I have personally reviewed and noted the following in the patients chart:   Medical and social history Use of alcohol, tobacco or illicit drugs  Current medications and supplements including opioid prescriptions. Patient is not currently taking opioid prescriptions. Functional ability and status Nutritional status Physical activity Advanced directives List of other physicians Hospitalizations, surgeries, and ER visits in previous 12 months Vitals Screenings to include cognitive, depression, and falls Referrals and appointments  In addition, I have reviewed and discussed with patient certain preventive protocols, quality metrics, and best practice recommendations. A written personalized care plan for preventive services as well as general preventive health recommendations were provided to patient.     Sandrea Hammond, LPN   08/25/3275   Nurse Notes: None

## 2022-01-21 NOTE — Patient Instructions (Signed)
Craig Arnold , Thank you for taking time to come for your Medicare Wellness Visit. I appreciate your ongoing commitment to your health goals. Please review the following plan we discussed and let me know if I can assist you in the future.   Screening recommendations/referrals: Colonoscopy: Done 01/14/2022 - repeat in 1 year Recommended yearly ophthalmology/optometry visit for glaucoma screening and checkup Recommended yearly dental visit for hygiene and checkup  Vaccinations: declines all vaccines at this time Influenza vaccine: Recommend every fall Pneumococcal vaccine: Recommend 2 vaccines 1 year apart Tdap vaccine: Done 08/16/2007 - Repeat in 10 years *past due Shingles vaccine: Recommend 2 doses 2 months apart   Covid-19: Recommended - see pharmacy if interested  Advanced directives: Advance directive discussed with you today. Even though you declined this today, please call our office should you change your mind, and we can give you the proper paperwork for you to fill out.   Conditions/risks identified: Aim for 30 minutes of exercise or brisk walking each day, drink 6-8 glasses of water and eat lots of fruits and vegetables.   Next appointment: Follow up in one year for your annual wellness visit.   Preventive Care 65 Years and Older, Male  Preventive care refers to lifestyle choices and visits with your health care provider that can promote health and wellness. What does preventive care include? A yearly physical exam. This is also called an annual well check. Dental exams once or twice a year. Routine eye exams. Ask your health care provider how often you should have your eyes checked. Personal lifestyle choices, including: Daily care of your teeth and gums. Regular physical activity. Eating a healthy diet. Avoiding tobacco and drug use. Limiting alcohol use. Practicing safe sex. Taking low doses of aspirin every day. Taking vitamin and mineral supplements as recommended by  your health care provider. What happens during an annual well check? The services and screenings done by your health care provider during your annual well check will depend on your age, overall health, lifestyle risk factors, and family history of disease. Counseling  Your health care provider may ask you questions about your: Alcohol use. Tobacco use. Drug use. Emotional well-being. Home and relationship well-being. Sexual activity. Eating habits. History of falls. Memory and ability to understand (cognition). Work and work Statistician. Screening  You may have the following tests or measurements: Height, weight, and BMI. Blood pressure. Lipid and cholesterol levels. These may be checked every 5 years, or more frequently if you are over 65 years old. Skin check. Lung cancer screening. You may have this screening every year starting at age 65 if you have a 30-pack-year history of smoking and currently smoke or have quit within the past 15 years. Fecal occult blood test (FOBT) of the stool. You may have this test every year starting at age 65. Flexible sigmoidoscopy or colonoscopy. You may have a sigmoidoscopy every 5 years or a colonoscopy every 10 years starting at age 65. Prostate cancer screening. Recommendations will vary depending on your family history and other risks. Hepatitis C blood test. Hepatitis B blood test. Sexually transmitted disease (STD) testing. Diabetes screening. This is done by checking your blood sugar (glucose) after you have not eaten for a while (fasting). You may have this done every 1-3 years. Abdominal aortic aneurysm (AAA) screening. You may need this if you are a current or former smoker. Osteoporosis. You may be screened starting at age 30 if you are at high risk. Talk with your health care  provider about your test results, treatment options, and if necessary, the need for more tests. Vaccines  Your health care provider may recommend certain vaccines,  such as: Influenza vaccine. This is recommended every year. Tetanus, diphtheria, and acellular pertussis (Tdap, Td) vaccine. You may need a Td booster every 10 years. Zoster vaccine. You may need this after age 65. Pneumococcal 13-valent conjugate (PCV13) vaccine. One dose is recommended after age 65. Pneumococcal polysaccharide (PPSV23) vaccine. One dose is recommended after age 65. Talk to your health care provider about which screenings and vaccines you need and how often you need them. This information is not intended to replace advice given to you by your health care provider. Make sure you discuss any questions you have with your health care provider. Document Released: 01/03/2016 Document Revised: 08/26/2016 Document Reviewed: 10/08/2015 Elsevier Interactive Patient Education  2017 [place] Prevention in the Home Falls can cause injuries. They can happen to people of all ages. There are many things you can do to make your home safe and to help prevent falls. What can I do on the outside of my home? Regularly fix the edges of walkways and driveways and fix any cracks. Remove anything that might make you trip as you walk through a door, such as a raised step or threshold. Trim any bushes or trees on the path to your home. Use bright outdoor lighting. Clear any walking paths of anything that might make someone trip, such as rocks or tools. Regularly check to see if handrails are loose or broken. Make sure that both sides of any steps have handrails. Any raised decks and porches should have guardrails on the edges. Have any leaves, snow, or ice cleared regularly. Use sand or salt on walking paths during winter. Clean up any spills in your garage right away. This includes oil or grease spills. What can I do in the bathroom? Use night lights. Install grab bars by the toilet and in the tub and shower. Do not use towel bars as grab bars. Use non-skid mats or decals in the tub or  shower. If you need to sit down in the shower, use a plastic, non-slip stool. Keep the floor dry. Clean up any water that spills on the floor as soon as it happens. Remove soap buildup in the tub or shower regularly. Attach bath mats securely with double-sided non-slip rug tape. Do not have throw rugs and other things on the floor that can make you trip. What can I do in the bedroom? Use night lights. Make sure that you have a light by your bed that is easy to reach. Do not use any sheets or blankets that are too big for your bed. They should not hang down onto the floor. Have a firm chair that has side arms. You can use this for support while you get dressed. Do not have throw rugs and other things on the floor that can make you trip. What can I do in the kitchen? Clean up any spills right away. Avoid walking on wet floors. Keep items that you use a lot in easy-to-reach places. If you need to reach something above you, use a strong step stool that has a grab bar. Keep electrical cords out of the way. Do not use floor polish or wax that makes floors slippery. If you must use wax, use non-skid floor wax. Do not have throw rugs and other things on the floor that can make you trip. What can I  do with my stairs? Do not leave any items on the stairs. Make sure that there are handrails on both sides of the stairs and use them. Fix handrails that are broken or loose. Make sure that handrails are as long as the stairways. Check any carpeting to make sure that it is firmly attached to the stairs. Fix any carpet that is loose or worn. Avoid having throw rugs at the top or bottom of the stairs. If you do have throw rugs, attach them to the floor with carpet tape. Make sure that you have a light switch at the top of the stairs and the bottom of the stairs. If you do not have them, ask someone to add them for you. What else can I do to help prevent falls? Wear shoes that: Do not have high heels. Have  rubber bottoms. Are comfortable and fit you well. Are closed at the toe. Do not wear sandals. If you use a stepladder: Make sure that it is fully opened. Do not climb a closed stepladder. Make sure that both sides of the stepladder are locked into place. Ask someone to hold it for you, if possible. Clearly mark and make sure that you can see: Any grab bars or handrails. First and last steps. Where the edge of each step is. Use tools that help you move around (mobility aids) if they are needed. These include: Canes. Walkers. Scooters. Crutches. Turn on the lights when you go into a dark area. Replace any light bulbs as soon as they burn out. Set up your furniture so you have a clear path. Avoid moving your furniture around. If any of your floors are uneven, fix them. If there are any pets around you, be aware of where they are. Review your medicines with your doctor. Some medicines can make you feel dizzy. This can increase your chance of falling. Ask your doctor what other things that you can do to help prevent falls. This information is not intended to replace advice given to you by your health care provider. Make sure you discuss any questions you have with your health care provider. Document Released: 10/03/2009 Document Revised: 05/14/2016 Document Reviewed: 01/11/2015 Elsevier Interactive Patient Education  2017 [employer].

## 2022-01-28 ENCOUNTER — Ambulatory Visit: Payer: Medicare Other | Admitting: Emergency Medicine

## 2022-02-12 ENCOUNTER — Ambulatory Visit: Payer: Medicare Other | Admitting: Emergency Medicine

## 2022-02-26 ENCOUNTER — Encounter: Payer: Self-pay | Admitting: Emergency Medicine

## 2022-02-26 ENCOUNTER — Other Ambulatory Visit: Payer: Self-pay

## 2022-02-26 ENCOUNTER — Ambulatory Visit: Payer: Medicare Other | Admitting: Emergency Medicine

## 2022-02-26 DIAGNOSIS — J449 Chronic obstructive pulmonary disease, unspecified: Secondary | ICD-10-CM

## 2022-02-26 NOTE — Progress Notes (Signed)
[name] is a 66 year old man with COPD, chronic cough with chronic postnasal drip, and bronchodilator responsiveness.  Also has documented [facility], completed  therapy (3/09 - 10/09). Follow up sputum negative. Repeat 12/09 showed AFB negative, fungal negative normal flora. Have considered him for Xolair but IgE and eosinophils low in the past.  ? ?ROV 11/22/18 --follow-up visit 66 year old gentleman with moderate to severe persistent asthma with associated COPD and bronchiectasis.  Managed on chronic prednisone 10 mg daily.  He is been treated for [facility], negative on most recent sputum.  Chest x-ray in April was reviewed, shows clear lungs with some biapical pleural thickening consistent with scar, no overt infiltrates. He believes that his breathing has been stable. Good and bad days. He has wheeze that can happen at random. Since last time he has come off lisinopril, added omeprazole. He is on symbicort bid, uses albuterol 3-7x a week. No flares or hospitalizations since last time.  ? ?ROV 02/26/22 --pleasant 66 year old gentleman with a history of COPD/asthma, remote MAIC with associated bronchiectasis, chronic cough due to this as well as some upper airway instability and rhinitis.  He has not had CT reimaging in several years because has been clinically stable.  The last several visits have been virtual visits.  He is here today in person.  Managing on prednisone 10 mg daily, Daliresp, Symbicort.  Uses albuterol approximately 0-2x a day.  ?Loratadine as needed, flonase prn.  ?[place], [place] w [place]. The weather impacts his breathing status. He hears some wheeze, not every day. Relieved by albuterol. He does have some wheeze at night when he lays down. No flares.  ? ? ? ?EXAM:  ?Vitals:  ? 02/26/22 0946  ?BP: (!) 144/76  ?Pulse: 81  ?Temp: 97.6 ?F (36.4 ?C)  ?TempSrc: Oral  ?SpO2: 99%  ?Weight: 239 lb 3.2 oz (108.5 kg)  ?Height: '6\' 1"'$  (1.854 m)  ? ? ?Gen: Pleasant, obese, in no distress,   normal affect, stutters ? ?ENT: No lesions,  mouth clear,  oropharynx clear, no postnasal drip ? ?Neck: No JVD, no Stridor ? ?Lungs: Mostly clear.  No wheezing.  He does have some mild wheeze at the end of expiration ? ?Cardiovascular: RRR, heart sounds normal, no murmur or gallops, no peripheral edema ? ?Musculoskeletal: No deformities, no cyanosis or clubbing ? ?Neuro: alert, non focal ? ?Skin: Warm, no lesions or rashes ? ? ?COPD (chronic obstructive pulmonary disease) (HCC) ?We spoke briefly today about possibly adding a LAMA to his maintenance regimen.  I did review potential side effects with him-he has been on Spiriva in the past and did seem to tolerate.  For now he wants to continue on the Symbicort alone.  Albuterol as needed.  Continue his other maintenance medications.  He does not get the flu or COVID vaccines.  Recommended that he would probably benefit from these. ? ?Please continue Symbicort 2 puffs twice a day.  Rinse and gargle after using. ?Keep your albuterol available to use 2 puffs when you needed for shortness of breath, chest tightness, wheezing. ?Continue prednisone 10 mg once daily. ?Continue Daliresp once daily ?Consider restarting your loratadine 10 mg once daily during the allergy season ?Consider restarting your fluticasone nasal spray 2 sprays each nostril once daily during the allergy season ?Consider getting the influenza and COVID vaccines going forward.  You would probably benefit from these. ?We will set up a virtual office visit in 6 months with Dr. Lamonte Sakai ?Follow  with Dr. Lamonte Sakai in off in 12 months or sooner if you have any problems.  ? ?Baltazar Apo, MD, PhD ?02/26/2022, 10:27 AM ?[facility] ?[phone] or if no answer [phone] ? ?

## 2022-02-26 NOTE — Patient Instructions (Addendum)
Please continue Symbicort 2 puffs twice a day.  Rinse and gargle after using. ?Keep your albuterol available to use 2 puffs when you needed for shortness of breath, chest tightness, wheezing. ?Continue prednisone 10 mg once daily. ?Continue Daliresp once daily ?Consider restarting your loratadine 10 mg once daily during the allergy season ?Consider restarting your fluticasone nasal spray 2 sprays each nostril once daily during the allergy season ?Consider getting the influenza and COVID vaccines going forward.  You would probably benefit from these. ?We will set up a virtual office visit in 6 months with Dr. Lamonte Sakai ?Follow with Dr. Lamonte Sakai in off in 12 months or sooner if you have any problems.  ? ?

## 2022-02-26 NOTE — Assessment & Plan Note (Signed)
We spoke briefly today about possibly adding a LAMA to his maintenance regimen.  I did review potential side effects with him-he has been on Spiriva in the past and did seem to tolerate.  For now he wants to continue on the Symbicort alone.  Albuterol as needed.  Continue his other maintenance medications.  He does not get the flu or COVID vaccines.  Recommended that he would probably benefit from these. ? ?Please continue Symbicort 2 puffs twice a day.  Rinse and gargle after using. ?Keep your albuterol available to use 2 puffs when you needed for shortness of breath, chest tightness, wheezing. ?Continue prednisone 10 mg once daily. ?Continue Daliresp once daily ?Consider restarting your loratadine 10 mg once daily during the allergy season ?Consider restarting your fluticasone nasal spray 2 sprays each nostril once daily during the allergy season ?Consider getting the influenza and COVID vaccines going forward.  You would probably benefit from these. ?We will set up a virtual office visit in 6 months with Dr. Lamonte Sakai ?Follow with Dr. Lamonte Sakai in off in 12 months or sooner if you have any problems.  ?

## 2022-03-20 ENCOUNTER — Ambulatory Visit: Payer: Medicare Other | Admitting: Gastroenterology

## 2022-03-24 ENCOUNTER — Telehealth: Payer: Self-pay | Admitting: Family Medicine

## 2022-03-24 NOTE — Telephone Encounter (Signed)
?  Prescription Request ? ?03/24/2022 ? ?Is this a "Controlled Substance" medicine? NO ? ?Have you seen your PCP in the last 2 weeks? No, pt has 3 month f/u appt 04/08/22 but he is about to run out  ? ?If YES, route message to pool  -  If NO, patient needs to be scheduled for appointment. ? ?What is the name of the medication or equipment? metFORMIN (GLUCOPHAGE) 1000 MG tablet  ? ?Have you contacted your pharmacy to request a refill? yes  ? ?Which pharmacy would you like this sent to? [facility]  ? ? ?Patient notified that their request is being sent to the clinical staff for review and that they should receive a response within 2 business days.  ? ? ?

## 2022-03-25 MED ORDER — METFORMIN HCL 500 MG PO TABS: 500.0000 mg | ORAL_TABLET | Freq: Two times a day (BID) | ORAL | 3 refills | Status: DC

## 2022-03-25 NOTE — Telephone Encounter (Signed)
Sent new prescription for metformin 500 twice daily ?

## 2022-04-04 ENCOUNTER — Other Ambulatory Visit: Payer: Self-pay | Admitting: Emergency Medicine

## 2022-04-05 ENCOUNTER — Other Ambulatory Visit: Payer: Self-pay | Admitting: Family Medicine

## 2022-04-08 ENCOUNTER — Encounter: Payer: Self-pay | Admitting: Family Medicine

## 2022-04-08 ENCOUNTER — Ambulatory Visit (INDEPENDENT_AMBULATORY_CARE_PROVIDER_SITE_OTHER): Payer: Medicare Other | Admitting: Family Medicine

## 2022-04-08 VITALS — BP 135/70 | HR 76 | Ht 73.0 in | Wt 237.0 lb

## 2022-04-08 DIAGNOSIS — Z125 Encounter for screening for malignant neoplasm of prostate: Secondary | ICD-10-CM | POA: Diagnosis not present

## 2022-04-08 DIAGNOSIS — I152 Hypertension secondary to endocrine disorders: Secondary | ICD-10-CM

## 2022-04-08 DIAGNOSIS — E1169 Type 2 diabetes mellitus with other specified complication: Secondary | ICD-10-CM

## 2022-04-08 DIAGNOSIS — R195 Other fecal abnormalities: Secondary | ICD-10-CM

## 2022-04-08 DIAGNOSIS — E1159 Type 2 diabetes mellitus with other circulatory complications: Secondary | ICD-10-CM

## 2022-04-08 DIAGNOSIS — J449 Chronic obstructive pulmonary disease, unspecified: Secondary | ICD-10-CM | POA: Diagnosis not present

## 2022-04-08 LAB — BAYER DCA HB A1C WAIVED: HB A1C (BAYER DCA - WAIVED): 7.2 % — ABNORMAL HIGH (ref 4.8–5.6)

## 2022-04-08 MED ORDER — FERROUS GLUCONATE 324 (38 FE) MG PO TABS: 324.0000 mg | ORAL_TABLET | Freq: Every day | ORAL | 3 refills | Status: DC

## 2022-04-08 NOTE — Progress Notes (Signed)
? ?BP 135/70   Pulse 76   Ht 6' 1"  (1.854 m)   Wt 237 lb (107.5 kg)   SpO2 99%   BMI 31.27 kg/m?   ? ?Subjective:  ? ?Patient ID: Craig Arnold, male    DOB: [birth year], 66 y.o.   MRN: [id number] ? ?HPI: ?Craig Arnold is a 66 y.o. male presenting on 04/08/2022 for Medical Management of Chronic Issues and Diabetes ? ? ?HPI ?Type 2 diabetes mellitus ?Patient comes in today for recheck of his diabetes. Patient has been currently taking metformin and Januvia. Patient is currently on an ACE inhibitor/ARB. Patient has not seen an ophthalmologist this year. Patient denies any issues with their feet. The symptom started onset as an adult hypertension ARE RELATED TO DM  ? ?Hypertension ?Patient is currently on olmesartan, and their blood pressure today is 135/70. Patient denies any lightheadedness or dizziness. Patient denies headaches, blurred vision, chest pains, shortness of breath, or weakness. Denies any side effects from medication and is content with current medication.  ? ?COPD ?Patient is coming in for COPD recheck today.  He is currently on Daliresp and Symbicort and albuterol.  He has a mild chronic cough but denies any major coughing spells or wheezing spells.  He has 1 nighttime symptoms per week and 2 daytime symptoms per week currently.  ? ?Relevant past medical, surgical, family and social history reviewed and updated as indicated. Interim medical history since our last visit reviewed. ?Allergies and medications reviewed and updated. ? ?Review of Systems  ?Constitutional:  Negative for chills and fever.  ?Eyes:  Negative for visual disturbance.  ?Respiratory:  Negative for shortness of breath and wheezing.   ?Cardiovascular:  Negative for chest pain and leg swelling.  ?Musculoskeletal:  Negative for back pain and gait problem.  ?Skin:  Negative for rash.  ?Neurological:  Negative for dizziness, weakness and light-headedness.  ?All other systems reviewed and are negative. ? ?Per HPI unless specifically  indicated above ? ? ?Allergies as of 04/08/2022   ?No Known Allergies ?  ? ?  ?Medication List  ?  ? ?  ? Accurate as of April 08, 2022 11:31 AM. If you have any questions, ask your nurse or doctor.  ?  ?  ? ?  ? ?Accu-Chek Guide test strip ?Generic drug: glucose blood ?CHECK BLOOD SUGAR 3 TIMES A DAY E11.69 ?  ?Accu-Chek Guide w/Device Kit ?Check BS 3 times a day Dx E11.69 ?  ?Accu-Chek Softclix Lancet Dev Kit ?CHECK BLOOD SUGARS 3 TIMES A DAY Dx E11.69 ?  ?Accu-Chek Softclix Lancets lancets ?CHECK BLOOD SUGAR 3 TIMES DAILY Dx E11.69 ?  ?albuterol 108 (90 Base) MCG/ACT inhaler ?Commonly known as: VENTOLIN HFA ?TAKE 2 PUFFS BY MOUTH EVERY 4 HOURS AS NEEDED ?  ?budesonide-formoterol 160-4.5 MCG/ACT inhaler ?Commonly known as: Symbicort ?Inhale 2 puffs into the lungs 2 (two) times daily. ?  ?ferrous gluconate 324 MG tablet ?Commonly known as: FERGON ?Take 1 tablet (324 mg total) by mouth daily with breakfast. ?  ?fluticasone 50 MCG/ACT nasal spray ?Commonly known as: FLONASE ?Place 1 spray into both nostrils 2 (two) times daily as needed for allergies or rhinitis. ?  ?loratadine 10 MG tablet ?Commonly known as: CLARITIN ?Take 10 mg by mouth daily as needed. ?  ?metFORMIN 500 MG tablet ?Commonly known as: GLUCOPHAGE ?Take 1 tablet (500 mg total) by mouth 2 (two) times daily with a meal. ?  ?olmesartan 20 MG tablet ?Commonly known as: BENICAR ?Take 1 tablet (20 mg total)  by mouth daily. ?  ?omeprazole 20 MG capsule ?Commonly known as: PRILOSEC ?Take 1 capsule (20 mg total) by mouth daily. ?  ?pravastatin 40 MG tablet ?Commonly known as: PRAVACHOL ?Take 1 tablet (40 mg total) by mouth daily. ?  ?predniSONE 10 MG tablet ?Commonly known as: DELTASONE ?TAKE 1 TABLET (10 MG TOTAL) BY MOUTH DAILY WITH BREAKFAST. ?  ?roflumilast 500 MCG Tabs tablet ?Commonly known as: Daliresp ?Take 1 tablet (500 mcg total) by mouth daily. ?  ?sitaGLIPtin 100 MG tablet ?Commonly known as: Januvia ?Take 1 tablet (100 mg total) by mouth daily. ?   ? ?  ? ? ? ?Objective:  ? ?BP 135/70   Pulse 76   Ht $R'6\' 1"'kd$  (1.854 m)   Wt 237 lb (107.5 kg)   SpO2 99%   BMI 31.27 kg/m?   ?Wt Readings from Last 3 Encounters:  ?04/08/22 237 lb (107.5 kg)  ?02/26/22 239 lb 3.2 oz (108.5 kg)  ?01/21/22 235 lb (106.6 kg)  ?  ?Physical Exam ?Vitals and nursing note reviewed.  ?Constitutional:   ?   General: He is not in acute distress. ?   Appearance: He is well-developed. He is not diaphoretic.  ?Eyes:  ?   General: No scleral icterus. ?   Conjunctiva/sclera: Conjunctivae normal.  ?Neck:  ?   Thyroid: No thyromegaly.  ?Cardiovascular:  ?   Rate and Rhythm: Normal rate and regular rhythm.  ?   Heart sounds: Normal heart sounds. No murmur heard. ?Pulmonary:  ?   Effort: Pulmonary effort is normal. No respiratory distress.  ?   Breath sounds: Normal breath sounds. No wheezing.  ?Musculoskeletal:     ?   General: No swelling. Normal range of motion.  ?   Cervical back: Neck supple.  ?Lymphadenopathy:  ?   Cervical: No cervical adenopathy.  ?Skin: ?   General: Skin is warm and dry.  ?   Findings: No rash.  ?Neurological:  ?   Mental Status: He is alert and oriented to person, place, and time.  ?   Coordination: Coordination normal.  ?Psychiatric:     ?   Behavior: Behavior normal.  ? ? ? ? ?Assessment & Plan:  ? ?Problem List Items Addressed This Visit   ? ?  ? Cardiovascular and Mediastinum  ? Hypertension associated with diabetes ([place])  ? Relevant Orders  ? CBC with Differential/Platelet  ? CMP14+EGFR  ? Lipid panel  ? Bayer DCA Hb A1c Waived  ?  ? Respiratory  ? COPD (chronic obstructive pulmonary disease) ([place])  ?  ? Endocrine  ? Type 2 diabetes mellitus with other specified complication ([place]) - Primary  ? Relevant Orders  ? CBC with Differential/Platelet  ? CMP14+EGFR  ? Lipid panel  ? Bayer DCA Hb A1c Waived  ? ?Other Visit Diagnoses   ? ? Positive colorectal cancer screening using Cologuard test      ? Prostate cancer screening      ? Relevant Orders  ? PSA, total and free   ? ?  ?A1c is 7.2, slightly up, discussed diet and exercise changing.  No change in medicine for now. ? ?We will check blood work today.  Blood pressure looks good.  No changes there. ? ?Follow up plan: ?Return in about 3 months (around 07/08/2022), or if symptoms worsen or fail to improve, for Diabetes recheck. ? ?Counseling provided for all of the vaccine components ?Orders Placed This Encounter  ?Procedures  ? CBC with Differential/Platelet  ? CMP14+EGFR  ?  Lipid panel  ? PSA, total and free  ? Bayer DCA Hb A1c Waived  ? ? ?Caryl Pina, MD ?[place] ?04/08/2022, 11:31 AM ? ? ? ? ?

## 2022-04-09 LAB — CMP14+EGFR
ALT: 16 IU/L (ref 0–44)
AST: 18 IU/L (ref 0–40)
Albumin/Globulin Ratio: 1.6 (ref 1.2–2.2)
Albumin: 4.3 g/dL (ref 3.8–4.8)
Alkaline Phosphatase: 58 IU/L (ref 44–121)
BUN/Creatinine Ratio: 12 (ref 10–24)
BUN: 14 mg/dL (ref 8–27)
Bilirubin Total: 0.6 mg/dL (ref 0.0–1.2)
CO2: 22 mmol/L (ref 20–29)
Calcium: 9.5 mg/dL (ref 8.6–10.2)
Chloride: 104 mmol/L (ref 96–106)
Creatinine, Ser: 1.15 mg/dL (ref 0.76–1.27)
Globulin, Total: 2.7 g/dL (ref 1.5–4.5)
Glucose: 154 mg/dL — ABNORMAL HIGH (ref 70–99)
Potassium: 4.2 mmol/L (ref 3.5–5.2)
Sodium: 142 mmol/L (ref 134–144)
Total Protein: 7 g/dL (ref 6.0–8.5)
eGFR: 71 mL/min/{1.73_m2} (ref >59)

## 2022-04-09 LAB — CBC WITH DIFFERENTIAL/PLATELET
Basophils Absolute: 0.1 10*3/uL (ref 0.0–0.2)
Basos: 1 %
EOS (ABSOLUTE): 0.4 10*3/uL (ref 0.0–0.4)
Eos: 4 %
Hematocrit: 37.5 % (ref 37.5–51.0)
Hemoglobin: 12.7 g/dL — ABNORMAL LOW (ref 13.0–17.7)
Immature Grans (Abs): 0.1 10*3/uL (ref 0.0–0.1)
Immature Granulocytes: 1 %
Lymphocytes Absolute: 2.2 10*3/uL (ref 0.7–3.1)
Lymphs: 27 %
MCH: 29.2 pg (ref 26.6–33.0)
MCHC: 33.9 g/dL (ref 31.5–35.7)
MCV: 86 fL (ref 79–97)
Monocytes Absolute: 1.3 10*3/uL — ABNORMAL HIGH (ref 0.1–0.9)
Monocytes: 15 %
Neutrophils Absolute: 4.3 10*3/uL (ref 1.4–7.0)
Neutrophils: 52 %
Platelets: 196 10*3/uL (ref 150–450)
RBC: 4.35 x10E6/uL (ref 4.14–5.80)
RDW: 14.1 % (ref 11.6–15.4)
WBC: 8.3 10*3/uL (ref 3.4–10.8)

## 2022-04-09 LAB — LIPID PANEL
Chol/HDL Ratio: 3.5 ratio (ref 0.0–5.0)
Cholesterol, Total: 149 mg/dL (ref 100–199)
HDL: 43 mg/dL (ref >39)
LDL Chol Calc (NIH): 78 mg/dL (ref 0–99)
Triglycerides: 166 mg/dL — ABNORMAL HIGH (ref 0–149)
VLDL Cholesterol Cal: 28 mg/dL (ref 5–40)

## 2022-04-09 LAB — PSA, TOTAL AND FREE
PSA, Free Pct: 58.6 %
PSA, Free: 0.41 ng/mL
Prostate Specific Ag, Serum: 0.7 ng/mL (ref 0.0–4.0)

## 2022-04-27 ENCOUNTER — Other Ambulatory Visit: Payer: Self-pay | Admitting: Family Medicine

## 2022-04-27 ENCOUNTER — Other Ambulatory Visit: Payer: Self-pay | Admitting: Emergency Medicine

## 2022-04-27 DIAGNOSIS — K219 Gastro-esophageal reflux disease without esophagitis: Secondary | ICD-10-CM

## 2022-07-10 ENCOUNTER — Ambulatory Visit: Payer: Medicare Other | Admitting: Family Medicine

## 2022-07-12 ENCOUNTER — Other Ambulatory Visit: Payer: Self-pay | Admitting: Family Medicine

## 2022-07-13 ENCOUNTER — Other Ambulatory Visit: Payer: Self-pay | Admitting: Family Medicine

## 2022-07-13 DIAGNOSIS — E1169 Type 2 diabetes mellitus with other specified complication: Secondary | ICD-10-CM

## 2022-07-13 DIAGNOSIS — K219 Gastro-esophageal reflux disease without esophagitis: Secondary | ICD-10-CM

## 2022-07-13 DIAGNOSIS — E1159 Type 2 diabetes mellitus with other circulatory complications: Secondary | ICD-10-CM

## 2022-08-13 ENCOUNTER — Other Ambulatory Visit: Payer: Self-pay | Admitting: Family Medicine

## 2022-08-14 ENCOUNTER — Other Ambulatory Visit: Payer: Self-pay | Admitting: Emergency Medicine

## 2022-08-21 ENCOUNTER — Encounter: Payer: Self-pay | Admitting: Family Medicine

## 2022-08-21 ENCOUNTER — Ambulatory Visit (INDEPENDENT_AMBULATORY_CARE_PROVIDER_SITE_OTHER): Payer: Medicare Other | Admitting: Family Medicine

## 2022-08-21 VITALS — BP 139/76 | HR 88 | Temp 98.0°F | Ht 73.0 in | Wt 233.0 lb

## 2022-08-21 DIAGNOSIS — I152 Hypertension secondary to endocrine disorders: Secondary | ICD-10-CM | POA: Diagnosis not present

## 2022-08-21 DIAGNOSIS — J449 Chronic obstructive pulmonary disease, unspecified: Secondary | ICD-10-CM | POA: Diagnosis not present

## 2022-08-21 DIAGNOSIS — E1169 Type 2 diabetes mellitus with other specified complication: Secondary | ICD-10-CM

## 2022-08-21 DIAGNOSIS — E1159 Type 2 diabetes mellitus with other circulatory complications: Secondary | ICD-10-CM | POA: Diagnosis not present

## 2022-08-21 LAB — BAYER DCA HB A1C WAIVED: HB A1C (BAYER DCA - WAIVED): 6.8 % — ABNORMAL HIGH (ref 4.8–5.6)

## 2022-08-21 NOTE — Progress Notes (Signed)
BP 139/76   Pulse 88   Temp 98 F (36.7 C)   Ht 6' 1"  (1.854 m)   Wt 233 lb (105.7 kg)   SpO2 97%   BMI 30.74 kg/m    Subjective:   Patient ID: Craig Arnold, male    DOB: 1956-05-16, 66 y.o.   MRN: [id number]  HPI: Craig Arnold is a 66 y.o. male presenting on 08/21/2022 for Medical Management of Chronic Issues and Diabetes   HPI Type 2 diabetes mellitus Patient comes in today for recheck of his diabetes. Patient has been currently taking Januvia and metformin. Patient is currently on an ACE inhibitor/ARB. Patient has not seen an ophthalmologist this year. Patient denies any issues with their feet. The symptom started onset as an adult hypertension and COPD ARE RELATED TO DM   Hypertension Patient is currently on olmesartan, and their blood pressure today is 139/76. Patient denies any lightheadedness or dizziness. Patient denies headaches, blurred vision, chest pains, shortness of breath, or weakness. Denies any side effects from medication and is content with current medication.   COPD Patient is coming in for COPD recheck today.  He is currently on the rest and Claritin and Symbicort and albuterol.  He has a mild chronic cough but denies any major coughing spells or wheezing spells.  He has 1 nighttime symptoms per week and 1 daytime symptoms per week currently.   Relevant past medical, surgical, family and social history reviewed and updated as indicated. Interim medical history since our last visit reviewed. Allergies and medications reviewed and updated.  Review of Systems  Constitutional:  Negative for chills and fever.  Respiratory:  Negative for shortness of breath and wheezing.   Cardiovascular:  Negative for chest pain and leg swelling.  Musculoskeletal:  Negative for back pain and gait problem.  Skin:  Negative for rash.  Neurological:  Negative for dizziness and weakness.  All other systems reviewed and are negative.   Per HPI unless specifically indicated  above   Allergies as of 08/21/2022   No Known Allergies      Medication List        Accurate as of August 21, 2022  2:59 PM. If you have any questions, ask your nurse or doctor.          Accu-Chek Guide test strip Generic drug: glucose blood CHECK BLOOD SUGAR 3 TIMES A DAY E11.69   Accu-Chek Guide w/Device Kit Check BS 3 times a day Dx E11.69   Accu-Chek Softclix Lancet Dev Kit CHECK BLOOD SUGARS 3 TIMES A DAY Dx E11.69   Accu-Chek Softclix Lancets lancets CHECK BLOOD SUGAR 3 TIMES DAILY Dx E11.69   albuterol 108 (90 Base) MCG/ACT inhaler Commonly known as: VENTOLIN HFA INHALE 2 PUFFS BY MOUTH EVERY 4 HOURS AS NEEDED   budesonide-formoterol 160-4.5 MCG/ACT inhaler Commonly known as: Symbicort Inhale 2 puffs into the lungs 2 (two) times daily.   ferrous gluconate 324 MG tablet Commonly known as: FERGON Take 1 tablet (324 mg total) by mouth daily with breakfast.   fluticasone 50 MCG/ACT nasal spray Commonly known as: FLONASE Place 1 spray into both nostrils 2 (two) times daily as needed for allergies or rhinitis.   loratadine 10 MG tablet Commonly known as: CLARITIN Take 10 mg by mouth daily as needed.   metFORMIN 500 MG tablet Commonly known as: GLUCOPHAGE Take 1 tablet (500 mg total) by mouth 2 (two) times daily with a meal.   olmesartan 20 MG tablet Commonly known as: [employer]  Take 1 tablet (20 mg total) by mouth daily. (NEEDS TO BE SEEN BEFORE NEXT REFILL)   omeprazole 20 MG capsule Commonly known as: PRILOSEC Take 1 capsule (20 mg total) by mouth daily. (NEEDS TO BE SEEN BEFORE NEXT REFILL)   pravastatin 40 MG tablet Commonly known as: PRAVACHOL Take 1 tablet (40 mg total) by mouth daily. (NEEDS TO BE SEEN BEFORE NEXT REFILL)   predniSONE 10 MG tablet Commonly known as: DELTASONE TAKE 1 TABLET (10 MG TOTAL) BY MOUTH DAILY WITH BREAKFAST.   roflumilast 500 MCG Tabs tablet Commonly known as: Daliresp Take 1 tablet (500 mcg total) by mouth  daily.   sitaGLIPtin 100 MG tablet Commonly known as: Januvia Take 1 tablet (100 mg total) by mouth daily.         Objective:   BP 139/76   Pulse 88   Temp 98 F (36.7 C)   Ht 6' 1"  (1.854 m)   Wt 233 lb (105.7 kg)   SpO2 97%   BMI 30.74 kg/m   Wt Readings from Last 3 Encounters:  08/21/22 233 lb (105.7 kg)  04/08/22 237 lb (107.5 kg)  02/26/22 239 lb 3.2 oz (108.5 kg)    Physical Exam Vitals and nursing note reviewed.  Constitutional:      General: He is not in acute distress.    Appearance: He is well-developed. He is not diaphoretic.  Eyes:     General: No scleral icterus.    Conjunctiva/sclera: Conjunctivae normal.  Neck:     Thyroid: No thyromegaly.  Cardiovascular:     Rate and Rhythm: Normal rate and regular rhythm.     Heart sounds: Normal heart sounds. No murmur heard. Pulmonary:     Effort: Pulmonary effort is normal. No respiratory distress.     Breath sounds: Normal breath sounds. No wheezing.  Musculoskeletal:        General: No swelling.     Cervical back: Neck supple.  Lymphadenopathy:     Cervical: No cervical adenopathy.  Skin:    General: Skin is warm and dry.     Findings: No rash.  Neurological:     Mental Status: He is alert and oriented to person, place, and time.     Coordination: Coordination normal.  Psychiatric:        Behavior: Behavior normal.       Assessment & Plan:   Problem List Items Addressed This Visit       Cardiovascular and Mediastinum   Hypertension associated with diabetes ([place])     Respiratory   COPD (chronic obstructive pulmonary disease) ([place])     Endocrine   Type 2 diabetes mellitus with other specified complication (HCC) - Primary   Relevant Orders   Bayer DCA Hb A1c Waived    A1c looks good at 6.8, no changes, seems to be doing well. Follow up plan: Return in about 3 months (around 11/20/2022), or if symptoms worsen or fail to improve, for Diabetes hypertension and COPD.  Counseling provided  for all of the vaccine components Orders Placed This Encounter  Procedures   Bayer [place] Hb A1c Mul[place]ugh Araya Roel, MD [facility] 08/21/2022, 2:59 PM

## 2022-08-31 ENCOUNTER — Telehealth: Payer: Self-pay | Admitting: Emergency Medicine

## 2022-08-31 NOTE — Telephone Encounter (Signed)
ATC patient. No answer. Will try again.  AVS from last visit states ok for virtual visit!

## 2022-09-02 ENCOUNTER — Ambulatory Visit: Payer: Medicare Other | Admitting: Emergency Medicine

## 2022-09-02 NOTE — Telephone Encounter (Signed)
Appt  was stated on assignment for it to be a virtual visit.

## 2022-09-13 ENCOUNTER — Other Ambulatory Visit: Payer: Self-pay | Admitting: Family Medicine

## 2022-09-21 ENCOUNTER — Telehealth: Payer: Self-pay | Admitting: Family Medicine

## 2022-09-21 DIAGNOSIS — E1169 Type 2 diabetes mellitus with other specified complication: Secondary | ICD-10-CM

## 2022-09-21 NOTE — Telephone Encounter (Signed)
Will defer to PCP

## 2022-09-21 NOTE — Telephone Encounter (Signed)
Pt called to let Dr Dettinger know that he cant pick up his Januvia Rx because it will cost him over $400 for a 90 day supply because he is in the donut hole with his insurance. Needs advise on what to do.  Pt also says that he has been having issues with his BP and having dizzy spells. Wants to know if any of his medications cause dizziness.

## 2022-09-23 NOTE — Addendum Note (Signed)
Addended by: Alphonzo Dublin on: 09/23/2022 04:56 PM   Modules accepted: Orders

## 2022-09-23 NOTE — Telephone Encounter (Signed)
lmtcb

## 2022-09-23 NOTE — Telephone Encounter (Signed)
Reviewed Dr Neldon Mc note with pt. Pt voiced understanding. Says he will come to get samples but wants to know what hes supposed to take after he finishes the samples because hes going to be in the donut hole the rest of the year.  Pt says he can keep a check on his BP.  Wants to know if Dr Dettinger can send order for him to get a continuous glucose monitor because he has 2 different ways of checking his BS currently but says he doesn't know which one is accurate and they never read the same.  Please advise.

## 2022-09-23 NOTE — Telephone Encounter (Signed)
Per Dr. Warrick Parisian pt needs an appt with Almy[facility]. Referral created. Pt made aware.

## 2022-09-23 NOTE — Telephone Encounter (Signed)
Please tell him that I have a couple of samples here that he can come get.  If he continues to have lightheadedness and dizziness I want him to monitor his blood pressure closely but we may have to cut his blood pressure medicine in half if it continues but let me know what his blood pressures are running.  If he does not have a way to check his blood pressure then he needs to come in and be seen

## 2022-09-24 ENCOUNTER — Other Ambulatory Visit: Payer: Self-pay | Admitting: Family Medicine

## 2022-09-24 DIAGNOSIS — K219 Gastro-esophageal reflux disease without esophagitis: Secondary | ICD-10-CM

## 2022-09-24 DIAGNOSIS — E1169 Type 2 diabetes mellitus with other specified complication: Secondary | ICD-10-CM

## 2022-09-24 DIAGNOSIS — I152 Hypertension secondary to endocrine disorders: Secondary | ICD-10-CM

## 2022-09-27 ENCOUNTER — Other Ambulatory Visit: Payer: Self-pay | Admitting: Family Medicine

## 2022-09-27 DIAGNOSIS — E1159 Type 2 diabetes mellitus with other circulatory complications: Secondary | ICD-10-CM

## 2022-09-27 DIAGNOSIS — I152 Hypertension secondary to endocrine disorders: Secondary | ICD-10-CM

## 2022-09-29 ENCOUNTER — Encounter: Payer: Self-pay | Admitting: Emergency Medicine

## 2022-09-29 ENCOUNTER — Ambulatory Visit: Payer: Medicare Other | Admitting: Emergency Medicine

## 2022-09-29 DIAGNOSIS — J301 Allergic rhinitis due to pollen: Secondary | ICD-10-CM | POA: Diagnosis not present

## 2022-09-29 DIAGNOSIS — J449 Chronic obstructive pulmonary disease, unspecified: Secondary | ICD-10-CM

## 2022-09-29 DIAGNOSIS — K219 Gastro-esophageal reflux disease without esophagitis: Secondary | ICD-10-CM | POA: Diagnosis not present

## 2022-09-29 MED ORDER — PREDNISONE 5 MG PO TABS: 5.0000 mg | ORAL_TABLET | Freq: Every day | ORAL | 1 refills | Status: DC

## 2022-09-29 NOTE — Addendum Note (Signed)
Addended by: Gavin Potters R on: 09/29/2022 11:53 AM   Modules accepted: Orders

## 2022-09-29 NOTE — Assessment & Plan Note (Signed)
Overall doing well.  He actually stopped his Symbicort.  He remains on Daliresp and 10 mg prednisone.  We talked today about trying to taper the prednisone to off given its side effect profile.  He understands that he might have to go back on scheduled BD to tolerate.  We will try going to 5 mg daily and see how he does, consider going to 0 if he does well.  We will try slowly decreasing your prednisone.  Goal will be to stop this medication altogether if you are able to tolerate.  Decrease your prednisone to 5 mg once daily.  Keep track of any changes in your breathing, energy level, any other concerning symptoms and call if you have problems. We will hold off on restarting Symbicort for now.  You may need to consider going back on an every day inhaler as we come off the prednisone. Keep your albuterol available to use 2 puffs when you needed for shortness of breath, chest tightness, wheezing. Continue Daliresp as you have been taking it Follow Dr. Lamonte Sakai in 2 months or next available so we can assess your status on the new prednisone dose.

## 2022-09-29 NOTE — Assessment & Plan Note (Signed)
Continue omeprazole as you have been taking it 

## 2022-09-29 NOTE — Patient Instructions (Signed)
We will try slowly decreasing your prednisone.  Goal will be to stop this medication altogether if you are able to tolerate.  Decrease your prednisone to 5 mg once daily.  Keep track of any changes in your breathing, energy level, any other concerning symptoms and call if you have problems. We will hold off on restarting Symbicort for now.  You may need to consider going back on an every day inhaler as we come off the prednisone. Keep your albuterol available to use 2 puffs when you needed for shortness of breath, chest tightness, wheezing. Continue Daliresp as you have been taking it Continue omeprazole as you have been taking it You might benefit from restarting your loratadine and or fluticasone nasal spray at some point in the future depending on how your allergy symptoms and drainage are doing (especially as we come off the prednisone) Follow Dr. Lamonte Sakai in 2 months or next available so we can assess your status on the new prednisone dose.

## 2022-09-29 NOTE — Progress Notes (Signed)
HPI:  ROV 02/26/22 --pleasant 67 year old gentleman with a history of COPD/asthma, remote MAIC with associated bronchiectasis, chronic cough due to this as well as some upper airway instability and rhinitis.  He has not had CT reimaging in several years because has been clinically stable.  The last several visits have been virtual visits.  He is here today in person.  Managing on prednisone 10 mg daily, Daliresp, Symbicort.  Uses albuterol approximately 0-2x a day.  Loratadine as needed, flonase prn.  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 The weather impacts his breathing status. He hears some wheeze, not every day. Relieved by albuterol. He does have some wheeze at night when he lays down. No flares.   ROV 09/29/22 --Craig Arnold 66 and has a history of COPD/asthma, bronchiectasis with remote Mycobacterium avium.  He has chronic cough in the setting of this as well as some upper airway symptoms from chronic rhinitis.  He has been managed on chronic prednisone, remains on 10 mg daily.  Had been on Symbicort but stopped it and appears to have tolerated.  On Daliresp and omeprazole, loratadine and fluticasone nasal spray if needed.   Today he reports that he is able to remains active, works on the farm, works on [employer], Social research officer, government. He has intermittent wheeze, not every day - responds to albuterol, does not need it every day. He has a lot of nasal gtt in the am, but not enough to make him want to be back on flonase or loratadine.     EXAM:  Vitals:   09/29/22 1125  BP: 128/78  Pulse: 82  Temp: 98.6 F (37 C)  TempSrc: Oral  SpO2: 98%  Weight: 231 lb (104.8 kg)  Height: '6\' 2"'$  (1.88 m)    Gen: Pleasant, obese, in no distress,  normal affect, stutters  ENT: No lesions,  mouth clear,  oropharynx clear, no postnasal drip  Neck: No JVD, no Stridor  Lungs: Mostly clear.  No wheezing.  Soft end-exp wheeze  Cardiovascular: RRR, heart sounds normal, no murmur or gallops, no peripheral  edema  Musculoskeletal: No deformities, no cyanosis or clubbing  Neuro: alert, non focal  Skin: Warm, no lesions or rashes   COPD (chronic obstructive pulmonary disease) (HCC) Overall doing well.  He actually stopped his Symbicort.  He remains on Daliresp and 10 mg prednisone.  We talked today about trying to taper the prednisone to off given its side effect profile.  He understands that he might have to go back on scheduled BD to tolerate.  We will try going to 5 mg daily and see how he does, consider going to 0 if he does well.  We will try slowly decreasing your prednisone.  Goal will be to stop this medication altogether if you are able to tolerate.  Decrease your prednisone to 5 mg once daily.  Keep track of any changes in your breathing, energy level, any other concerning symptoms and call if you have problems. We will hold off on restarting Symbicort for now.  You may need to consider going back on an every day inhaler as we come off the prednisone. Keep your albuterol available to use 2 puffs when you needed for shortness of breath, chest tightness, wheezing. Continue Daliresp as you have been taking it Follow Dr. Lamonte Sakai in 2 months or next available so we can assess your status on the new prednisone dose.  ALLERGIC RHINITIS DUE TO POLLEN You might benefit from restarting your  loratadine and or fluticasone nasal spray at some point in the future depending on how your allergy symptoms and drainage are doing (especially as we come off the prednisone)   GERD Continue omeprazole as you have been taking it   Baltazar Apo, MD, PhD 09/29/2022, 11:49 AM [facility] [phone] or if no answer [phone]

## 2022-09-29 NOTE — Assessment & Plan Note (Signed)
You might benefit from restarting your loratadine and or fluticasone nasal spray at some point in the future depending on how your allergy symptoms and drainage are doing (especially as we come off the prednisone)

## 2022-10-16 ENCOUNTER — Other Ambulatory Visit: Payer: Self-pay

## 2022-10-16 DIAGNOSIS — I152 Hypertension secondary to endocrine disorders: Secondary | ICD-10-CM

## 2022-10-16 DIAGNOSIS — E1169 Type 2 diabetes mellitus with other specified complication: Secondary | ICD-10-CM

## 2022-10-16 DIAGNOSIS — E1159 Type 2 diabetes mellitus with other circulatory complications: Secondary | ICD-10-CM

## 2022-10-25 ENCOUNTER — Other Ambulatory Visit: Payer: Self-pay | Admitting: Emergency Medicine

## 2022-10-26 NOTE — Telephone Encounter (Signed)
Pharmacy is requesting '10mg'$ , LOV 09/29/22  you recommend '5mg'$ . Please advise

## 2022-10-28 ENCOUNTER — Telehealth: Payer: Self-pay

## 2022-10-28 NOTE — Progress Notes (Signed)
  Chronic Care Management   Note  10/28/2022 Name: Craig Arnold MRN: [id number] DOB: 08/01/1956  Craig Arnold is a 66 y.o. year old male who is a primary care patient of Dettinger, Fransisca Kaufmann, MD. I reached out to [name] by phone today in response to a referral sent by Craig Arnold's PCP.  Craig Arnold  declinedto scheduling an appointment with the CCM Pharmacist   Follow up plan: Patient did not agree to scheduling an appointment with the Pharmacist. The ordering provider has been notified.   Noreene Larsson, [place], [place] [address] Direct Dial: [phone] [email or web address]

## 2022-11-13 ENCOUNTER — Other Ambulatory Visit: Payer: Self-pay | Admitting: Family Medicine

## 2022-11-13 ENCOUNTER — Other Ambulatory Visit: Payer: Self-pay | Admitting: Emergency Medicine

## 2022-11-13 DIAGNOSIS — E1169 Type 2 diabetes mellitus with other specified complication: Secondary | ICD-10-CM

## 2022-11-13 DIAGNOSIS — I152 Hypertension secondary to endocrine disorders: Secondary | ICD-10-CM

## 2022-11-13 DIAGNOSIS — K219 Gastro-esophageal reflux disease without esophagitis: Secondary | ICD-10-CM

## 2022-11-23 ENCOUNTER — Encounter: Payer: Self-pay | Admitting: Family Medicine

## 2022-11-23 ENCOUNTER — Ambulatory Visit (INDEPENDENT_AMBULATORY_CARE_PROVIDER_SITE_OTHER): Payer: Medicare Other | Admitting: Family Medicine

## 2022-11-23 VITALS — BP 123/67 | HR 89 | Temp 98.4°F | Ht 74.0 in | Wt 233.0 lb

## 2022-11-23 DIAGNOSIS — K219 Gastro-esophageal reflux disease without esophagitis: Secondary | ICD-10-CM

## 2022-11-23 DIAGNOSIS — E1159 Type 2 diabetes mellitus with other circulatory complications: Secondary | ICD-10-CM | POA: Diagnosis not present

## 2022-11-23 DIAGNOSIS — J449 Chronic obstructive pulmonary disease, unspecified: Secondary | ICD-10-CM | POA: Diagnosis not present

## 2022-11-23 DIAGNOSIS — E1169 Type 2 diabetes mellitus with other specified complication: Secondary | ICD-10-CM

## 2022-11-23 DIAGNOSIS — I152 Hypertension secondary to endocrine disorders: Secondary | ICD-10-CM | POA: Diagnosis not present

## 2022-11-23 DIAGNOSIS — E1140 Type 2 diabetes mellitus with diabetic neuropathy, unspecified: Secondary | ICD-10-CM

## 2022-11-23 LAB — BAYER DCA HB A1C WAIVED: HB A1C (BAYER DCA - WAIVED): 7.6 % — ABNORMAL HIGH (ref 4.8–5.6)

## 2022-11-23 MED ORDER — OLMESARTAN MEDOXOMIL 20 MG PO TABS: 20.0000 mg | ORAL_TABLET | Freq: Every day | ORAL | 3 refills | Status: DC

## 2022-11-23 MED ORDER — OMEPRAZOLE 20 MG PO CPDR: 20.0000 mg | DELAYED_RELEASE_CAPSULE | Freq: Every day | ORAL | 3 refills | Status: DC

## 2022-11-23 MED ORDER — ACCU-CHEK SOFTCLIX LANCETS MISC: 3 refills | Status: DC

## 2022-11-23 MED ORDER — PRAVASTATIN SODIUM 40 MG PO TABS: 40.0000 mg | ORAL_TABLET | Freq: Every day | ORAL | 3 refills | Status: DC

## 2022-11-23 MED ORDER — SITAGLIPTIN PHOSPHATE 100 MG PO TABS: 100.0000 mg | ORAL_TABLET | Freq: Every day | ORAL | 3 refills | Status: DC

## 2022-11-23 NOTE — Progress Notes (Signed)
BP 123/67   Pulse 89   Temp 98.4 F (36.9 C)   Ht _0  (1.88 m)   Wt 233 lb (105.7 kg)   SpO2 100%   BMI 29.92 kg/m    Subjective:   Patient ID: Craig Arnold, male    DOB: 1956/11/13, 66 y.o.   MRN: [id number]  HPI: Tahj Njoku is a 66 y.o. male presenting on 11/23/2022 for Medical Management of Chronic Issues and Diabetes   HPI Type 2 diabetes mellitus Patient comes in today for recheck of his diabetes. Patient has been currently taking metformin and Januvia although he admits he has not been taking the Januvia because of cost.. Patient is currently on an ACE inhibitor/ARB. Patient has seen an ophthalmologist this year. Patient complains of worse neuropathy and burning in his feet, especially under his toes and towards his ankle. The symptom started onset as an adult hypertension and hyperlipidemia and neuropathy ARE RELATED TO DM   Hypertension Patient is currently on olmesartan, and their blood pressure today is 123/67. Patient denies any lightheadedness or dizziness. Patient denies headaches, blurred vision, chest pains, shortness of breath, or weakness. Denies any side effects from medication and is content with current medication.   COPD Patient is coming in for COPD recheck today.  He is currently on Symbicort and albuterol and Daliresp.  He has a mild chronic cough but denies any major coughing spells or wheezing spells.  He has 1 nighttime symptoms per week and 1 daytime symptoms per week currently.   Relevant past medical, surgical, family and social history reviewed and updated as indicated. Interim medical history since our last visit reviewed. Allergies and medications reviewed and updated.  Review of Systems  Constitutional:  Negative for chills and fever.  Eyes:  Negative for visual disturbance.  Respiratory:  Negative for shortness of breath and wheezing.   Cardiovascular:  Negative for chest pain and leg swelling.  Musculoskeletal:  Negative for back pain and  gait problem.  Skin:  Negative for rash.  Neurological:  Negative for dizziness, weakness and light-headedness.  All other systems reviewed and are negative.   Per HPI unless specifically indicated above   Allergies as of 11/23/2022   No Known Allergies      Medication List        Accurate as of November 23, 2022 10:27 AM. If you have any questions, ask your nurse or doctor.          STOP taking these medications    ferrous gluconate 324 MG tablet Commonly known as: FERGON Stopped by: Fransisca Kaufmann Anhad Sheeley, MD       TAKE these medications    Accu-Chek Guide test strip Generic drug: glucose blood CHECK BLOOD SUGAR 3 TIMES A DAY E11.69   Accu-Chek Guide w/Device Kit Check BS 3 times a day Dx E11.69   Accu-Chek Softclix Lancet Dev Kit CHECK BLOOD SUGARS 3 TIMES A DAY Dx E11.69   Accu-Chek Softclix Lancets lancets CHECK BLOOD SUGAR 3 TIMES DAILY Dx E11.69   albuterol 108 (90 Base) MCG/ACT inhaler Commonly known as: VENTOLIN HFA INHALE 2 PUFFS BY MOUTH EVERY 4 HOURS AS NEEDED   fluticasone 50 MCG/ACT nasal spray Commonly known as: FLONASE Place 1 spray into both nostrils 2 (two) times daily as needed for allergies or rhinitis.   loratadine 10 MG tablet Commonly known as: CLARITIN Take 10 mg by mouth daily as needed.   metFORMIN 500 MG tablet Commonly known as: GLUCOPHAGE Take 1 tablet (500  mg total) by mouth 2 (two) times daily with a meal.   olmesartan 20 MG tablet Commonly known as: BENICAR Take 1 tablet (20 mg total) by mouth daily. What changed: additional instructions Changed by: Fransisca Kaufmann Aida Lemaire, MD   omeprazole 20 MG capsule Commonly known as: PRILOSEC Take 1 capsule (20 mg total) by mouth daily. What changed: how much to take Changed by: Fransisca Kaufmann Idrissa Beville, MD   pravastatin 40 MG tablet Commonly known as: PRAVACHOL Take 1 tablet (40 mg total) by mouth daily.   predniSONE 5 MG tablet Commonly known as: DELTASONE TAKE 1 TABLET BY MOUTH  EVERY DAY WITH BREAKFAST What changed: Another medication with the same name was removed. Continue taking this medication, and follow the directions you see here. Changed by: Fransisca Kaufmann Shanitha Twining, MD   roflumilast 500 MCG Tabs tablet Commonly known as: Daliresp Take 1 tablet (500 mcg total) by mouth daily.   sitaGLIPtin 100 MG tablet Commonly known as: Januvia Take 1 tablet (100 mg total) by mouth daily.   Symbicort 160-4.5 MCG/ACT inhaler Generic drug: budesonide-formoterol INHALE 2 PUFFS INTO THE LUNGS TWICE A DAY         Objective:   BP 123/67   Pulse 89   Temp 98.4 F (36.9 C)   Ht _0  (1.88 m)   Wt 233 lb (105.7 kg)   SpO2 100%   BMI 29.92 kg/m   Wt Readings from Last 3 Encounters:  11/23/22 233 lb (105.7 kg)  09/29/22 231 lb (104.8 kg)  08/21/22 233 lb (105.7 kg)    Physical Exam Vitals and nursing note reviewed.  Constitutional:      General: He is not in acute distress.    Appearance: He is well-developed. He is not diaphoretic.  Eyes:     General: No scleral icterus.    Conjunctiva/sclera: Conjunctivae normal.  Neck:     Thyroid: No thyromegaly.  Cardiovascular:     Rate and Rhythm: Normal rate and regular rhythm.     Heart sounds: Normal heart sounds. No murmur heard. Pulmonary:     Effort: Pulmonary effort is normal. No respiratory distress.     Breath sounds: Normal breath sounds. No wheezing.  Musculoskeletal:        General: No swelling. Normal range of motion.     Cervical back: Neck supple.  Lymphadenopathy:     Cervical: No cervical adenopathy.  Skin:    General: Skin is warm and dry.     Findings: No rash.  Neurological:     Mental Status: He is alert and oriented to person, place, and time.     Coordination: Coordination normal.  Psychiatric:        Behavior: Behavior normal.     Results for orders placed or performed in visit on 08/25/22  HM DIABETES EYE EXAM  Result Value Ref Range   HM Diabetic Eye Exam No Retinopathy No  Retinopathy    Assessment & Plan:   Problem List Items Addressed This Visit       Cardiovascular and Mediastinum   Hypertension associated with diabetes ([place])   Relevant Medications   sitaGLIPtin (JANUVIA) 100 MG tablet   olmesartan (BENICAR) 20 MG tablet   pravastatin (PRAVACHOL) 40 MG tablet   Other Relevant Orders   CBC with Differential/Platelet   CMP14+EGFR   Lipid panel   Bayer DCA Hb A1c Waived   AMB Referral to Chronic Care Management Services   Microalbumin / creatinine urine ratio  Respiratory   COPD (chronic obstructive pulmonary disease) ([place])   Relevant Orders   AMB Referral to Chronic Care Management Services     Digestive   GERD   Relevant Medications   omeprazole (PRILOSEC) 20 MG capsule     Endocrine   Type 2 diabetes mellitus with other specified complication (HCC) - Primary   Relevant Medications   sitaGLIPtin (JANUVIA) 100 MG tablet   olmesartan (BENICAR) 20 MG tablet   pravastatin (PRAVACHOL) 40 MG tablet   Other Relevant Orders   CBC with Differential/Platelet   CMP14+EGFR   Lipid panel   Bayer DCA Hb A1c Waived   AMB Referral to Chronic Care Management Services   Microalbumin / creatinine urine ratio   Other Visit Diagnoses     Type 2 diabetes mellitus with diabetic neuropathy, without long-term current use of insulin (HCC)       Relevant Medications   sitaGLIPtin (JANUVIA) 100 MG tablet   olmesartan (BENICAR) 20 MG tablet   pravastatin (PRAVACHOL) 40 MG tablet     Will try and restart the Januvia, he does have some samples and will refer to Almyra Free to see if we can get help with the Januvia and assistance.  A1c slightly elevated at 7.6.  He is also going to focus on diet and reduce his ice cream intake and breads.  Follow up plan: No follow-ups on file.  Counseling provided for all of the vaccine components Orders Placed This Encounter  Procedures   CBC with Differential/Platelet   CMP14+EGFR   Lipid panel   Bayer DCA Hb A1c  Waived   Microalbumin / creatinine urine ratio   AMB Referral to Chronic Care Management Services    Caryl Pina, MD [facility] 11/23/2022, 10:27 AM

## 2022-11-24 ENCOUNTER — Other Ambulatory Visit: Payer: Medicare Other

## 2022-11-24 DIAGNOSIS — E1159 Type 2 diabetes mellitus with other circulatory complications: Secondary | ICD-10-CM | POA: Diagnosis not present

## 2022-11-24 DIAGNOSIS — E1169 Type 2 diabetes mellitus with other specified complication: Secondary | ICD-10-CM

## 2022-11-24 DIAGNOSIS — I152 Hypertension secondary to endocrine disorders: Secondary | ICD-10-CM | POA: Diagnosis not present

## 2022-11-24 LAB — CBC WITH DIFFERENTIAL/PLATELET
Basophils Absolute: 0.1 10*3/uL (ref 0.0–0.2)
Basos: 1 %
EOS (ABSOLUTE): 0.7 10*3/uL — ABNORMAL HIGH (ref 0.0–0.4)
Eos: 12 %
Hematocrit: 37.7 % (ref 37.5–51.0)
Hemoglobin: 12.5 g/dL — ABNORMAL LOW (ref 13.0–17.7)
Immature Grans (Abs): 0 10*3/uL (ref 0.0–0.1)
Immature Granulocytes: 0 %
Lymphocytes Absolute: 1.7 10*3/uL (ref 0.7–3.1)
Lymphs: 28 %
MCH: 28.9 pg (ref 26.6–33.0)
MCHC: 33.2 g/dL (ref 31.5–35.7)
MCV: 87 fL (ref 79–97)
Monocytes Absolute: 1 10*3/uL — ABNORMAL HIGH (ref 0.1–0.9)
Monocytes: 16 %
Neutrophils Absolute: 2.6 10*3/uL (ref 1.4–7.0)
Neutrophils: 43 %
Platelets: 165 10*3/uL (ref 150–450)
RBC: 4.33 x10E6/uL (ref 4.14–5.80)
RDW: 13 % (ref 11.6–15.4)
WBC: 6 10*3/uL (ref 3.4–10.8)

## 2022-11-24 LAB — CMP14+EGFR
ALT: 13 IU/L (ref 0–44)
AST: 16 IU/L (ref 0–40)
Albumin/Globulin Ratio: 1.9 (ref 1.2–2.2)
Albumin: 4.4 g/dL (ref 3.9–4.9)
Alkaline Phosphatase: 76 IU/L (ref 44–121)
BUN/Creatinine Ratio: 15 (ref 10–24)
BUN: 13 mg/dL (ref 8–27)
Bilirubin Total: 0.5 mg/dL (ref 0.0–1.2)
CO2: 20 mmol/L (ref 20–29)
Calcium: 9.2 mg/dL (ref 8.6–10.2)
Chloride: 103 mmol/L (ref 96–106)
Creatinine, Ser: 0.86 mg/dL (ref 0.76–1.27)
Globulin, Total: 2.3 g/dL (ref 1.5–4.5)
Glucose: 169 mg/dL — ABNORMAL HIGH (ref 70–99)
Potassium: 4.1 mmol/L (ref 3.5–5.2)
Sodium: 139 mmol/L (ref 134–144)
Total Protein: 6.7 g/dL (ref 6.0–8.5)
eGFR: 96 mL/min/{1.73_m2} (ref >59)

## 2022-11-24 LAB — LIPID PANEL
Chol/HDL Ratio: 3.3 ratio (ref 0.0–5.0)
Cholesterol, Total: 156 mg/dL (ref 100–199)
HDL: 47 mg/dL (ref >39)
LDL Chol Calc (NIH): 81 mg/dL (ref 0–99)
Triglycerides: 163 mg/dL — ABNORMAL HIGH (ref 0–149)
VLDL Cholesterol Cal: 28 mg/dL (ref 5–40)

## 2022-11-26 ENCOUNTER — Other Ambulatory Visit: Payer: Self-pay | Admitting: Emergency Medicine

## 2022-11-26 LAB — MICROALBUMIN / CREATININE URINE RATIO
Creatinine, Urine: 100.8 mg/dL
Microalb/Creat Ratio: 17 mg/g creat (ref 0–29)
Microalbumin, Urine: 17.6 ug/mL

## 2022-12-02 ENCOUNTER — Ambulatory Visit: Payer: Medicare Other | Admitting: Emergency Medicine

## 2022-12-04 ENCOUNTER — Ambulatory Visit: Payer: Medicare Other | Admitting: Emergency Medicine

## 2022-12-04 ENCOUNTER — Encounter: Payer: Self-pay | Admitting: Emergency Medicine

## 2022-12-04 VITALS — BP 118/62 | HR 93 | Temp 98.2°F | Ht 73.0 in | Wt 233.2 lb

## 2022-12-04 DIAGNOSIS — J449 Chronic obstructive pulmonary disease, unspecified: Secondary | ICD-10-CM | POA: Diagnosis not present

## 2022-12-04 DIAGNOSIS — R918 Other nonspecific abnormal finding of lung field: Secondary | ICD-10-CM

## 2022-12-04 DIAGNOSIS — K219 Gastro-esophageal reflux disease without esophagitis: Secondary | ICD-10-CM

## 2022-12-04 DIAGNOSIS — J301 Allergic rhinitis due to pollen: Secondary | ICD-10-CM

## 2022-12-04 MED ORDER — PREDNISONE 1 MG PO TABS: 1.0000 mg | ORAL_TABLET | Freq: Every day | ORAL | 0 refills | Status: DC

## 2022-12-04 NOTE — Assessment & Plan Note (Signed)
Continue omeprazole 

## 2022-12-04 NOTE — Assessment & Plan Note (Signed)
Symptoms continue to be well-controlled.  Much less intrusive than several years ago.  He is interested in continuing to wean his prednisone and we will make a plan to do so.  I did talk to him today about the signs and symptoms consistent with adrenal insufficiency.  Keep albuterol available to use 2 puffs if needed for shortness of breath, chest tightness, wheezing We will give you a prescription for prednisone 1 mg tablets and plan a taper as follows: -4 mg daily for 3 weeks, then -3 mg daily for 3 weeks, then -2 mg daily for 3 weeks, then -1 mg daily for 3 weeks, then -Stop completely If at any point you develop progressive shortness of breath as you are tapering your prednisone please call our office.  We may decide to restart his Symbicort at that time. If at any point you develop signs of adrenal insufficiency that include weakness, fatigue, nausea, poor appetite, or any concerning symptoms please call our office.  We may need to adjust her prednisone based on this. Continue Daliresp as you have been taking it Follow Dr. Lamonte Sakai in 3 months or sooner if you have any problems.

## 2022-12-04 NOTE — Progress Notes (Signed)
HPI:  ROV 02/26/22 --pleasant 66 year old gentleman with a history of COPD/asthma, remote MAIC with associated bronchiectasis, chronic cough due to this as well as some upper airway instability and rhinitis.  He has not had CT reimaging in several years because has been clinically stable.  The last several visits have been virtual visits.  He is here today in person.  Managing on prednisone 10 mg daily, Daliresp, Symbicort.  Uses albuterol approximately 0-2x a day.  Loratadine as needed, flonase prn.  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 The weather impacts his breathing status. He hears some wheeze, not every day. Relieved by albuterol. He does have some wheeze at night when he lays down. No flares.   ROV 09/29/22 --[name] 66 and has a history of COPD/asthma, bronchiectasis with remote Mycobacterium avium.  He has chronic cough in the setting of this as well as some upper airway symptoms from chronic rhinitis.  He has been managed on chronic prednisone, remains on 10 mg daily.  Had been on Symbicort but stopped it and appears to have tolerated.  On Daliresp and omeprazole, loratadine and fluticasone nasal spray if needed.   Today he reports that he is able to remains active, works on the farm, works on [employer], Social research officer, government. He has intermittent wheeze, not every day - responds to albuterol, does not need it every day. He has a lot of nasal gtt in the am, but not enough to make him want to be back on flonase or loratadine.   ROV 12/04/22 --Craig Arnold follows up today for his history of COPD with asthmatic features, bronchiectasis with remote Mycobacterium avium, chronic cough and upper airway irritation syndrome, chronic rhinitis.  He has been on daily prednisone.  At his last visit we tried going from 10 mg daily to 5 mg daily.  He previously was on Symbicort as his maintenance but was able to stop it and did not seem to miss it.  He remains on Daliresp, omeprazole.  He reports today that he feels  the same, no worse on lower dose pred. Minimal albuterol use, sometimes at night for wheeze.   He quit smoking 14 yrs ago, is interested in a screening Ct chest.     EXAM:  Vitals:   12/04/22 1120  BP: 118/62  Pulse: 93  Temp: 98.2 F (36.8 C)  TempSrc: Oral  SpO2: 98%  Weight: 233 lb 3.2 oz (105.8 kg)  Height: '6\' 1"'$  (1.854 m)    Gen: Pleasant, obese, in no distress,  normal affect, stutters  ENT: No lesions,  mouth clear,  oropharynx clear, no postnasal drip  Neck: No JVD, no Stridor  Lungs: Mostly clear.  No wheezing.  Soft end-exp wheeze  Cardiovascular: RRR, heart sounds normal, no murmur or gallops, no peripheral edema  Musculoskeletal: No deformities, no cyanosis or clubbing  Neuro: alert, non focal  Skin: Warm, no lesions or rashes   Pulmonary nodules Had been stable on serial imaging (remote).  He stopped smoking 14 years ago, should qualify for lung cancer screening.  He is interested in participating in [facility].  We will make a referral.  COPD (chronic obstructive pulmonary disease) ([place]) Symptoms continue to be well-controlled.  Much less intrusive than several years ago.  He is interested in continuing to wean his prednisone and we will make a plan to do so.  I did talk to him today about the signs and symptoms consistent with adrenal insufficiency.  Keep albuterol available to  use 2 puffs if needed for shortness of breath, chest tightness, wheezing We will give you a prescription for prednisone 1 mg tablets and plan a taper as follows: -4 mg daily for 3 weeks, then -3 mg daily for 3 weeks, then -2 mg daily for 3 weeks, then -1 mg daily for 3 weeks, then -Stop completely If at any point you develop progressive shortness of breath as you are tapering your prednisone please call our office.  We may decide to restart his Symbicort at that time. If at any point you develop signs of adrenal insufficiency that include weakness, fatigue, nausea, poor appetite, or  any concerning symptoms please call our office.  We may need to adjust her prednisone based on this. Continue Daliresp as you have been taking it Follow Dr. Lamonte Sakai in 3 months or sooner if you have any problems.  Allergic rhinitis due to pollen Keep your loratadine and fluticasone nasal spray available to use when you need them  GERD Continue omeprazole    Craig Apo, MD, PhD 12/04/2022, 12:00 PM [facility] [phone] or if no answer [phone]

## 2022-12-04 NOTE — Addendum Note (Signed)
Addended by: Monna Fam L on: 12/04/2022 12:03 PM   Modules accepted: Orders

## 2022-12-04 NOTE — Assessment & Plan Note (Signed)
Had been stable on serial imaging (remote).  He stopped smoking 14 years ago, should qualify for lung cancer screening.  He is interested in participating in [facility].  We will make a referral.

## 2022-12-04 NOTE — Assessment & Plan Note (Signed)
Keep your loratadine and fluticasone nasal spray available to use when you need them

## 2022-12-04 NOTE — Patient Instructions (Addendum)
Keep albuterol available to use 2 puffs if needed for shortness of breath, chest tightness, wheezing We will give you a prescription for prednisone 1 mg tablets and plan a taper as follows: -4 mg daily for 3 weeks, then -3 mg daily for 3 weeks, then -2 mg daily for 3 weeks, then -1 mg daily for 3 weeks, then -Stop completely If at any point you develop progressive shortness of breath as you are tapering your prednisone please call our office.  We may decide to restart his Symbicort at that time. If at any point you develop signs of adrenal insufficiency that include weakness, fatigue, nausea, poor appetite, or any concerning symptoms please call our office.  We may need to adjust her prednisone based on this. Continue Daliresp as you have been taking it Keep your loratadine and fluticasone nasal spray available to use when you need them Continue omeprazole Follow Dr. Lamonte Sakai in 3 months or sooner if you have any problems. We will refer you to the lung cancer screening program at [facility].  You should qualify for a screening CT scan of your chest since you stopped smoking 14 years ago.

## 2022-12-09 LAB — HM DIABETES EYE EXAM

## 2022-12-10 ENCOUNTER — Telehealth: Payer: Self-pay

## 2022-12-10 NOTE — Progress Notes (Signed)
  Chronic Care Management   Note  12/10/2022 Name: Oakes Mccready MRN: [id number] DOB: January 12, 1956  Zeferino Mounts is a 66 y.o. year old male who is a primary care patient of Dettinger, Fransisca Kaufmann, MD. I reached out to [name] by phone today in response to a referral sent by Mr. Hartley Saefong's PCP.  The first contact attempt was unsuccessful.   Follow up plan: Additional outreach attempts will be made.  Noreene Larsson, [place], [place] [address] Direct Dial: [phone] [email or web address]

## 2022-12-21 ENCOUNTER — Other Ambulatory Visit: Payer: Self-pay | Admitting: Emergency Medicine

## 2022-12-28 ENCOUNTER — Other Ambulatory Visit: Payer: Self-pay | Admitting: Emergency Medicine

## 2022-12-28 ENCOUNTER — Telehealth: Payer: Self-pay | Admitting: Emergency Medicine

## 2022-12-28 ENCOUNTER — Other Ambulatory Visit: Payer: Self-pay | Admitting: Family Medicine

## 2022-12-28 NOTE — Telephone Encounter (Signed)
ATC X1 LVM for patient to give our office a call back 

## 2022-12-29 MED ORDER — PREDNISONE 1 MG PO TABS: ORAL_TABLET | ORAL | 0 refills | Status: DC

## 2022-12-29 NOTE — Telephone Encounter (Signed)
Called and spoke with patient. He confirmed that he is currently at the '3mg'$  daily dose of prednisone. I advised him that I would call the pharmacy to get the prescription corrected.   Called and spoke with Broadus John at [facility]. He confirmed that they had received a prednisone '1mg'$  prescription for 90 tablets. I confirmed that this prescription was the correct strength but the incorrect amount. He will void out this prescription and I will send the correct taper for him.  Called and spoke with patient. He verbalized understanding.   Nothing further needed at time of call.

## 2022-12-29 NOTE — Telephone Encounter (Signed)
PT calling back. Adv we tried to reach him yesterday Arby [place]rette). He did go to Pharm for refill but they said we'd have to call. No refills. PT said we told him he had refills. Pls call to adv. TY

## 2022-12-29 NOTE — Telephone Encounter (Signed)
Call PT at [phone]. Don't call home #. TY

## 2023-01-05 NOTE — Progress Notes (Signed)
  Chronic Care Management   Note  01/05/2023 Name: Tyrail Grandfield MRN: [id number] DOB: [birth year]  Evertt Chouinard is a 67 y.o. year old male who is a primary care patient of Dettinger, Fransisca Kaufmann, MD. I reached out to [name] by phone today in response to a referral sent by Mr. [place] Pitcock's PCP.  The second contact attempt was unsuccessful.   Follow up plan: Additional outreach attempts will be made.  Noreene Larsson, [place], [place] [address] Direct Dial: [phone] [email or web address]  \

## 2023-01-13 NOTE — Progress Notes (Signed)
  Chronic Care Management   Note  01/13/2023 Name: Tomie Elko MRN: [id number] DOB: 1956-03-02  Javius Sylla is a 67 y.o. year old male who is a primary care patient of Dettinger, Fransisca Kaufmann, MD. I reached out to [name] by phone today in response to a referral sent by Mr. Cowl[place] Hirota's PCP.  The third contact attempt was unsuccessful.   Follow up plan: Unable to reach patient after 3 attempts. No further outreach attempts will be made pending additional provider engagement, patient request, or new provider order.   Noreene Larsson, [place], [place] [address] Direct Dial: [phone] [email or web address]

## 2023-01-17 ENCOUNTER — Other Ambulatory Visit: Payer: Self-pay | Admitting: Emergency Medicine

## 2023-01-22 ENCOUNTER — Ambulatory Visit (INDEPENDENT_AMBULATORY_CARE_PROVIDER_SITE_OTHER): Payer: Medicare Other

## 2023-01-22 VITALS — Ht 73.0 in | Wt 234.0 lb

## 2023-01-22 DIAGNOSIS — Z0000 Encounter for general adult medical examination without abnormal findings: Secondary | ICD-10-CM | POA: Diagnosis not present

## 2023-01-22 NOTE — Progress Notes (Signed)
Subjective:   Sherron Ipock is a 67 y.o. male who presents for Medicare Annual/Subsequent preventive examination. I connected with  [name] on 01/22/23 by a audio enabled telemedicine application and verified that I am speaking with the correct person using two identifiers.  Patient Location: Home  Provider Location: Home Office  I discussed the limitations of evaluation and management by telemedicine. The patient expressed understanding and agreed to proceed.  Review of Systems     Cardiac Risk Factors include: advanced age (>75mn, >>58women);male gender;hypertension;dyslipidemia     Objective:    Today's Vitals   01/22/23 0852  Weight: 234 lb (106.1 kg)  Height: 6' 1"$  (1.854 m)   Body mass index is 30.87 kg/m.     01/22/2023    8:58 AM 01/21/2022    9:49 AM  Advanced Directives  Does Patient Have a Medical Advance Directive? No No  Would patient like information on creating a medical advance directive? No - Patient declined No - Patient declined    Current Medications (verified) Outpatient Encounter Medications as of 01/22/2023  Medication Sig   ACCU-CHEK GUIDE test strip CHECK BLOOD SUGAR 3 TIMES A DAY E11.69   Accu-Chek Softclix Lancets lancets CHECK BLOOD SUGAR 3 TIMES DAILY Dx E11.69   albuterol (VENTOLIN HFA) 108 (90 Base) MCG/ACT inhaler TAKE 2 PUFFS BY MOUTH EVERY 4 HOURS AS NEEDED   Blood Glucose Monitoring Suppl (ACCU-CHEK GUIDE) w/Device KIT Check BS 3 times a day Dx E11.69   fluticasone (FLONASE) 50 MCG/ACT nasal spray Place 1 spray into both nostrils 2 (two) times daily as needed for allergies or rhinitis.   Lancets Misc. (ACCU-CHEK SOFTCLIX LANCET DEV) KIT CHECK BLOOD SUGARS 3 TIMES A DAY Dx E11.69   loratadine (CLARITIN) 10 MG tablet Take 10 mg by mouth daily as needed.   metFORMIN (GLUCOPHAGE) 500 MG tablet TAKE 1 TABLET BY MOUTH 2 TIMES DAILY WITH A MEAL.   olmesartan (BENICAR) 20 MG tablet Take 1 tablet (20 mg total) by mouth daily.   omeprazole  (PRILOSEC) 20 MG capsule Take 1 capsule (20 mg total) by mouth daily.   pravastatin (PRAVACHOL) 40 MG tablet Take 1 tablet (40 mg total) by mouth daily.   predniSONE (DELTASONE) 1 MG tablet Take 3 tablets (3 mg total) by mouth daily with breakfast for 21 days, THEN 2 tablets (2 mg total) daily with breakfast for 21 days, THEN 1 tablet (1 mg total) daily with breakfast for 21 days. Then STOP.   roflumilast (DALIRESP) 500 MCG TABS tablet TAKE 1 TABLET BY MOUTH DAILY   sitaGLIPtin (JANUVIA) 100 MG tablet Take 1 tablet (100 mg total) by mouth daily.   SYMBICORT 160-4.5 MCG/ACT inhaler INHALE 2 PUFFS INTO THE LUNGS TWICE A DAY   No facility-administered encounter medications on file as of 01/22/2023.    Allergies (verified) Patient has no known allergies.   History: Past Medical History:  Diagnosis Date   Allergic rhinitis    Asthma    Atypical mycobacterial infection    Bronchiectasis    COPD (chronic obstructive pulmonary disease) (HCC)    Cough    Diabetes mellitus without complication (HCC)    GERD (gastroesophageal reflux disease)    HTN (hypertension)    Pulmonary nodule    Past Surgical History:  Procedure Laterality Date   BASAL CELL CARCINOMA EXCISION     face   Family History  Problem Relation Age of Onset   Emphysema Father    COPD Father    Heart  disease Father    Stroke Father    Heart attack Father    Heart disease Paternal Uncle    Heart disease Maternal Grandfather    Rheum arthritis Mother    Diabetes Mother    Diabetes Brother    Heart attack Brother    Diabetes Brother    Social History   Socioeconomic History   Marital status: Widowed    Spouse name: Not on file   Number of children: 2   Years of education: Not on file   Highest education level: Not on file  Occupational History   Occupation: landscaping    Comment: retired  Tobacco Use   Smoking status: Former    Packs/day: 1.50    Years: 30.00    Total pack years: 45.00    Types:  Cigarettes    Quit date: 07/21/2008    Years since quitting: 14.5   Smokeless tobacco: Never  Vaping Use   Vaping Use: Never used  Substance and Sexual Activity   Alcohol use: No   Drug use: No   Sexual activity: Never  Other Topics Concern   Not on file  Social History Narrative   Wife passed away January 22, 2020 from Covid + pneumonia   Children in [place] Strain: [place]  (01/22/2023)   Overall Financial Resource Strain (CARDIA)    Difficulty of Paying Living Expenses: Not hard at all  Food Insecurity: No Food Insecurity (01/22/2023)   Hunger Vital Sign    Worried About Running Out of Food in the Last Year: Never true    Jennette in the Last Year: Never true  Transportation Needs: No Transportation Needs (01/22/2023)   PRAPARE - Hydrologist (Medical): No    Lack of Transportation (Non-Medical): No  Physical Activity: Sufficiently Active (01/22/2023)   Exercise Vital Sign    Days of Exercise per Week: 7 days    Minutes of Exercise per Session: 30 min  Stress: No Stress Concern Present (01/22/2023)   [place]    Feeling of Stress : Not at all  Social Connections: Socially Isolated (01/22/2023)   Social Connection and Isolation Panel [NHANES]    Frequency of Communication with Friends and Family: More than three times a week    Frequency of Social Gatherings with Friends and Family: More than three times a week    Attends Religious Services: Never    Marine scientist or Organizations: No    Attends Archivist Meetings: Never    Marital Status: Widowed    Tobacco Counseling Counseling given: Not Answered   Clinical Intake:  Pre-visit preparation completed: Yes  Pain : No/denies pain     Nutritional Risks: None Diabetes: No  How often do you need to have someone help you when you read  instructions, pamphlets, or other written materials from your doctor or pharmacy?: 1 - Never  Diabetic?yes Nutrition Risk Assessment:  Has the patient had any N/V/D within the last 2 months?  No  Does the patient have any non-healing wounds?  No  Has the patient had any unintentional weight loss or weight gain?  No   Diabetes:  Is the patient diabetic?  Yes  If diabetic, was a CBG obtained today?  No  Did the patient bring in their glucometer from home?  No  How often do you monitor  your CBG's? daily.   Financial Strains and Diabetes Management:  Are you having any financial strains with the device, your supplies or your medication? No .  Does the patient want to be seen by Chronic Care Management for management of their diabetes?  No  Would the patient like to be referred to a Nutritionist or for Diabetic Management?  No   Diabetic Exams:  Diabetic Eye Exam: Completed 12/2022 Diabetic Foot Exam: Overdue, Pt has been advised about the importance in completing this exam. Pt is scheduled for diabetic foot exam on next office visit .   Interpreter Needed?: No  Information entered by :: Jadene Pierini, LPN   Activities of Daily Living    01/22/2023    8:58 AM  In your present state of health, do you have any difficulty performing the following activities:  Hearing? 0  Vision? 0  Difficulty concentrating or making decisions? 0  Walking or climbing stairs? 0  Dressing or bathing? 0  Doing errands, shopping? 0  Preparing Food and eating ? N  Using the Toilet? N  In the past six months, have you accidently leaked urine? N  Do you have problems with loss of bowel control? N  Managing your Medications? N  Managing your Finances? N  Housekeeping or managing your Housekeeping? N    Patient Care Team: Dettinger, Fransisca Kaufmann, MD as PCP - General (Family Medicine)  Indicate any recent Medical Services you may have received from other than [facility] providers in the past year (date may be  approximate).     Assessment:   This is a routine wellness examination for [place].  Hearing/Vision screen Vision Screening - Comments:: Wears rx glasses - up to date with routine eye exams with  Dr.Johnson   Dietary issues and exercise activities discussed: Current Exercise Habits: Home exercise routine, Type of exercise: walking, Time (Minutes): 30, Frequency (Times/Week): 7, Weekly Exercise (Minutes/Week): 210, Intensity: Mild, Exercise limited by: None identified   Goals Addressed             This Visit's Progress    Patient Stated   On track    Stay healthy and active Increase water       Depression Screen    01/22/2023    8:56 AM 11/23/2022    9:50 AM 08/21/2022    2:20 PM 04/08/2022   10:48 AM 01/21/2022    9:48 AM 01/01/2022   10:06 AM 10/01/2021    9:34 AM  PHQ 2/9 Scores  PHQ - 2 Score 0 0 0 0 0 0 0  PHQ- 9 Score 0 0  0       Fall Risk    01/22/2023    8:53 AM 11/23/2022    9:50 AM 08/21/2022    2:20 PM 04/08/2022   10:48 AM 01/21/2022    9:50 AM  Fall Risk   Falls in the past year? 0 0 0 0 0  Number falls in past yr: 0    0  Injury with Fall? 0    0  Risk for fall due to : No Fall Risks    No Fall Risks  Follow up Falls prevention discussed    Falls prevention discussed    FALL RISK PREVENTION PERTAINING TO THE HOME:  Any stairs in or around the home? No  If so, are there any without handrails? No  Home free of loose throw rugs in walkways, pet beds, electrical cords, etc? Yes  Adequate lighting in your home  to reduce risk of falls? Yes   ASSISTIVE DEVICES UTILIZED TO PREVENT FALLS:  Life alert? No  Use of a cane, walker or w/c? No  Grab bars in the bathroom? No  Shower chair or bench in shower? No  Elevated toilet seat or a handicapped toilet? No        01/22/2023    8:59 AM 01/21/2022    9:54 AM  6CIT Screen  What Year? 0 points 0 points  What month? 0 points 0 points  What time? 0 points 0 points  Count back from 20 0 points 0 points  Months in  reverse 0 points 4 points  Repeat phrase 0 points 2 points  Total Score 0 points 6 points    Immunizations Immunization History  Administered Date(s) Administered   Td 08/16/2007    TDAP status: Due, Education has been provided regarding the importance of this vaccine. Advised may receive this vaccine at local pharmacy or Health Dept. Aware to provide a copy of the vaccination record if obtained from local pharmacy or Health Dept. Verbalized acceptance and understanding.  Flu Vaccine status: Declined, Education has been provided regarding the importance of this vaccine but patient still declined. Advised may receive this vaccine at local pharmacy or Health Dept. Aware to provide a copy of the vaccination record if obtained from local pharmacy or Health Dept. Verbalized acceptance and understanding.  Pneumococcal vaccine status: Due, Education has been provided regarding the importance of this vaccine. Advised may receive this vaccine at local pharmacy or Health Dept. Aware to provide a copy of the vaccination record if obtained from local pharmacy or Health Dept. Verbalized acceptance and understanding.  Covid-19 vaccine status: Completed vaccines  Qualifies for Shingles Vaccine? Yes   Zostavax completed No   Shingrix Completed?: No.    Education has been provided regarding the importance of this vaccine. Patient has been advised to call insurance company to determine out of pocket expense if they have not yet received this vaccine. Advised may also receive vaccine at local pharmacy or Health Dept. Verbalized acceptance and understanding.  Screening Tests Health Maintenance  Topic Date Due   Zoster Vaccines- Shingrix (1 of 2) Never done   DTaP/Tdap/Td (2 - Tdap) 08/15/2017   OPHTHALMOLOGY EXAM  12/04/2022   FOOT EXAM  01/01/2023   Lung Cancer Screening  03/08/2023 (Originally 09/02/2011)   INFLUENZA VACCINE  03/21/2023 (Originally 07/21/2022)   Pneumonia Vaccine 47+ Years old (1 - PCV)  04/09/2023 (Originally 01/08/1962)   COLONOSCOPY (Pts 45-29yr Insurance coverage will need to be confirmed)  03/10/2023   HEMOGLOBIN A1C  05/25/2023   Diabetic kidney evaluation - eGFR measurement  11/24/2023   Diabetic kidney evaluation - Urine ACR  11/25/2023   Medicare Annual Wellness (AWV)  01/23/2024   Hepatitis C Screening  Completed   HPV VACCINES  Aged Out   COVID-19 Vaccine  Discontinued   Fecal DNA (Cologuard)  Discontinued    Health Maintenance  Health Maintenance Due  Topic Date Due   Zoster Vaccines- Shingrix (1 of 2) Never done   DTaP/Tdap/Td (2 - Tdap) 08/15/2017   OPHTHALMOLOGY EXAM  12/04/2022   FOOT EXAM  01/01/2023    Colorectal cancer screening: Type of screening: Colonoscopy. Completed 03/09/2022. Repeat every 10 years  Lung Cancer Screening: (Low Dose CT Chest recommended if Age 66-80years, 30 pack-year currently smoking OR have quit w/in 15years.) does qualify.   Lung Cancer Screening Referral: referral 12/04/2022  Additional Screening:  Hepatitis C Screening: does  not qualify; Completed 12/17/2017  Vision Screening: Recommended annual ophthalmology exams for early detection of glaucoma and other disorders of the eye. Is the patient up to date with their annual eye exam?  Yes  Who is the provider or what is the name of the office in which the patient attends annual eye exams? Dr,Johnson  If pt is not established with a provider, would they like to be referred to a provider to establish care? No .   Dental Screening: Recommended annual dental exams for proper oral hygiene  Community Resource Referral / Chronic Care Management: CRR required this visit?  No   CCM required this visit?  No      Plan:     I have personally reviewed and noted the following in the patient's chart:   Medical and social history Use of alcohol, tobacco or illicit drugs  Current medications and supplements including opioid prescriptions. Patient is not currently taking  opioid prescriptions. Functional ability and status Nutritional status Physical activity Advanced directives List of other physicians Hospitalizations, surgeries, and ER visits in previous 12 months Vitals Screenings to include cognitive, depression, and falls Referrals and appointments  In addition, I have reviewed and discussed with patient certain preventive protocols, quality metrics, and best practice recommendations. A written personalized care plan for preventive services as well as general preventive health recommendations were provided to patient.     Daphane Shepherd, LPN   [id number]   Nurse Notes: Due TDAP/Pneumonia Vaccine

## 2023-01-22 NOTE — Patient Instructions (Signed)
Craig Arnold , Thank you for taking time to come for your Medicare Wellness Visit. I appreciate your ongoing commitment to your health goals. Please review the following plan we discussed and let me know if I can assist you in the future.   These are the goals we discussed:  Goals      Patient Stated     Stay healthy and active Increase water        This is a list of the screening recommended for you and due dates:  Health Maintenance  Topic Date Due   Zoster (Shingles) Vaccine (1 of 2) Never done   DTaP/Tdap/Td vaccine (2 - Tdap) 08/15/2017   Eye exam for diabetics  12/04/2022   Complete foot exam   01/01/2023   Screening for Lung Cancer  03/08/2023*   Flu Shot  03/21/2023*   Pneumonia Vaccine (1 - PCV) 04/09/2023*   Colon Cancer Screening  03/10/2023   Hemoglobin A1C  05/25/2023   Yearly kidney function blood test for diabetes  11/24/2023   Yearly kidney health urinalysis for diabetes  11/25/2023   Medicare Annual Wellness Visit  01/23/2024   Hepatitis C Screening: USPSTF Recommendation to screen - Ages 18-79 yo.  Completed   HPV Vaccine  Aged Out   COVID-19 Vaccine  Discontinued   Cologuard (Stool DNA test)  Discontinued  *Topic was postponed. The date shown is not the original due date.    Advanced directives: .ahad  Conditions/risks identified: Aim for 30 minutes of exercise or brisk walking, 6-8 glasses of water, and 5 servings of fruits and vegetables each day.   Next appointment: Follow up in one year for your annual wellness visit.   Preventive Care 66 Years and Older, Male  Preventive care refers to lifestyle choices and visits with your health care provider that can promote health and wellness. What does preventive care include? A yearly physical exam. This is also called an annual well check. Dental exams once or twice a year. Routine eye exams. Ask your health care provider how often you should have your eyes checked. Personal lifestyle choices,  including: Daily care of your teeth and gums. Regular physical activity. Eating a healthy diet. Avoiding tobacco and drug use. Limiting alcohol use. Practicing safe sex. Taking low doses of aspirin every day. Taking vitamin and mineral supplements as recommended by your health care provider. What happens during an annual well check? The services and screenings done by your health care provider during your annual well check will depend on your age, overall health, lifestyle risk factors, and family history of disease. Counseling  Your health care provider may ask you questions about your: Alcohol use. Tobacco use. Drug use. Emotional well-being. Home and relationship well-being. Sexual activity. Eating habits. History of falls. Memory and ability to understand (cognition). Work and work Statistician. Screening  You may have the following tests or measurements: Height, weight, and BMI. Blood pressure. Lipid and cholesterol levels. These may be checked every 5 years, or more frequently if you are over 47 years old. Skin check. Lung cancer screening. You may have this screening every year starting at age 57 if you have a 30-pack-year history of smoking and currently smoke or have quit within the past 15 years. Fecal occult blood test (FOBT) of the stool. You may have this test every year starting at age 26. Flexible sigmoidoscopy or colonoscopy. You may have a sigmoidoscopy every 5 years or a colonoscopy every 10 years starting at age 30. Prostate cancer  screening. Recommendations will vary depending on your family history and other risks. Hepatitis C blood test. Hepatitis B blood test. Sexually transmitted disease (STD) testing. Diabetes screening. This is done by checking your blood sugar (glucose) after you have not eaten for a while (fasting). You may have this done every 1-3 years. Abdominal aortic aneurysm (AAA) screening. You may need this if you are a current or former  smoker. Osteoporosis. You may be screened starting at age 71 if you are at high risk. Talk with your health care provider about your test results, treatment options, and if necessary, the need for more tests. Vaccines  Your health care provider may recommend certain vaccines, such as: Influenza vaccine. This is recommended every year. Tetanus, diphtheria, and acellular pertussis (Tdap, Td) vaccine. You may need a Td booster every 10 years. Zoster vaccine. You may need this after age 20. Pneumococcal 13-valent conjugate (PCV13) vaccine. One dose is recommended after age 48. Pneumococcal polysaccharide (PPSV23) vaccine. One dose is recommended after age 21. Talk to your health care provider about which screenings and vaccines you need and how often you need them. This information is not intended to replace advice given to you by your health care provider. Make sure you discuss any questions you have with your health care provider. Document Released: 01/03/2016 Document Revised: 08/26/2016 Document Reviewed: 10/08/2015 Elsevier Interactive Patient Education  2017 [employer] Prevention in the Home Falls can cause injuries. They can happen to people of all ages. There are many things you can do to make your home safe and to help prevent falls. What can I do on the outside of my home? Regularly fix the edges of walkways and driveways and fix any cracks. Remove anything that might make you trip as you walk through a door, such as a raised step or threshold. Trim any bushes or trees on the path to your home. Use bright outdoor lighting. Clear any walking paths of anything that might make someone trip, such as rocks or tools. Regularly check to see if handrails are loose or broken. Make sure that both sides of any steps have handrails. Any raised decks and porches should have guardrails on the edges. Have any leaves, snow, or ice cleared regularly. Use sand or salt on walking paths during  winter. Clean up any spills in your garage right away. This includes oil or grease spills. What can I do in the bathroom? Use night lights. Install grab bars by the toilet and in the tub and shower. Do not use towel bars as grab bars. Use non-skid mats or decals in the tub or shower. If you need to sit down in the shower, use a plastic, non-slip stool. Keep the floor dry. Clean up any water that spills on the floor as soon as it happens. Remove soap buildup in the tub or shower regularly. Attach bath mats securely with double-sided non-slip rug tape. Do not have throw rugs and other things on the floor that can make you trip. What can I do in the bedroom? Use night lights. Make sure that you have a light by your bed that is easy to reach. Do not use any sheets or blankets that are too big for your bed. They should not hang down onto the floor. Have a firm chair that has side arms. You can use this for support while you get dressed. Do not have throw rugs and other things on the floor that can make you trip. What can  I do in the kitchen? Clean up any spills right away. Avoid walking on wet floors. Keep items that you use a lot in easy-to-reach places. If you need to reach something above you, use a strong step stool that has a grab bar. Keep electrical cords out of the way. Do not use floor polish or wax that makes floors slippery. If you must use wax, use non-skid floor wax. Do not have throw rugs and other things on the floor that can make you trip. What can I do with my stairs? Do not leave any items on the stairs. Make sure that there are handrails on both sides of the stairs and use them. Fix handrails that are broken or loose. Make sure that handrails are as long as the stairways. Check any carpeting to make sure that it is firmly attached to the stairs. Fix any carpet that is loose or worn. Avoid having throw rugs at the top or bottom of the stairs. If you do have throw rugs,  attach them to the floor with carpet tape. Make sure that you have a light switch at the top of the stairs and the bottom of the stairs. If you do not have them, ask someone to add them for you. What else can I do to help prevent falls? Wear shoes that: Do not have high heels. Have rubber bottoms. Are comfortable and fit you well. Are closed at the toe. Do not wear sandals. If you use a stepladder: Make sure that it is fully opened. Do not climb a closed stepladder. Make sure that both sides of the stepladder are locked into place. Ask someone to hold it for you, if possible. Clearly mark and make sure that you can see: Any grab bars or handrails. First and last steps. Where the edge of each step is. Use tools that help you move around (mobility aids) if they are needed. These include: Canes. Walkers. Scooters. Crutches. Turn on the lights when you go into a dark area. Replace any light bulbs as soon as they burn out. Set up your furniture so you have a clear path. Avoid moving your furniture around. If any of your floors are uneven, fix them. If there are any pets around you, be aware of where they are. Review your medicines with your doctor. Some medicines can make you feel dizzy. This can increase your chance of falling. Ask your doctor what other things that you can do to help prevent falls. This information is not intended to replace advice given to you by your health care provider. Make sure you discuss any questions you have with your health care provider. Document Released: 10/03/2009 Document Revised: 05/14/2016 Document Reviewed: 01/11/2015 Elsevier Interactive Patient Education  2017 [employer].

## 2023-01-25 ENCOUNTER — Other Ambulatory Visit: Payer: Self-pay | Admitting: Emergency Medicine

## 2023-01-26 ENCOUNTER — Other Ambulatory Visit: Payer: Self-pay

## 2023-01-26 DIAGNOSIS — Z122 Encounter for screening for malignant neoplasm of respiratory organs: Secondary | ICD-10-CM

## 2023-01-26 DIAGNOSIS — Z87891 Personal history of nicotine dependence: Secondary | ICD-10-CM

## 2023-02-24 ENCOUNTER — Ambulatory Visit (INDEPENDENT_AMBULATORY_CARE_PROVIDER_SITE_OTHER): Payer: Medicare Other | Admitting: Family Medicine

## 2023-02-24 ENCOUNTER — Encounter: Payer: Self-pay | Admitting: Family Medicine

## 2023-02-24 VITALS — BP 137/84 | HR 94 | Ht 73.0 in | Wt 225.0 lb

## 2023-02-24 DIAGNOSIS — I152 Hypertension secondary to endocrine disorders: Secondary | ICD-10-CM

## 2023-02-24 DIAGNOSIS — E1169 Type 2 diabetes mellitus with other specified complication: Secondary | ICD-10-CM

## 2023-02-24 DIAGNOSIS — J449 Chronic obstructive pulmonary disease, unspecified: Secondary | ICD-10-CM | POA: Diagnosis not present

## 2023-02-24 DIAGNOSIS — E1159 Type 2 diabetes mellitus with other circulatory complications: Secondary | ICD-10-CM

## 2023-02-24 LAB — BAYER DCA HB A1C WAIVED: HB A1C (BAYER DCA - WAIVED): 6.7 % — ABNORMAL HIGH (ref 4.8–5.6)

## 2023-02-24 MED ORDER — ACCU-CHEK GUIDE VI STRP: ORAL_STRIP | 3 refills | Status: DC

## 2023-02-24 MED ORDER — METFORMIN HCL 500 MG PO TABS: 500.0000 mg | ORAL_TABLET | Freq: Two times a day (BID) | ORAL | 3 refills | Status: DC

## 2023-02-24 NOTE — Progress Notes (Signed)
BP 137/84   Pulse 94   Ht '6\' 1"'$  (1.854 m)   Wt 102.1 kg   SpO2 97%   BMI 29.69 kg/m    Subjective:   Patient ID: Craig Arnold, male    DOB: 1956-09-29, 67 y.o.   MRN: [id number]  HPI: Craig Arnold is a 67 y.o. male presenting on 02/24/2023 for Medical Management of Chronic Issues and Diabetes  Type 2 diabetes mellitus Patient comes in today for recheck of their Type 2 Diabetes Mellitus. Patient's current medications include Metformin 500 mg PO BID. Patient is currently taking an ACE inhibitor/ARB. Patient has seen an ophthalmologist this year. Patient's last ophthalmology exam was 1 month ago. Patient denies any changes in vision. Patient does not have a podiatrist. Patient reports burning pain in his medial foot from time to time. Patient reports eating sugar-free desserts for snacks and one biscuit every morning.The symptom started onset as an adult. Hypertension is related to Type 2 Diabetes Mellitus.    Patient reports weight loss over the past few months, most recently 8-9 lb loss in the last month without any changes in diet or physical activity other than reducing sugar and carbohydrate intake. Patient denies night sweats.   Hypertension Patient is coming in for a recheck of their hypertension. Patient is currently taking Olmesartan. Patient's blood pressure today is 137/74. Patient has been taking their blood pressure at home occasionally but does not remember the numbers. Patient denies lightheadedness, dizziness, weakness, headaches, blurred vision, chest pains, and shortness of breath. Patient denies any side effects from medication(s) and is content with current medication(s).   COPD Patient is coming in for COPD recheck today. Patient is currently taking Symbicort, Albuterol, and Daliresp. Patient has been using inhalers as needed for symptoms.  Patient states he has had a cold present for a month but denies any major coughing or wheezing spells. Patient has been using Mucinex  DM with some relief. Patient states he sees a lung specialist and the specialist does not want him to use any steroids.  Relevant past medical, surgical, family and social history were reviewed and updated as indicated. Interim medical history were reviewed. Allergies and medications were reviewed and updated.  Review of Systems  Constitutional:  Positive for unexpected weight change. Negative for activity change, appetite change, chills and fever.  HENT:  Positive for congestion.   Eyes:  Negative for visual disturbance.  Respiratory:  Positive for wheezing. Negative for chest tightness and shortness of breath.   Cardiovascular:  Negative for chest pain and leg swelling.  Gastrointestinal:  Negative for abdominal pain.  Musculoskeletal:  Negative for back pain and gait problem.  Skin:  Negative for rash.  Neurological:  Negative for dizziness, weakness, light-headedness, numbness and headaches.  Psychiatric/Behavioral:  Negative for behavioral problems.   All other systems reviewed and are negative.   Per HPI unless specifically indicated above.   Allergies as of 02/24/2023   No Known Allergies      Medication List        Accurate as of February 24, 2023 11:15 AM. If you have any questions, ask your nurse or doctor.          STOP taking these medications    predniSONE 1 MG tablet Commonly known as: DELTASONE Stopped by: Fransisca Kaufmann Annaya Bangert, MD       TAKE these medications    Accu-Chek Guide test strip Generic drug: glucose blood Use as instructed What changed: See the new instructions. Changed by:  Fransisca Kaufmann Naziya Hegwood, MD   Accu-Chek Guide w/Device Kit Check BS 3 times a day Dx E11.69   Accu-Chek Softclix Lancet Dev Kit CHECK BLOOD SUGARS 3 TIMES A DAY Dx E11.69   Accu-Chek Softclix Lancets lancets CHECK BLOOD SUGAR 3 TIMES DAILY Dx E11.69   albuterol 108 (90 Base) MCG/ACT inhaler Commonly known as: VENTOLIN HFA TAKE 2 PUFFS BY MOUTH EVERY 4 HOURS AS NEEDED    fluticasone 50 MCG/ACT nasal spray Commonly known as: FLONASE Place 1 spray into both nostrils 2 (two) times daily as needed for allergies or rhinitis.   loratadine 10 MG tablet Commonly known as: CLARITIN Take 10 mg by mouth daily as needed.   metFORMIN 500 MG tablet Commonly known as: GLUCOPHAGE Take 1 tablet (500 mg total) by mouth 2 (two) times daily with a meal.   olmesartan 20 MG tablet Commonly known as: BENICAR Take 1 tablet (20 mg total) by mouth daily.   omeprazole 20 MG capsule Commonly known as: PRILOSEC Take 1 capsule (20 mg total) by mouth daily.   pravastatin 40 MG tablet Commonly known as: PRAVACHOL Take 1 tablet (40 mg total) by mouth daily.   roflumilast 500 MCG Tabs tablet Commonly known as: DALIRESP TAKE 1 TABLET BY MOUTH DAILY   sitaGLIPtin 100 MG tablet Commonly known as: Januvia Take 1 tablet (100 mg total) by mouth daily.   Symbicort 160-4.5 MCG/ACT inhaler Generic drug: budesonide-formoterol INHALE 2 PUFFS INTO THE LUNGS TWICE A DAY         Objective:   BP 137/84   Pulse 94   Ht '6\' 1"'$  (1.854 m)   Wt 102.1 kg   SpO2 97%   BMI 29.69 kg/m   Wt Readings from Last 3 Encounters:  02/24/23 102.1 kg  01/22/23 106.1 kg  12/04/22 105.8 kg    Physical Exam Vitals reviewed.  Constitutional:      Appearance: Normal appearance.  Eyes:     General: No scleral icterus.    Conjunctiva/sclera: Conjunctivae normal.  Neck:     Thyroid: No thyromegaly.  Cardiovascular:     Rate and Rhythm: Normal rate and regular rhythm.     Pulses: Normal pulses.     Heart sounds: Normal heart sounds.  Pulmonary:     Effort: Pulmonary effort is normal.     Breath sounds: Wheezing (expiratory) present. No rhonchi or rales.  Abdominal:     Tenderness: There is no right CVA tenderness or left CVA tenderness.  Musculoskeletal:     Cervical back: Neck supple. No tenderness.     Right lower leg: No edema.     Left lower leg: No edema.  Feet:     Right  foot:     Skin integrity: Callus present.     Comments: b/t 4th and 5th toes Lymphadenopathy:     Cervical: No cervical adenopathy.  Skin:    General: Skin is warm and dry.     Findings: No rash.  Neurological:     Mental Status: He is alert and oriented to person, place, and time.  Psychiatric:        Mood and Affect: Mood normal.        Behavior: Behavior normal.     Assessment & Plan:   Problem List Items Addressed This Visit       Cardiovascular and Mediastinum   Hypertension associated with diabetes ([place])   Relevant Medications   metFORMIN (GLUCOPHAGE) 500 MG tablet     Respiratory   COPD (  chronic obstructive pulmonary disease) (HCC)     Endocrine   Type 2 diabetes mellitus with other specified complication (HCC) - Primary   Relevant Medications   metFORMIN (GLUCOPHAGE) 500 MG tablet   Other Relevant Orders   Bayer DCA Hb A1c Waived (Completed)    Patient has a callus present in between his 4th and 5th digits of his right foot. Patient was encouraged to see a podiatrist and check his feet daily.   A1c is 6.7 today. Continue all current medications.  Since patient's lung specialist prefers patient not to take steroids, patient will continue Mucinex DM and inhaler usage as needed. Patient was instructed to follow up with his lung specialist if symptoms worsen. Patient has already made an appointment for lung cancer screening.  Follow up plan: Return in about 3 months (around 05/27/2023), or if symptoms worsen or fail to improve, for Diabetes recheck.  Counseling provided for all of the vaccine components Orders Placed This Encounter  Procedures   Bayer [facility] Hb A1c [facility], PA-S2 [place] 02/24/2023, 11:15 AM  I was personally present for all components of the history, physical exam and/or medical decision making.  I agree with the documentation performed by the PA student and agree with assessment and plan above.  PA student     Vonna Kotyk  Damarco [facility] 02/24/2023, 11:15 AM

## 2023-02-27 ENCOUNTER — Other Ambulatory Visit: Payer: Self-pay | Admitting: Emergency Medicine

## 2023-03-09 ENCOUNTER — Ambulatory Visit: Payer: Medicare Other | Admitting: Emergency Medicine

## 2023-03-09 ENCOUNTER — Encounter: Payer: Self-pay | Admitting: Emergency Medicine

## 2023-03-09 VITALS — BP 134/76 | HR 98 | Temp 98.4°F | Ht 73.0 in | Wt 227.8 lb

## 2023-03-09 DIAGNOSIS — R5383 Other fatigue: Secondary | ICD-10-CM | POA: Diagnosis not present

## 2023-03-09 DIAGNOSIS — R918 Other nonspecific abnormal finding of lung field: Secondary | ICD-10-CM | POA: Diagnosis not present

## 2023-03-09 DIAGNOSIS — J449 Chronic obstructive pulmonary disease, unspecified: Secondary | ICD-10-CM | POA: Diagnosis not present

## 2023-03-09 MED ORDER — PREDNISONE 1 MG PO TABS: 2.0000 mg | ORAL_TABLET | Freq: Every day | ORAL | 0 refills | Status: DC

## 2023-03-09 NOTE — Assessment & Plan Note (Signed)
He has not been using his Symbicort reliably.  He came off of it for cost reasons, was landing in the donut hole.  He felt that he was able to do without it.  Now he is having more wheezing, more dyspnea.  This after we wean his chronic prednisone to off.  I have asked him to try getting back on the Symbicort on a schedule.

## 2023-03-09 NOTE — Assessment & Plan Note (Signed)
Getting back into the lung cancer screening program.  He quit smoking 14 years ago so he should be able to get at least 1 scan done before he no longer qualifies.

## 2023-03-09 NOTE — Patient Instructions (Addendum)
Try to get back on her Symbicort 2 puffs twice a day if at all possible. Keep your albuterol available to use 2 puffs if needed for shortness of breath, chest tightness, wheezing. Please restart prednisone 2 mg once daily.  Keep track of how your energy level does on this dose.  Please call us next week to let us know how you are doing.  We may need to change the prednisone dose further or do laboratory work to evaluate further for adrenal insufficiency Continue Daliresp and omeprazole as you have been taking them Follow with T Parrett in 2 weeks Follow with Dr. Lamonte Sakai in 2 months or sooner if you have any problems.

## 2023-03-09 NOTE — Progress Notes (Signed)
HPI:  ROV 12/04/22 --Davari follows up today for his history of COPD with asthmatic features, bronchiectasis with remote Mycobacterium avium, chronic cough and upper airway irritation syndrome, chronic rhinitis.  He has been on daily prednisone.  At his last visit we tried going from 10 mg daily to 5 mg daily.  He previously was on Symbicort as his maintenance but was able to stop it and did not seem to miss it.  He remains on Daliresp, omeprazole.  He reports today that he feels the same, no worse on lower dose pred. Minimal albuterol use, sometimes at night for wheeze.   He quit smoking 14 yrs ago, is interested in a screening Ct chest.   ROV 03/09/23 --pleasant 67 year old gentleman with history of COPD and asthmatic features, bronchiectasis with remote Mycobacterium avium, chronic cough, chronic rhinitis.  He was able to come off of his Symbicort (stopped it really because of cost and poor insurance coverage). He still has some Symbicort and uses it intermittently.  He has been on maintenance prednisone and we started a slow taper in December.  He was able to wean to off. He has had decreased energy, wants to sleep all the time. He did have a URI recently.  We decided to refer him back to the lung cancer screening program at our last visit, next CT scan is still pending.  He stopped smoking 14 years ago.    EXAM:  Vitals:   03/09/23 1538  BP: 134/76  Pulse: 98  Temp: 98.4 F (36.9 C)  TempSrc: Oral  SpO2: 97%  Weight: 227 lb 12.8 oz (103.3 kg)  Height: 6\' 1"  (1.854 m)    Gen: Pleasant, obese, in no distress,  normal affect, stutters  ENT: No lesions,  mouth clear,  oropharynx clear, no postnasal drip  Neck: No JVD, no Stridor  Lungs: Mostly clear.  No wheezing.  Soft end-exp wheeze  Cardiovascular: RRR, heart sounds normal, no murmur or gallops, no peripheral edema  Musculoskeletal: No deformities, no cyanosis or clubbing  Neuro: alert, non focal  Skin: Warm, no lesions or  rashes   COPD (chronic obstructive pulmonary disease) (HCC) He has not been using his Symbicort reliably.  He came off of it for cost reasons, was landing in the donut hole.  He felt that he was able to do without it.  Now he is having more wheezing, more dyspnea.  This after we wean his chronic prednisone to off.  I have asked him to try getting back on the Symbicort on a schedule.  Pulmonary nodules Getting back into the lung cancer screening program.  He quit smoking 14 years ago so he should be able to get at least 1 scan done before he no longer qualifies.  Fatigue Concerning since he just weaned off the prednisone.  Started to notice some fatigue as he was making his way to 1 mg daily.  He also had a URI at that time, questions whether that may have been a factor.  I am concerned about adrenal insufficiency.  I will put him back on prednisone 2 mg daily to see if he bounces back quickly.  If so then this is probably the diagnosis.  If he remains fatigued we will either uptitrate or do more formal testing with an a.m. cortisol.  He will call me next week to let me know how he is feeling.  I will have him see T Parrett 2 weeks so that we can track closely   Time  spent 42 minutes  Baltazar Apo, MD, PhD 03/09/2023, 4:45 PM [facility] [phone] or if no answer [phone]

## 2023-03-09 NOTE — Assessment & Plan Note (Signed)
Concerning since he just weaned off the prednisone.  Started to notice some fatigue as he was making his way to 1 mg daily.  He also had a URI at that time, questions whether that may have been a factor.  I am concerned about adrenal insufficiency.  I will put him back on prednisone 2 mg daily to see if he bounces back quickly.  If so then this is probably the diagnosis.  If he remains fatigued we will either uptitrate or do more formal testing with an a.m. cortisol.  He will call me next week to let me know how he is feeling.  I will have him see T Parrett 2 weeks so that we can track closely

## 2023-03-10 ENCOUNTER — Other Ambulatory Visit: Payer: Self-pay | Admitting: Emergency Medicine

## 2023-03-16 ENCOUNTER — Encounter: Payer: Self-pay | Admitting: Acute Care

## 2023-03-16 ENCOUNTER — Ambulatory Visit (INDEPENDENT_AMBULATORY_CARE_PROVIDER_SITE_OTHER): Payer: Medicare Other | Admitting: Acute Care

## 2023-03-16 ENCOUNTER — Telehealth: Payer: Self-pay | Admitting: Family Medicine

## 2023-03-16 DIAGNOSIS — Z87891 Personal history of nicotine dependence: Secondary | ICD-10-CM | POA: Diagnosis not present

## 2023-03-16 NOTE — Patient Instructions (Signed)
Thank you for participating in the [facility] Screening [facility]. It was our pleasure to meet you today. We will call you with the results of your scan within the next few days. Your scan will be assigned a Lung RADS category score by the physicians reading the scans.  This Lung RADS score determines follow up scanning.  See below for description of categories, and follow up screening recommendations. We will be in touch to schedule your follow up screening annually or based on recommendations of our providers. We will fax a copy of your scan results to your Primary Care Physician, or the physician who referred you to the program, to ensure they have the results. Please call the office if you have any questions or concerns regarding your scanning experience or results.  Our office number is [phone]. Please speak with Denise Phelps, RN. , or  Denise Buckner RN, They are  our Lung Cancer Screening RN.'s If They are unavailable when you call, Please leave a message on the voice mail. We will return your call at our earliest convenience.This voice mail is monitored several times a day.  Remember, if your scan is normal, we will scan you annually as long as you continue to meet the criteria for the program. (Age 50-80, Current smoker or smoker who has quit within the last 15 years). If you are a smoker, remember, quitting is the single most powerful action that you can take to decrease your risk of lung cancer and other pulmonary, breathing related problems. We know quitting is hard, and we are here to help.  Please let us know if there is anything we can do to help you meet your goal of quitting. If you are a former smoker, congratulations. We are proud of you! Remain smoke free! Remember you can refer friends or family members through the number above.  We will screen them to make sure they meet criteria for the program. Thank you for helping us take better care of you by  participating in Lung Screening.  You can receive free nicotine replacement therapy ( patches, gum or mints) by calling 1-800-QUIT NOW. Please call so we can get you on the path to becoming  a non-smoker. I know it is hard, but you can do this!  Lung RADS Categories:  Lung RADS 1: no nodules or definitely non-concerning nodules.  Recommendation is for a repeat annual scan in 12 months.  Lung RADS 2:  nodules that are non-concerning in appearance and behavior with a very low likelihood of becoming an active cancer. Recommendation is for a repeat annual scan in 12 months.  Lung RADS 3: nodules that are probably non-concerning , includes nodules with a low likelihood of becoming an active cancer.  Recommendation is for a 6-month repeat screening scan. Often noted after an upper respiratory illness. We will be in touch to make sure you have no questions, and to schedule your 6-month scan.  Lung RADS 4 A: nodules with concerning findings, recommendation is most often for a follow up scan in 3 months or additional testing based on our provider's assessment of the scan. We will be in touch to make sure you have no questions and to schedule the recommended 3 month follow up scan.  Lung RADS 4 B:  indicates findings that are concerning. We will be in touch with you to schedule additional diagnostic testing based on our provider's  assessment of the scan.  Other options for assistance in smoking cessation (   As covered by your insurance benefits)  Hypnosis for smoking cessation  [employer] [phone]  Acupuncture for smoking cessation  [facility] [phone]   

## 2023-03-16 NOTE — Progress Notes (Signed)
Virtual Visit via Telephone Note  I connected with Craig Arnold on 03/16/23 at  2:00 PM EDT by telephone and verified that I am speaking with the correct person using two identifiers.  Location: Patient:  At home Provider: [place], [place], [place], [address]    I discussed the limitations, risks, security and privacy concerns of performing an evaluation and management service by telephone and the availability of in person appointments. I also discussed with the patient that there may be a patient responsible charge related to this service. The patient expressed understanding and agreed to proceed.   Shared Decision Making Visit Lung Cancer Screening Program [phone])   Eligibility: Age 67 y.o. Pack Years Smoking History Calculation 40 pack year smoking history (# packs/per year x # years smoked) Recent History of coughing up blood  no Unexplained weight loss? no ( >Than 15 pounds within the last 6 months ) Prior History Lung / other cancer no (Diagnosis within the last 5 years already requiring surveillance chest CT Scans). Smoking Status Former Smoker Former Smokers: Years since quit: 14 years  Quit Date: 07/21/2008)  Visit Components: Discussion included one or more decision making aids. yes Discussion included risk/benefits of screening. yes Discussion included potential follow up diagnostic testing for abnormal scans. yes Discussion included meaning and risk of over diagnosis. yes Discussion included meaning and risk of False Positives. yes Discussion included meaning of total radiation exposure. yes  Counseling Included: Importance of adherence to annual lung cancer LDCT screening. yes Impact of comorbidities on ability to participate in the program. yes Ability and willingness to under diagnostic treatment. yes  Smoking Cessation Counseling: Current Smokers:  Discussed importance of smoking cessation. yes Information about tobacco cessation classes and  interventions provided to patient. yes Patient provided with "ticket" for LDCT Scan. yes Symptomatic Patient. no  Counseling NA Diagnosis Code: Tobacco Use Z72.0 Asymptomatic Patient yes  Counseling (Intermediate counseling: > three minutes counseling) [id number] Former Smokers:  Discussed the importance of maintaining cigarette abstinence. yes Diagnosis Code: Personal History of Nicotine Dependence. [id number] Information about tobacco cessation classes and interventions provided to patient. Yes Patient provided with "ticket" for LDCT Scan. yes Written Order for Lung Cancer Screening with LDCT placed in Epic. Yes (CT Chest Lung Cancer Screening Low Dose W/O CM) [id number] Z12.2-Screening of respiratory organs Z87.891-Personal history of nicotine dependence  I spent 25 minutes of face to face time/virtual visit time  with  Craig Arnold discussing the risks and benefits of lung cancer screening. We took the time to pause the power point at intervals to allow for questions to be asked and answered to ensure understanding. We discussed that he had taken the single most powerful action possible to decrease his risk of developing lung cancer when he quit smoking. I counseled him to remain smoke free, and to contact me if he ever had the desire to smoke again so that I can provide resources and tools to help support the effort to remain smoke free. We discussed the time and location of the scan, and that either  Craig Glassman RN, Craig Prince, RN or I  or I will call / send a letter with the results within  24-72 hours of receiving them. He has the office contact information in the event he needs to speak with me,  Patient  verbalized understanding of all of the above and had no further questions upon leaving the office.     I explained to the patient that there has  been a high incidence of coronary artery disease noted on these exams. I explained that this is a non-gated exam therefore degree or severity cannot  be determined. This patient is on statin therapy. I have asked the patient to follow-up with their PCP regarding any incidental finding of coronary artery disease and management with diet or medication as they feel is clinically indicated. The patient verbalized understanding of the above and had no further questions.     Magdalen Spatz, NP 03/16/2023

## 2023-03-16 NOTE — Telephone Encounter (Signed)
Patient wants an appt with Almyra Free

## 2023-03-17 ENCOUNTER — Ambulatory Visit (HOSPITAL_COMMUNITY)
Admission: RE | Admit: 2023-03-17 | Discharge: 2023-03-17 | Disposition: A | Discharge status: Home / Self Care | Payer: Medicare Other | Source: Ambulatory Visit | Attending: Family Medicine | Admitting: Family Medicine

## 2023-03-17 DIAGNOSIS — Z87891 Personal history of nicotine dependence: Secondary | ICD-10-CM

## 2023-03-17 DIAGNOSIS — Z122 Encounter for screening for malignant neoplasm of respiratory organs: Secondary | ICD-10-CM | POA: Diagnosis not present

## 2023-03-17 IMAGING — DX DG CHEST 2V
3 series · 3 of 3 positions shown · non-contrast
Comparison: 03/24/2018 chest radiograph.

CLINICAL DATA: Weight loss, cough, 45 pack-year smoking history

EXAM:
CHEST - 2 VIEW

[chest pa (1 of 2)]
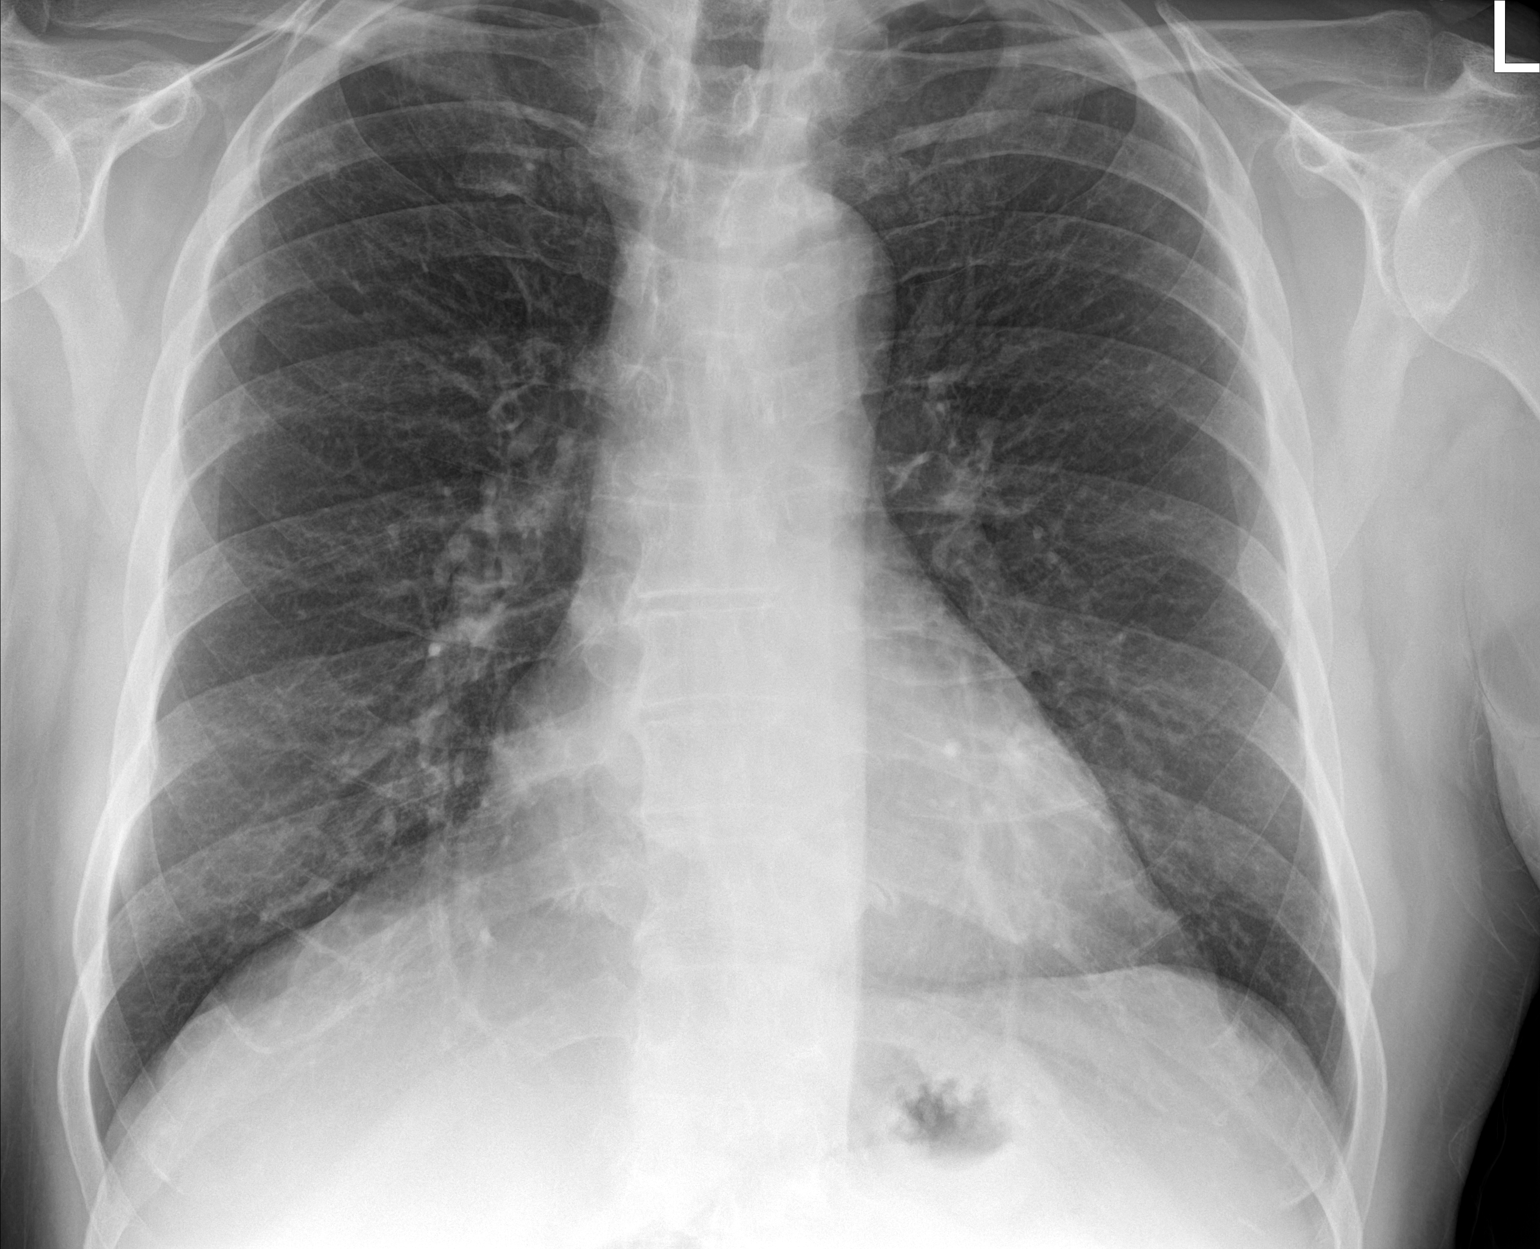

[chest pa (2 of 2)]
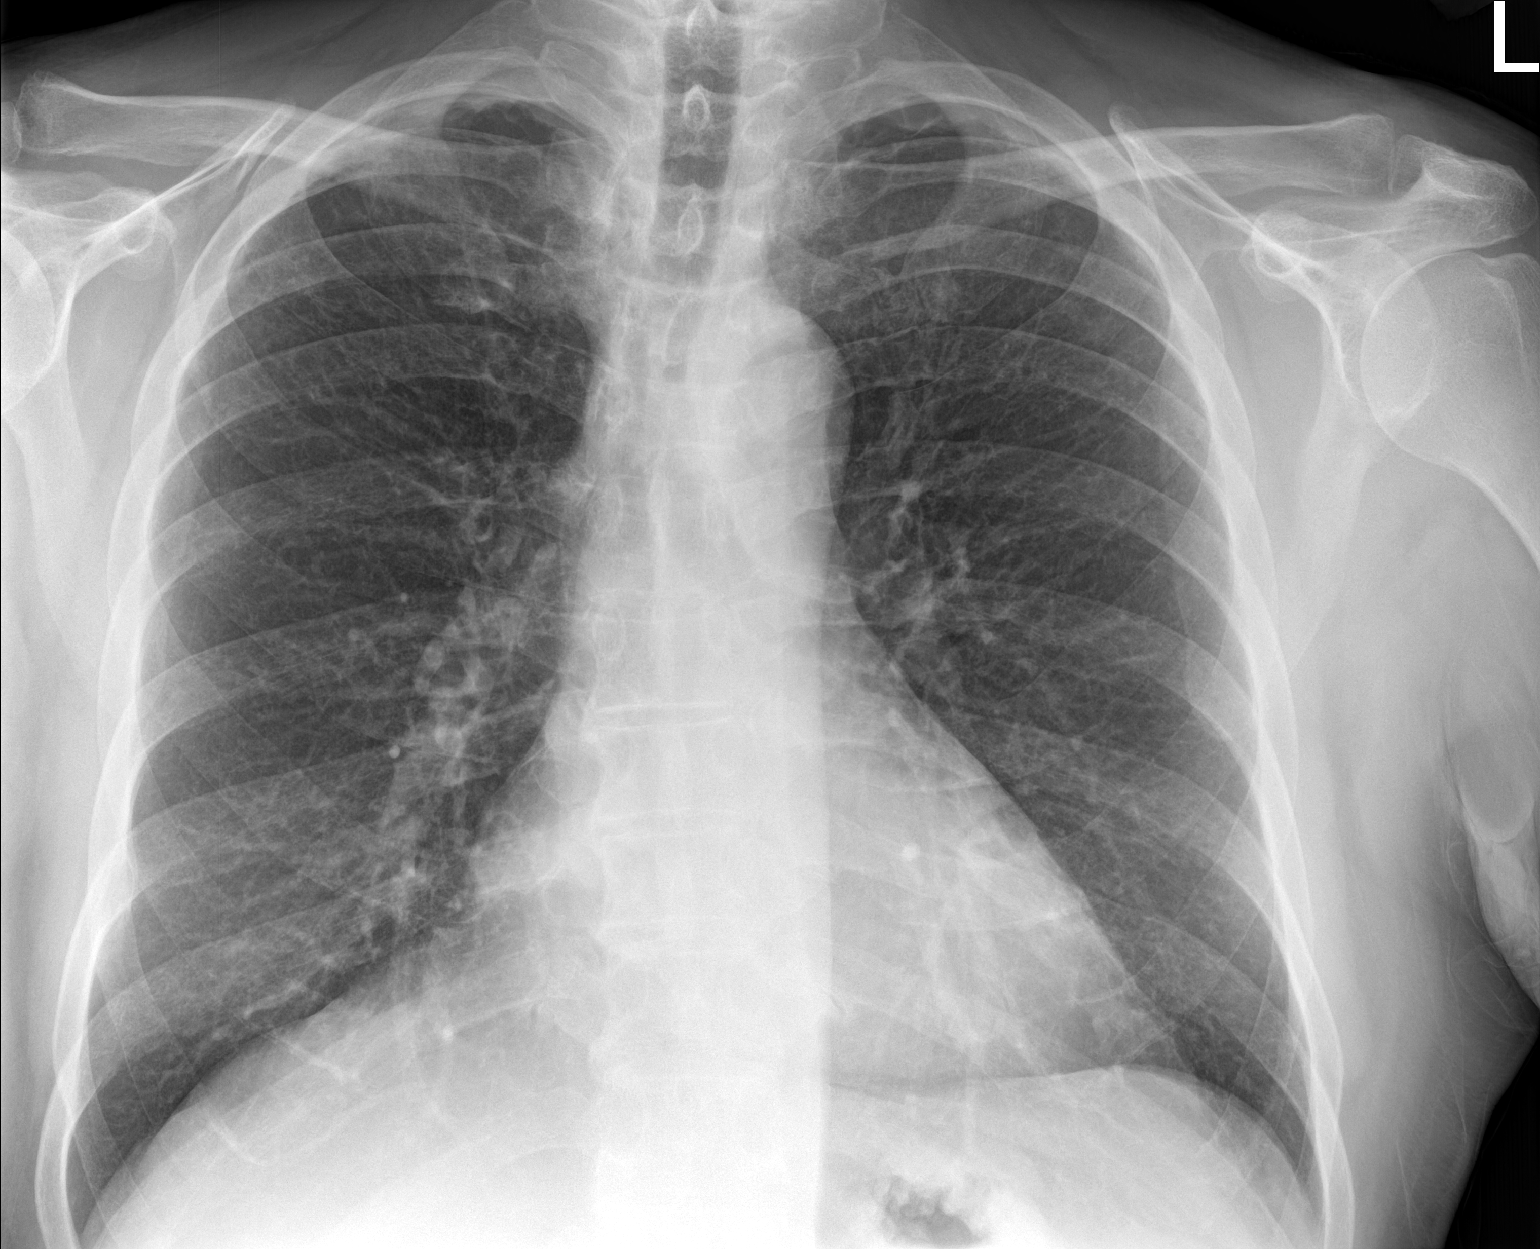

[chest lat]
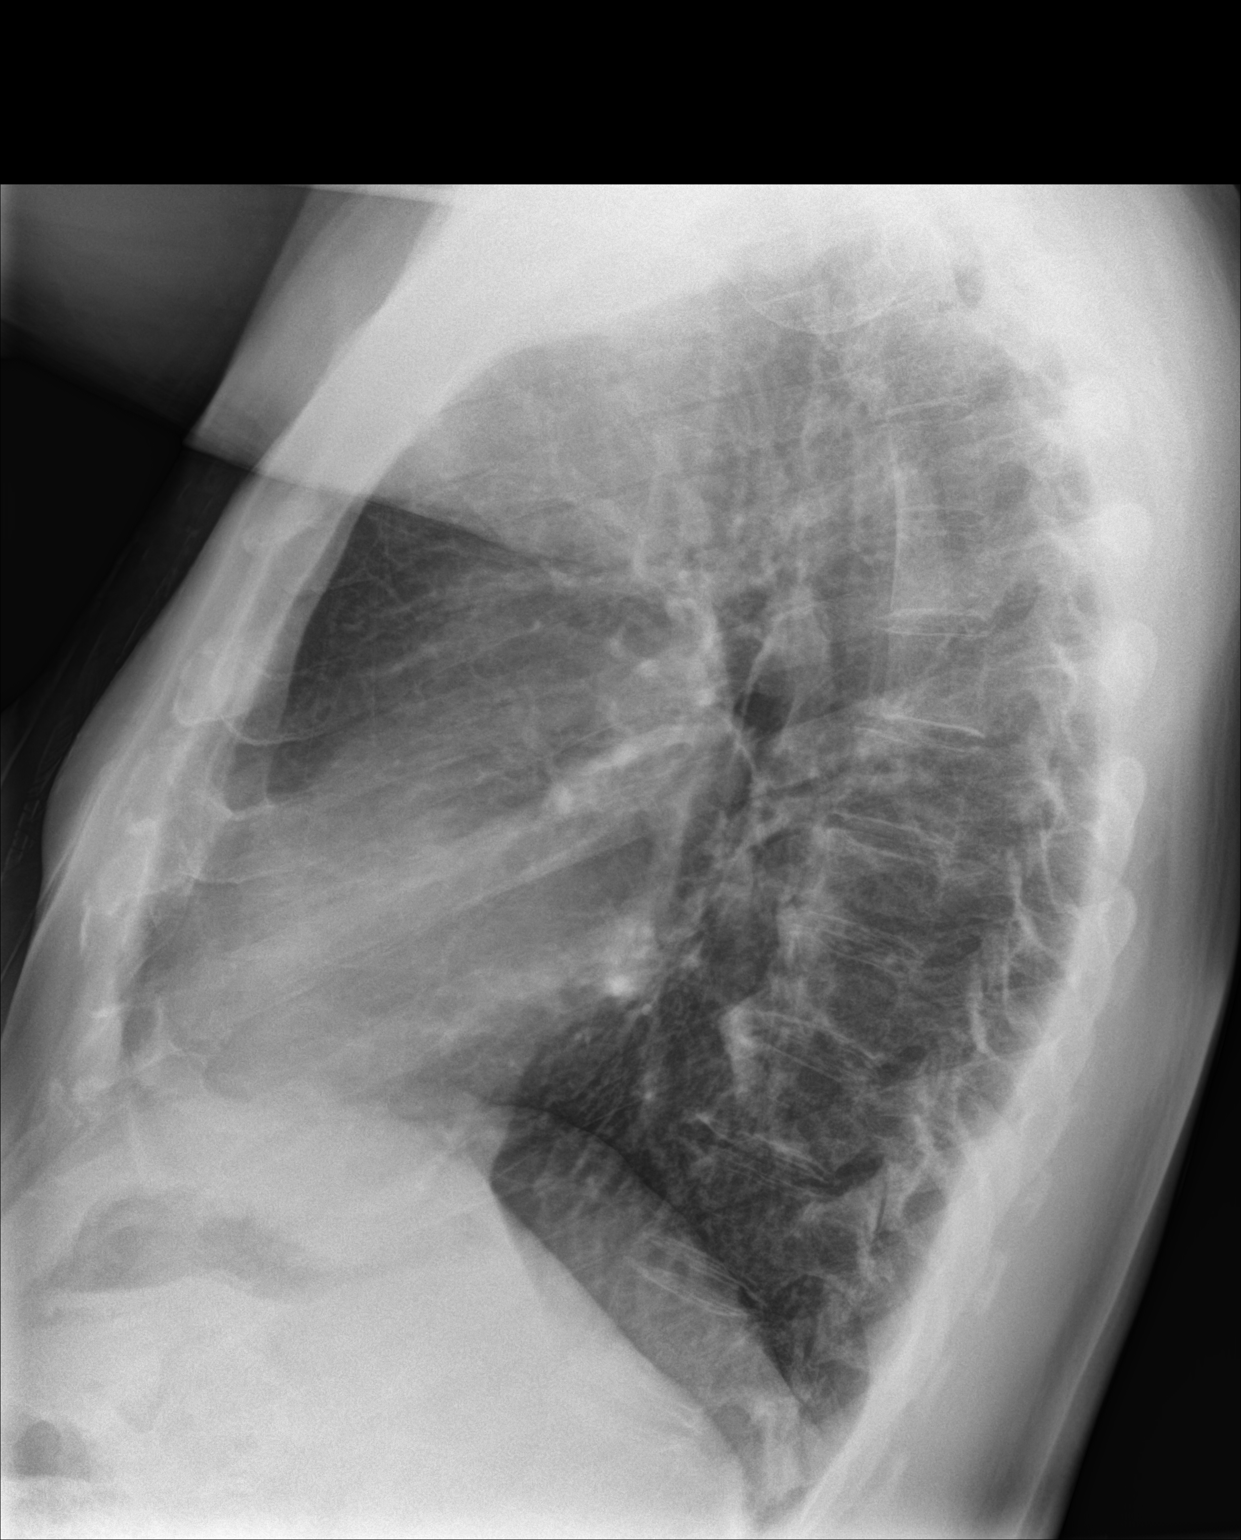

[3 of 3 positions shown; findings below may reference images not displayed]

FINDINGS: Stable cardiomediastinal silhouette with normal heart size. No
pneumothorax. No pleural effusion. No pulmonary edema. No acute
consolidative airspace disease. Chronic mild biapical
pleural-parenchymal scarring.
IMPRESSION: No active cardiopulmonary disease.

## 2023-03-18 ENCOUNTER — Telehealth: Payer: Self-pay

## 2023-03-18 NOTE — Progress Notes (Unsigned)
  Care Management   Outreach Note  03/18/2023 Name: Craig Arnold MRN: [id number] DOB: [birth year]  An unsuccessful telephone outreach was attempted today to contact the patient about Chronic Care Management needs.    Follow Up Plan:  A HIPAA compliant phone message was left for the patient providing contact information and requesting a return call.  The care management team will reach out to the patient again over the next 7 days.  If patient returns call to provider office, please advise to call [facility] * at [phone], [place], [place] [address] Direct Dial: [phone] [email or web address]

## 2023-03-19 ENCOUNTER — Other Ambulatory Visit: Payer: Self-pay

## 2023-03-19 DIAGNOSIS — Z122 Encounter for screening for malignant neoplasm of respiratory organs: Secondary | ICD-10-CM

## 2023-03-19 DIAGNOSIS — Z87891 Personal history of nicotine dependence: Secondary | ICD-10-CM

## 2023-03-23 ENCOUNTER — Encounter: Payer: Self-pay | Admitting: Adult Health

## 2023-03-23 ENCOUNTER — Ambulatory Visit: Payer: Medicare Other | Admitting: Adult Health

## 2023-03-23 ENCOUNTER — Other Ambulatory Visit: Payer: Self-pay | Admitting: Emergency Medicine

## 2023-03-23 VITALS — BP 150/60 | HR 90 | Temp 98.4°F | Ht 73.0 in | Wt 228.6 lb

## 2023-03-23 DIAGNOSIS — J479 Bronchiectasis, uncomplicated: Secondary | ICD-10-CM | POA: Diagnosis not present

## 2023-03-23 DIAGNOSIS — R531 Weakness: Secondary | ICD-10-CM

## 2023-03-23 DIAGNOSIS — J301 Allergic rhinitis due to pollen: Secondary | ICD-10-CM

## 2023-03-23 DIAGNOSIS — J449 Chronic obstructive pulmonary disease, unspecified: Secondary | ICD-10-CM | POA: Diagnosis not present

## 2023-03-23 DIAGNOSIS — R5383 Other fatigue: Secondary | ICD-10-CM | POA: Diagnosis not present

## 2023-03-23 LAB — CORTISOL: Cortisol, Plasma: 6.1 ug/dL

## 2023-03-23 MED ORDER — BUDESONIDE-FORMOTEROL FUMARATE 160-4.5 MCG/ACT IN AERO: INHALATION_SPRAY | RESPIRATORY_TRACT | 5 refills | Status: DC

## 2023-03-23 MED ORDER — PREDNISONE 5 MG PO TABS: 5.0000 mg | ORAL_TABLET | Freq: Every day | ORAL | 3 refills | Status: DC

## 2023-03-23 NOTE — Progress Notes (Signed)
@Patient  ID: Craig Arnold, male    DOB: 12-Apr-1956, 67 y.o.   MRN: [id number]  Chief Complaint  Patient presents with   Follow-up    Referring provider: Dettinger, Fransisca Kaufmann, MD  HPI: 67 year old male former smoker followed for COPD with asthma-on chronic steroids since 2010, bronchiectasis, MAI (completed antibiotic therapy March 2009 through October 2009),  Participates in the lung cancer CT screening program  TEST/EVENTS :  CT chest September 2011 COPD changes, areas of scarring in the right middle lobe and lingula CT chest March 17, 2023 showed moderate emphysema, biapical pleural-parenchymal scarring and no suspicious pulmonary nodules-lung RADS 1  03/23/2023 Follow-up COPD, asthma, bronchiectasis Patient returns for 2-week follow-up.  Patient was seen last visit with complaints of weakness fatigue low energy after weaning off of prednisone.  Patient has a history of COPD with asthma and bronchiectasis.  He has been on longstanding steroids since 2010.  Baseline dose at 10 mg.  Over the last few months he has been slowly weaning off of prednisone.  Patient says once he got down to the lowest doses of prednisone and also when he came off of prednisone he noticed a big change in his energy level.  2 weeks ago he was seen in the office.  And recommended to restart prednisone at 2 mg.  Also was restarted on Symbicort.  Patient says he has seen a slight improvement in his energy level but continues to feel weak with low energy.  Breathing is doing about the same.  Has minimum cough and congestion.  Patient says he has back on Symbicort twice daily.  However is worried about being able to afford it at his have been expensive in the past.  He does remain on Daliresp daily.  He does not have a flutter valve He denies any hemoptysis, chest pain, orthopnea, edema.  No fever.  No Known Allergies  Immunization History  Administered Date(s) Administered   Td 08/16/2007    Past Medical History:   Diagnosis Date   Allergic rhinitis    Asthma    Atypical mycobacterial infection    Bronchiectasis    COPD (chronic obstructive pulmonary disease)    Cough    Diabetes mellitus without complication    GERD (gastroesophageal reflux disease)    HTN (hypertension)    Pulmonary nodule     Tobacco History: Social History   Tobacco Use  Smoking Status Former   Packs/day: 1.00   Years: 40.00   Additional pack years: 0.00   Total pack years: 40.00   Types: Cigarettes   Quit date: 07/21/2008   Years since quitting: 14.6  Smokeless Tobacco Never   Counseling given: Not Answered   Outpatient Medications Prior to Visit  Medication Sig Dispense Refill   Accu-Chek Softclix Lancets lancets CHECK BLOOD SUGAR 3 TIMES DAILY Dx E11.69 300 each 3   albuterol (VENTOLIN HFA) 108 (90 Base) MCG/ACT inhaler INHALE 2 PUFFS BY MOUTH EVERY 4 HOURS AS NEEDED 6.7 each 2   Blood Glucose Monitoring Suppl (ACCU-CHEK GUIDE) w/Device KIT Check BS 3 times a day Dx E11.69 1 kit 0   fluticasone (FLONASE) 50 MCG/ACT nasal spray Place 1 spray into both nostrils 2 (two) times daily as needed for allergies or rhinitis. 16 g 6   glucose blood (ACCU-CHEK GUIDE) test strip Use as instructed 300 strip 3   Lancets Misc. (ACCU-CHEK SOFTCLIX LANCET DEV) KIT CHECK BLOOD SUGARS 3 TIMES A DAY Dx E11.69 1 kit 0   loratadine (CLARITIN) 10  MG tablet Take 10 mg by mouth daily.  [employer] brand     metFORMIN (GLUCOPHAGE) 500 MG tablet Take 1 tablet (500 mg total) by mouth 2 (two) times daily with a meal. 180 tablet 3   Multiple Vitamins-Minerals (MULTIVITAMIN ADULTS 50+ PO) Take by mouth.     olmesartan (BENICAR) 20 MG tablet Take 1 tablet (20 mg total) by mouth daily. 90 tablet 3   omeprazole (PRILOSEC) 20 MG capsule Take 1 capsule (20 mg total) by mouth daily. 90 capsule 3   pravastatin (PRAVACHOL) 40 MG tablet Take 1 tablet (40 mg total) by mouth daily. 90 tablet 3   roflumilast (DALIRESP) 500 MCG TABS tablet TAKE 1  TABLET BY MOUTH DAILY 90 tablet 3   sitaGLIPtin (JANUVIA) 100 MG tablet Take 1 tablet (100 mg total) by mouth daily. 90 tablet 3   predniSONE (DELTASONE) 1 MG tablet Take 2 tablets (2 mg total) by mouth daily with breakfast. 28 tablet 0   SYMBICORT 160-4.5 MCG/ACT inhaler INHALE 2 PUFFS INTO THE LUNGS TWICE A DAY 10.2 each 5   No facility-administered medications prior to visit.     Review of Systems:   Constitutional:   No  weight loss, night sweats,  Fevers, chills,  +fatigue, or  lassitude.  HEENT:   No headaches,  Difficulty swallowing,  Tooth/dental problems, or  Sore throat,                No sneezing, itching, ear ache, nasal congestion, post nasal drip,   CV:  No chest pain,  Orthopnea, PND, swelling in lower extremities, anasarca, dizziness, palpitations, syncope.   GI  No heartburn, indigestion, abdominal pain, nausea, vomiting, diarrhea, change in bowel habits, loss of appetite, bloody stools.   Resp:  No chest wall deformity  Skin: no rash or lesions.  GU: no dysuria, change in color of urine, no urgency or frequency.  No flank pain, no hematuria   MS:  No joint pain or swelling.  No decreased range of motion.  No back pain.    Physical Exam  BP (!) 150/60 (BP Location: Left Arm, Patient Position: Sitting, Cuff Size: Large)   Pulse 90   Temp 98.4 F (36.9 C) (Oral)   Ht 6\' 1"  (1.854 m)   Wt 228 lb 9.6 oz (103.7 kg)   SpO2 97%   BMI 30.16 kg/m   GEN: A/Ox3; pleasant , NAD, well nourished    HEENT:  [place]/AT,   NOSE-clear, THROAT-clear, no lesions, no postnasal drip or exudate noted.   NECK:  Supple w/ fair ROM; no JVD; normal carotid impulses w/o bruits; no thyromegaly or nodules palpated; no lymphadenopathy.    RESP  Clear  P & A; w/o, wheezes/ rales/ or rhonchi. no accessory muscle use, no dullness to percussion  CARD:  RRR, no m/r/g, no peripheral edema, pulses intact, no cyanosis or clubbing.  GI:   Soft & nt; nml bowel sounds; no organomegaly or  masses detected.   Musco: Warm bil, no deformities or joint swelling noted.   Neuro: alert, no focal deficits noted.    Skin: Warm, no lesions or rashes    Lab Results:    BNP No results found for: "BNP"    Imaging: CT CHEST LUNG CA SCREEN LOW DOSE W/O CM  Result Date: 03/19/2023 CLINICAL DATA:  67 year old male former smoker, quit 14 years ago, with 40 pack-year history of smoking, for initial lung cancer screening EXAM: CT CHEST WITHOUT CONTRAST LOW-DOSE FOR LUNG CANCER  SCREENING TECHNIQUE: Multidetector CT imaging of the chest was performed following the standard protocol without IV contrast. RADIATION DOSE REDUCTION: This exam was performed according to the departmental dose-optimization program which includes automated exposure control, adjustment of the mA and/or kV according to patient size and/or use of iterative reconstruction technique. COMPARISON:  CT chest dated 09/01/2010 FINDINGS: Cardiovascular: The heart is normal in size. No pericardial effusion. No evidence of thoracic aortic aneurysm. Mild atherosclerotic calcifications of the arch. Mild coronary atherosclerosis of the LAD. Mediastinum/Nodes: No suspicious mediastinal lymphadenopathy. Visualized thyroid is unremarkable. Lungs/Pleura: Moderate centrilobular and paraseptal emphysematous changes, upper lung predominant. Biapical pleural-parenchymal scarring. No focal consolidation. No suspicious pulmonary nodules. No pleural effusion or pneumothorax. Upper Abdomen: Visualized upper abdomen is grossly unremarkable. Musculoskeletal: Mild degenerative changes of the lower thoracic spine. IMPRESSION: Lung-RADS 1, negative. Continue annual screening with low-dose chest CT without contrast in 12 months. Aortic Atherosclerosis (ICD10-I70.0) and Emphysema (ICD10-J43.9). Electronically Signed   By: Julian Hy M.D.   On: 03/19/2023 02:06         No data to display          No results found for:  "NITRICOXIDE"      Assessment & Plan:   COPD (chronic obstructive pulmonary disease) ([place]) COPD with asthma-patient is on an aggressive regimen with worsening symptom burden weaning off of Symbicort and chronic steroids.  Will check PFTs on return.  Recent CT chest showed chronic changes without acute process.  No suspicious pulmonary nodules. For now continue on Symbicort.  If unable to afford would recommend changing to nebulized medications.  Patient is meeting with his pharmacy team at his primary care provider to see what would be the most affordable medication options for his formulary.  Plan  Patient Instructions  Continue on Symbicort 2 puffs Twice daily, rinse after use. (Call back if unable to afford, can look nebulizer alternatives)  Albuterol inhaler As needed   Increase Prednisone 5mg  daily.  Begin Flutter valve Twice daily   Continue on Daliresp daily.  Mucinex Twice daily  As needed  cough/congestion  Labs today  Follow up with Dr. Lamonte Sakai  6-8 weeks with PFT and As needed   Please contact office for sooner follow up if symptoms do not improve or worsen or seek emergency care      Bronchiectasis without complication History of bronchiectasis -recent CT chest shows stable scarring.  Add flutter valve to help with mucociliary clearance.  Continue on bronchodilators.  Allergic rhinitis due to pollen Continue on current maintenance regimen  Fatigue Patient with significant fatigue and low energy after weaning off of prednisone.  Concern for adrenal insufficiency.  Patient has been on steroids since 2010.  Definitely had a change in energy level since weaning off of prednisone.  Has had some improvement since starting back on low-dose prednisone.  Will go back up to 5 mg.  Check cortisol level today.  May be unable to totally come off of steroids  Plan  Patient Instructions  Continue on Symbicort 2 puffs Twice daily, rinse after use. (Call back if unable to afford, can  look nebulizer alternatives)  Albuterol inhaler As needed   Increase Prednisone 5mg  daily.  Begin Flutter valve Twice daily   Continue on Daliresp daily.  Mucinex Twice daily  As needed  cough/congestion  Labs today  Follow up with Dr. Lamonte Sakai  6-8 weeks with PFT and As needed   Please contact office for sooner follow up if symptoms do not improve or worsen  or seek emergency care        Rexene Edison, NP 03/23/2023

## 2023-03-23 NOTE — Progress Notes (Signed)
Patient seen in the office today and instructed on use of flutter valve.  Patient expressed understanding and demonstrated technique.  

## 2023-03-23 NOTE — Patient Instructions (Addendum)
Continue on Symbicort 2 puffs Twice daily, rinse after use. (Call back if unable to afford, can look nebulizer alternatives)  Albuterol inhaler As needed   Increase Prednisone 5mg  daily.  Begin Flutter valve Twice daily   Continue on Daliresp daily.  Mucinex Twice daily  As needed  cough/congestion  Labs today  Follow up with Dr. Lamonte Sakai  6-8 weeks with PFT and As needed   Please contact office for sooner follow up if symptoms do not improve or worsen or seek emergency care

## 2023-03-23 NOTE — Assessment & Plan Note (Signed)
Continue on current maintenance regimen 

## 2023-03-23 NOTE — Assessment & Plan Note (Signed)
Patient with significant fatigue and low energy after weaning off of prednisone.  Concern for adrenal insufficiency.  Patient has been on steroids since 2010.  Definitely had a change in energy level since weaning off of prednisone.  Has had some improvement since starting back on low-dose prednisone.  Will go back up to 5 mg.  Check cortisol level today.  May be unable to totally come off of steroids  Plan  Patient Instructions  Continue on Symbicort 2 puffs Twice daily, rinse after use. (Call back if unable to afford, can look nebulizer alternatives)  Albuterol inhaler As needed   Increase Prednisone 5mg  daily.  Begin Flutter valve Twice daily   Continue on Daliresp daily.  Mucinex Twice daily  As needed  cough/congestion  Labs today  Follow up with Dr. Lamonte Sakai  6-8 weeks with PFT and As needed   Please contact office for sooner follow up if symptoms do not improve or worsen or seek emergency care

## 2023-03-23 NOTE — Assessment & Plan Note (Signed)
COPD with asthma-patient is on an aggressive regimen with worsening symptom burden weaning off of Symbicort and chronic steroids.  Will check PFTs on return.  Recent CT chest showed chronic changes without acute process.  No suspicious pulmonary nodules. For now continue on Symbicort.  If unable to afford would recommend changing to nebulized medications.  Patient is meeting with his pharmacy team at his primary care provider to see what would be the most affordable medication options for his formulary.  Plan  Patient Instructions  Continue on Symbicort 2 puffs Twice daily, rinse after use. (Call back if unable to afford, can look nebulizer alternatives)  Albuterol inhaler As needed   Increase Prednisone 5mg  daily.  Begin Flutter valve Twice daily   Continue on Daliresp daily.  Mucinex Twice daily  As needed  cough/congestion  Labs today  Follow up with Dr. Lamonte Sakai  6-8 weeks with PFT and As needed   Please contact office for sooner follow up if symptoms do not improve or worsen or seek emergency care

## 2023-03-23 NOTE — Assessment & Plan Note (Signed)
History of bronchiectasis -recent CT chest shows stable scarring.  Add flutter valve to help with mucociliary clearance.  Continue on bronchodilators.

## 2023-03-24 ENCOUNTER — Telehealth: Payer: Self-pay | Admitting: Adult Health

## 2023-03-24 NOTE — Progress Notes (Signed)
ATC x1.  LVM to return call. 

## 2023-03-24 NOTE — Telephone Encounter (Signed)
Went over lab work with patient. He verbalized understanding. NFN

## 2023-03-30 NOTE — Progress Notes (Signed)
  Care Coordination  Note  03/30/2023 Name: Craig Arnold MRN: [id number] DOB: 14-Sep-1956  [name] is a 67 y.o. year old male who is a primary care patient of Dettinger, Elige Radon, MD. I reached out to [name] by phone today to offer care coordination services.      [name] was given information about Care Coordination services today including:  The Care Coordination services include support from the care team which includes your Nurse Coordinator, Clinical Social Worker, or Pharmacist.  The Care Coordination team is here to help remove barriers to the health concerns and goals most important to you. Care Coordination services are voluntary and the patient may decline or stop services at any time by request to their care team member.   Patient agreed to services and verbal consent obtained.   Follow up plan: Telephone appointment with care coordination team member scheduled for:04/20/2023  Penne Lash, RMA Care Guide [facility]  [place], [place] [address] Direct Dial: [phone] [email or web address]

## 2023-04-20 ENCOUNTER — Ambulatory Visit (INDEPENDENT_AMBULATORY_CARE_PROVIDER_SITE_OTHER): Payer: Medicare Other | Admitting: Pharmacist

## 2023-04-20 DIAGNOSIS — Z7984 Long term (current) use of oral hypoglycemic drugs: Secondary | ICD-10-CM

## 2023-04-20 DIAGNOSIS — J449 Chronic obstructive pulmonary disease, unspecified: Secondary | ICD-10-CM

## 2023-04-20 DIAGNOSIS — E1169 Type 2 diabetes mellitus with other specified complication: Secondary | ICD-10-CM

## 2023-04-20 NOTE — Progress Notes (Signed)
04/20/2023 Name: Craig Arnold MRN: [id number] DOB: 1956/05/12  Chief Complaint  Patient presents with   Medication Management    Januvia and Symbicort affordability     Craig Arnold is a 67 y.o. year old male who presented for a telephone visit.   They were referred to the pharmacist by their PCP for assistance in managing medication access.   Subjective:  Care Team: Primary Care Provider: Dettinger, Elige Radon, MD; Next Scheduled Visit: 05/28/2023 Pulmonologist: Levy Pupa, MD; Next Scheduled Visit: 05/06/2023  Medication Access/Adherence  Current Pharmacy:  [facility] [phone] - [place], [place] - [address] [place] [address] Phone: [phone] Fax: [phone]   Patient reports affordability concerns with their medications: Yes - Symbicort and Januvia not affordable once he hits the 'donut hole'; patient also endorses making more than income cut off for Medicare Extra Help/LIS Patient reports access/transportation concerns to their pharmacy: No  Patient reports adherence concerns with their medications:  No     Diabetes:  Current medications: Januvia 100 mg daily, metformin 500 mg BID  -Last A1c 6.7% in March 2024  COPD:  Current medications: Symbicort 160-4.5 2 puffs BID, roflumilast 500 mcg daily, prednisone 5 mg daily, albuterol PRN  Reports 0 exacerbations in the past year -Reports breathing has improved  CAT score: 16   Objective: Lab Results  Component Value Date   HGBA1C 6.7 (H) 02/24/2023    Lab Results  Component Value Date   CREATININE 0.86 11/23/2022   BUN 13 11/23/2022   NA 139 11/23/2022   K 4.1 11/23/2022   CL 103 11/23/2022   CO2 20 11/23/2022    Lab Results  Component Value Date   CHOL 156 11/23/2022   HDL 47 11/23/2022   LDLCALC 81 11/23/2022   LDLDIRECT 103.7 08/16/2007   TRIG 163 (H) 11/23/2022   CHOLHDL 3.3 11/23/2022    Medications Reviewed Today     Reviewed by Rushie Goltz, RPH  (Pharmacist) on 04/20/23 at 1111  Med List Status: <None>   Medication Order Taking? Sig Documenting Provider Last Dose Status Informant  Accu-Chek Softclix Lancets lancets [id number]  CHECK BLOOD SUGAR 3 TIMES DAILY Dx E11.69 Dettinger, Elige Radon, MD  Active   albuterol (VENTOLIN HFA) 108 (90 Base) MCG/ACT inhaler [id number] Yes INHALE 2 PUFFS BY MOUTH EVERY 4 HOURS AS NEEDED Leslye Peer, MD Taking Active   Blood Glucose Monitoring Suppl (ACCU-CHEK GUIDE) w/Device KIT [id number]  Check BS 3 times a day Dx E11.69 Dettinger, Elige Radon, MD  Active   budesonide-formoterol [facility]) 160-4.5 MCG/ACT inhaler [id number] Yes INHALE 2 PUFFS INTO THE LUNGS TWICE A DAY Parrett, Tammy S, NP Taking Active   fluticasone (FLONASE) 50 MCG/ACT nasal spray [id number]  Place 1 spray into both nostrils 2 (two) times daily as needed for allergies or rhinitis. Dettinger, Elige Radon, MD  Active   glucose blood (ACCU-CHEK GUIDE) test strip [id number]  Use as instructed Dettinger, Elige Radon, MD  Active   Lancets Misc. (ACCU-CHEK SOFTCLIX LANCET DEV) KIT [id number]  CHECK BLOOD SUGARS 3 TIMES A DAY Dx E11.69 Dettinger, Elige Radon, MD  Active   loratadine (CLARITIN) 10 MG tablet [id number]  Take 10 mg by mouth daily.  [employer] brand [provider]  Active   metFORMIN (GLUCOPHAGE) 500 MG tablet [id number] Yes Take 1 tablet (500 mg total) by mouth 2 (two) times daily with a meal. Dettinger, Elige Radon, MD Taking Active   Multiple Vitamins-Minerals (MULTIVITAMIN ADULTS 50+ PO) [id number]  Take by mouth. [provider]  Active   olmesartan (BENICAR) 20 MG tablet [id number]  Take 1 tablet (20 mg total) by mouth daily. Dettinger, Elige Radon, MD  Active   omeprazole (PRILOSEC) 20 MG capsule [id number]  Take 1 capsule (20 mg total) by mouth daily. Dettinger, Elige Radon, MD  Active   pravastatin (PRAVACHOL) 40 MG tablet [id number]  Take 1 tablet (40 mg total) by mouth daily. Dettinger, Elige Radon, MD  Active   predniSONE (DELTASONE)  5 MG tablet [id number] Yes Take 1 tablet (5 mg total) by mouth daily with breakfast. Parrett, Virgel Bouquet, NP Taking Active   roflumilast (DALIRESP) 500 MCG TABS tablet [id number] Yes TAKE 1 TABLET BY MOUTH DAILY Byrum, Les Pou, MD Taking Active   sitaGLIPtin (JANUVIA) 100 MG tablet [id number] Yes Take 1 tablet (100 mg total) by mouth daily. Dettinger, Elige Radon, MD Taking Active               Assessment/Plan:   Diabetes: - Currently controlled per A1c. Patient reports Al[place] is affordable for now, until he reaches the 'donut hole'. Encouraged patient to reach out later in the year if he has difficulty affording medications. He does not qualify for Medicare Extra Help/LIS due to income.  - Reviewed long term cardiovascular and renal outcomes of uncontrolled blood sugar - Reviewed goal A1c, goal fasting, and goal 2 hour post prandial glucose   COPD: - Currently controlled per patient report.  - Reviewed appropriate inhaler technique. - Patient may qualify for Breztri PAP. Consider switching Symbicort to [place] at follow up. - Encouraged patient to reach out later in the year if he has difficulty affording medications. He does not qualify for Medicare Extra Help/LIS due to income.   Follow Up Plan:  Pharmacist: 2 week f/up PCP: 05/28/2023  Valeda Malm, Pharm.D. PGY-2 Ambulatory Care Pharmacy Resident  Kieth Brightly, PharmD, BCACP Clinical Pharmacist, [facility]

## 2023-05-05 ENCOUNTER — Encounter: Payer: Self-pay | Admitting: Pharmacist

## 2023-05-06 ENCOUNTER — Encounter (HOSPITAL_BASED_OUTPATIENT_CLINIC_OR_DEPARTMENT_OTHER): Payer: Medicare Other

## 2023-05-06 ENCOUNTER — Ambulatory Visit: Payer: Medicare Other | Admitting: Emergency Medicine

## 2023-05-28 ENCOUNTER — Encounter: Payer: Self-pay | Admitting: Family Medicine

## 2023-05-28 ENCOUNTER — Ambulatory Visit (INDEPENDENT_AMBULATORY_CARE_PROVIDER_SITE_OTHER): Payer: Medicare Other | Admitting: Family Medicine

## 2023-05-28 VITALS — BP 128/72 | HR 92 | Ht 73.0 in | Wt 233.0 lb

## 2023-05-28 DIAGNOSIS — E1169 Type 2 diabetes mellitus with other specified complication: Secondary | ICD-10-CM | POA: Diagnosis not present

## 2023-05-28 DIAGNOSIS — I152 Hypertension secondary to endocrine disorders: Secondary | ICD-10-CM | POA: Diagnosis not present

## 2023-05-28 DIAGNOSIS — J449 Chronic obstructive pulmonary disease, unspecified: Secondary | ICD-10-CM | POA: Diagnosis not present

## 2023-05-28 DIAGNOSIS — E1159 Type 2 diabetes mellitus with other circulatory complications: Secondary | ICD-10-CM | POA: Diagnosis not present

## 2023-05-28 DIAGNOSIS — M25512 Pain in left shoulder: Secondary | ICD-10-CM

## 2023-05-28 DIAGNOSIS — K219 Gastro-esophageal reflux disease without esophagitis: Secondary | ICD-10-CM | POA: Diagnosis not present

## 2023-05-28 DIAGNOSIS — Z7984 Long term (current) use of oral hypoglycemic drugs: Secondary | ICD-10-CM | POA: Diagnosis not present

## 2023-05-28 LAB — BAYER DCA HB A1C WAIVED: HB A1C (BAYER DCA - WAIVED): 7.7 % — ABNORMAL HIGH (ref 4.8–5.6)

## 2023-05-28 NOTE — Progress Notes (Signed)
BP 128/72   Pulse 92   Ht 6\' 1"  (1.854 m)   Wt 233 lb (105.7 kg)   SpO2 97%   BMI 30.74 kg/m    Subjective:   Patient ID: Craig Arnold, male    DOB: December 22, 1955, 67 y.o.   MRN: [id number]  HPI: Craig Arnold is a 67 y.o. male presenting on 05/28/2023 for Medical Management of Chronic Issues, Diabetes, and Shoulder Pain (left)   HPI Type 2 diabetes mellitus Patient comes in today for recheck of his diabetes. Patient has been currently taking metformin and Januvia, he does admit that he has been having a lot of chips and potatoes recently and his blood sugar was 170s this morning. Patient is currently on an ACE inhibitor/ARB. Patient has not seen an ophthalmologist this year. Patient denies any new issues with their feet. The symptom started onset as an adult hypertension and COPD and GERD ARE RELATED TO DM   COPD Patient is coming in for COPD recheck today.  He is currently on albuterol and Symbicort and Daliresp and prednisone, sees pulmonology.  He has a mild chronic cough but denies any major coughing spells or wheezing spells.  He has 2 nighttime symptoms per week and 2 daytime symptoms per week currently.   Hypertension Patient is currently on olmesartan, and their blood pressure today is 128/72. Patient denies any lightheadedness or dizziness. Patient denies headaches, blurred vision, chest pains, shortness of breath, or weakness. Denies any side effects from medication and is content with current medication.   GERD Patient is currently on omeprazole.  She denies any major symptoms or abdominal pain or belching or burping. She denies any blood in her stool or lightheadedness or dizziness.   He says his biggest complaint today is that his shoulder has been bothering him on the left shoulder, it bothers him anteriorly in his pectoralis muscle and anterior shoulder and down the lateral aspect.  He says it hurts more with reaching behind himself and overhead.  This is been bothering him  for the past couple months and he has tried ibuprofen and Tylenol may help minimally.  He says the pain is not severe, more mild to moderate but is just persisting.  Relevant past medical, surgical, family and social history reviewed and updated as indicated. Interim medical history since our last visit reviewed. Allergies and medications reviewed and updated.  Review of Systems  Constitutional:  Negative for chills and fever.  Eyes:  Negative for visual disturbance.  Respiratory:  Negative for shortness of breath and wheezing.   Cardiovascular:  Negative for chest pain and leg swelling.  Musculoskeletal:  Positive for arthralgias. Negative for back pain and gait problem.  Skin:  Negative for rash.  Neurological:  Negative for dizziness, weakness and numbness.  All other systems reviewed and are negative.   Per HPI unless specifically indicated above   Allergies as of 05/28/2023   No Known Allergies      Medication List        Accurate as of May 28, 2023 11:13 AM. If you have any questions, ask your nurse or doctor.          Accu-Chek Guide test strip Generic drug: glucose blood Use as instructed   Accu-Chek Guide w/Device Kit Check BS 3 times a day Dx E11.69   Accu-Chek Softclix Lancet Dev Kit CHECK BLOOD SUGARS 3 TIMES A DAY Dx E11.69   Accu-Chek Softclix Lancets lancets CHECK BLOOD SUGAR 3 TIMES DAILY Dx E11.69  albuterol 108 (90 Base) MCG/ACT inhaler Commonly known as: VENTOLIN HFA INHALE 2 PUFFS BY MOUTH EVERY 4 HOURS AS NEEDED   budesonide-formoterol 160-4.5 MCG/ACT inhaler Commonly known as: Symbicort INHALE 2 PUFFS INTO THE LUNGS TWICE A DAY   fluticasone 50 MCG/ACT nasal spray Commonly known as: FLONASE Place 1 spray into both nostrils 2 (two) times daily as needed for allergies or rhinitis.   loratadine 10 MG tablet Commonly known as: CLARITIN Take 10 mg by mouth daily.  [employer] brand   metFORMIN 500 MG tablet Commonly known as:  GLUCOPHAGE Take 1 tablet (500 mg total) by mouth 2 (two) times daily with a meal.   MULTIVITAMIN ADULTS 50+ PO Take by mouth.   olmesartan 20 MG tablet Commonly known as: BENICAR Take 1 tablet (20 mg total) by mouth daily.   omeprazole 20 MG capsule Commonly known as: PRILOSEC Take 1 capsule (20 mg total) by mouth daily.   pravastatin 40 MG tablet Commonly known as: PRAVACHOL Take 1 tablet (40 mg total) by mouth daily.   predniSONE 5 MG tablet Commonly known as: DELTASONE Take 1 tablet (5 mg total) by mouth daily with breakfast.   roflumilast 500 MCG Tabs tablet Commonly known as: DALIRESP TAKE 1 TABLET BY MOUTH DAILY   sitaGLIPtin 100 MG tablet Commonly known as: Januvia Take 1 tablet (100 mg total) by mouth daily.         Objective:   BP 128/72   Pulse 92   Ht 6\' 1"  (1.854 m)   Wt 233 lb (105.7 kg)   SpO2 97%   BMI 30.74 kg/m   Wt Readings from Last 3 Encounters:  05/28/23 233 lb (105.7 kg)  03/23/23 228 lb 9.6 oz (103.7 kg)  03/09/23 227 lb 12.8 oz (103.3 kg)    Physical Exam Vitals and nursing note reviewed.  Constitutional:      General: He is not in acute distress.    Appearance: He is well-developed. He is not diaphoretic.  Eyes:     General: No scleral icterus.    Conjunctiva/sclera: Conjunctivae normal.  Neck:     Thyroid: No thyromegaly.  Cardiovascular:     Rate and Rhythm: Normal rate and regular rhythm.     Heart sounds: Normal heart sounds. No murmur heard. Pulmonary:     Effort: Pulmonary effort is normal. No respiratory distress.     Breath sounds: Normal breath sounds. No wheezing.  Musculoskeletal:        General: Normal range of motion.     Left shoulder: Tenderness (Pain with range of motion especially reaching behind his back and over the head) present. No bony tenderness or crepitus. Normal range of motion. Normal strength.     Cervical back: Neck supple.  Lymphadenopathy:     Cervical: No cervical adenopathy.  Skin:     General: Skin is warm and dry.     Findings: No rash.  Neurological:     Mental Status: He is alert and oriented to person, place, and time.     Coordination: Coordination normal.  Psychiatric:        Behavior: Behavior normal.       Assessment & Plan:   Problem List Items Addressed This Visit       Cardiovascular and Mediastinum   Hypertension associated with diabetes (HCC)     Respiratory   COPD (chronic obstructive pulmonary disease) (HCC)     Digestive   GERD     Endocrine   Type 2  diabetes mellitus with other specified complication (HCC) - Primary   Relevant Orders   CBC with Differential/Platelet   CMP14+EGFR   Lipid panel   Bayer DCA Hb A1c Waived   Other Visit Diagnoses     Acute pain of left shoulder       Relevant Orders   Ambulatory referral to Physical Therapy       Blood pressure looks good.  Will place referral to physical therapy for his shoulder.  A1c is 7.7, up slightly, he is going to try with diet first if he can get down Then we will have to add something for medicine. Follow up plan: Return in about 3 months (around 08/28/2023), or if symptoms worsen or fail to improve, for Diabetes and hypertension and COPD.  Counseling provided for all of the vaccine components Orders Placed This Encounter  Procedures   CBC with Differential/Platelet   CMP14+EGFR   Lipid panel   Bayer DCA Hb A1c Waived   Ambulatory referral to Physical Therapy    Arville Care, MD [place] [facility] 05/28/2023, 11:13 AM

## 2023-05-29 LAB — LIPID PANEL
Chol/HDL Ratio: 3.3 ratio (ref 0.0–5.0)
Cholesterol, Total: 182 mg/dL (ref 100–199)
HDL: 55 mg/dL (ref >39)
LDL Chol Calc (NIH): 83 mg/dL (ref 0–99)
Triglycerides: 269 mg/dL — ABNORMAL HIGH (ref 0–149)
VLDL Cholesterol Cal: 44 mg/dL — ABNORMAL HIGH (ref 5–40)

## 2023-05-29 LAB — CMP14+EGFR
ALT: 21 IU/L (ref 0–44)
AST: 19 IU/L (ref 0–40)
Albumin/Globulin Ratio: 1.6 (ref 1.2–2.2)
Albumin: 4.3 g/dL (ref 3.9–4.9)
Alkaline Phosphatase: 94 IU/L (ref 44–121)
BUN/Creatinine Ratio: 15 (ref 10–24)
BUN: 15 mg/dL (ref 8–27)
Bilirubin Total: 0.4 mg/dL (ref 0.0–1.2)
CO2: 23 mmol/L (ref 20–29)
Calcium: 9.2 mg/dL (ref 8.6–10.2)
Chloride: 103 mmol/L (ref 96–106)
Creatinine, Ser: 1.03 mg/dL (ref 0.76–1.27)
Globulin, Total: 2.7 g/dL (ref 1.5–4.5)
Glucose: 188 mg/dL — ABNORMAL HIGH (ref 70–99)
Potassium: 4.3 mmol/L (ref 3.5–5.2)
Sodium: 140 mmol/L (ref 134–144)
Total Protein: 7 g/dL (ref 6.0–8.5)
eGFR: 80 mL/min/{1.73_m2} (ref >59)

## 2023-05-29 LAB — CBC WITH DIFFERENTIAL/PLATELET
Basophils Absolute: 0 10*3/uL (ref 0.0–0.2)
Basos: 1 %
EOS (ABSOLUTE): 0.5 10*3/uL — ABNORMAL HIGH (ref 0.0–0.4)
Eos: 7 %
Hematocrit: 39.7 % (ref 37.5–51.0)
Hemoglobin: 13.1 g/dL (ref 13.0–17.7)
Immature Grans (Abs): 0 10*3/uL (ref 0.0–0.1)
Immature Granulocytes: 0 %
Lymphocytes Absolute: 1.8 10*3/uL (ref 0.7–3.1)
Lymphs: 23 %
MCH: 28.4 pg (ref 26.6–33.0)
MCHC: 33 g/dL (ref 31.5–35.7)
MCV: 86 fL (ref 79–97)
Monocytes Absolute: 1.2 10*3/uL — ABNORMAL HIGH (ref 0.1–0.9)
Monocytes: 16 %
Neutrophils Absolute: 4.1 10*3/uL (ref 1.4–7.0)
Neutrophils: 53 %
Platelets: 196 10*3/uL (ref 150–450)
RBC: 4.62 x10E6/uL (ref 4.14–5.80)
RDW: 14.5 % (ref 11.6–15.4)
WBC: 7.7 10*3/uL (ref 3.4–10.8)

## 2023-06-03 ENCOUNTER — Encounter: Payer: Self-pay | Admitting: Emergency Medicine

## 2023-06-03 ENCOUNTER — Ambulatory Visit: Payer: Medicare Other | Admitting: Emergency Medicine

## 2023-06-03 VITALS — BP 140/76 | HR 91 | Temp 98.1°F | Ht 73.0 in | Wt 231.6 lb

## 2023-06-03 DIAGNOSIS — R918 Other nonspecific abnormal finding of lung field: Secondary | ICD-10-CM

## 2023-06-03 DIAGNOSIS — J449 Chronic obstructive pulmonary disease, unspecified: Secondary | ICD-10-CM

## 2023-06-03 MED ORDER — PREDNISONE 1 MG PO TABS: 3.0000 mg | ORAL_TABLET | Freq: Every day | ORAL | 0 refills | Status: DC

## 2023-06-03 NOTE — Assessment & Plan Note (Signed)
We reviewed your lung cancer screening CT chest from March.  You need a repeat lung cancer screening CT in March 2025.

## 2023-06-03 NOTE — Patient Instructions (Signed)
Please continue your Symbicort 2 puffs twice a day.  Rinse and gargle after using. Keep albuterol available to use 2 puffs when needed for shortness of breath, chest tightness, wheezing. Stay on your Daliresp as you have been taking it We will decrease your prednisone to 3 mg daily.  If you develop recurrent symptoms of weakness, sleepiness, decreased energy then please go back up to 5 mg daily. We reviewed your lung cancer screening CT chest from March.  You need a repeat lung cancer screening CT in March 2025. Follow with [name] in 6 months or sooner if you have any problems

## 2023-06-03 NOTE — Assessment & Plan Note (Signed)
Please continue your Symbicort 2 puffs twice a day.  Rinse and gargle after using. Keep albuterol available to use 2 puffs when needed for shortness of breath, chest tightness, wheezing. Stay on your Daliresp as you have been taking it We will decrease your prednisone to 3 mg daily.  If you develop recurrent symptoms of weakness, sleepiness, decreased energy then please go back up to 5 mg daily. Follow with [name] in 6 months or sooner if you have any problems

## 2023-06-03 NOTE — Progress Notes (Signed)
HPI:  ROV 12/04/22 --Craig Arnold follows up today for his history of COPD with asthmatic features, bronchiectasis with remote Mycobacterium avium, chronic cough and upper airway irritation syndrome, chronic rhinitis.  He has been on daily prednisone.  At his last visit we tried going from 10 mg daily to 5 mg daily.  He previously was on Symbicort as his maintenance but was able to stop it and did not seem to miss it.  He remains on Daliresp, omeprazole.  He reports today that he feels the same, no worse on lower dose pred. Minimal albuterol use, sometimes at night for wheeze.   He quit smoking 14 yrs ago, is interested in a screening Ct chest.   ROV 03/09/23 --Craig Arnold with history of COPD and asthmatic features, bronchiectasis with remote Mycobacterium avium, chronic cough, chronic rhinitis.  He was able to come off of his Symbicort (stopped it really because of cost and poor insurance coverage). He still has some Symbicort and uses it intermittently.  He has been on maintenance prednisone and we started a slow taper in December.  He was able to wean to off. He has had decreased energy, wants to sleep all the time. He did have a URI recently.  We decided to refer him back to the lung cancer screening program at our last visit, next CT scan is still pending.  He stopped smoking 14 years ago.  ROV 06/03/2023 --follow-up visit was a 67-year-old man with COPD/asthma, bronchiectasis, remote Mycobacterium avium, chronic cough, chronic rhinitis.  He has been on maintenance prednisone for years and we were able to wean to off.  Unfortunately he developed symptoms consistent with possible adrenal insufficiency especially low energy level.  I restarted his prednisone at 2 mg daily, since increased to 5 mg.  He is on Daliresp.  Not currently on any inhaled medication, back on reliable Symbicort. Occasional  albuterol - depends on level of exertion, 0-2x a day.   LDCT chest 03/17/23 reviewed by me, lung  RADS 1, emphysema with no nodules.     EXAM:  Vitals:   06/03/23 1111  BP: (!) 140/76  Pulse: 91  Temp: 98.1 F (36.7 C)  TempSrc: Oral  SpO2: 98%  Weight: 231 lb 9.6 oz (105.1 kg)  Height: 6\' 1"  (1.854 m)    Gen: Craig, obese, in no distress,  normal affect, stutters  ENT: No lesions,  mouth clear,  oropharynx clear, no postnasal drip  Neck: No JVD, no Stridor  Lungs: Mostly clear.  No wheezing.  Soft end-exp wheeze  Cardiovascular: RRR, heart sounds normal, no murmur or gallops, no peripheral edema  Musculoskeletal: No deformities, no cyanosis or clubbing  Neuro: alert, non focal  Skin: Warm, no lesions or rashes   COPD (chronic obstructive pulmonary disease) (HCC) Please continue your Symbicort 2 puffs twice a day.  Rinse and gargle after using. Keep albuterol available to use 2 puffs when needed for shortness of breath, chest tightness, wheezing. Stay on your Daliresp as you have been taking it We will decrease your prednisone to 3 mg daily.  If you develop recurrent symptoms of weakness, sleepiness, decreased energy then please go back up to 5 mg daily. Follow with [name] in 6 months or sooner if you have any problems  Pulmonary nodules We reviewed your lung cancer screening CT chest from March.  You need a repeat lung cancer screening CT in March 2025.     Levy Pupa, MD, PhD 06/03/2023, 11:46 AM [facility] [phone] or if no answer [phone]

## 2023-06-12 ENCOUNTER — Other Ambulatory Visit: Payer: Self-pay | Admitting: Emergency Medicine

## 2023-06-16 MED ORDER — PREDNISONE 1 MG PO TABS: 3.0000 mg | ORAL_TABLET | Freq: Every day | ORAL | 2 refills | Status: DC

## 2023-06-22 ENCOUNTER — Telehealth: Payer: Self-pay | Admitting: Family Medicine

## 2023-06-22 NOTE — Telephone Encounter (Signed)
Please let patient know that I have left patient assistance paperwork up front for him to fill out We do not have samples of Januvia and this process can take up to 2 months to get.  Return paperwork to me ASAP

## 2023-06-22 NOTE — Telephone Encounter (Signed)
Left message informing patient and to call back with concerns.

## 2023-06-29 ENCOUNTER — Telehealth: Payer: Self-pay | Admitting: Emergency Medicine

## 2023-06-29 ENCOUNTER — Other Ambulatory Visit: Payer: Self-pay | Admitting: Emergency Medicine

## 2023-06-29 NOTE — Telephone Encounter (Signed)
Pt called in bc he needs a refill for Prednisone   Pharmacy: [facility] in [place], [place]

## 2023-06-30 NOTE — Telephone Encounter (Signed)
Called pt, pt looks to still have 2 refills left on 1 mg prednisone. Also called pharmacy to confirm.

## 2023-07-08 ENCOUNTER — Telehealth: Payer: Self-pay

## 2023-07-08 NOTE — Telephone Encounter (Signed)
Patient walked into the office today - he was bee stung mult times this morning. No history of allergy to bees - denies any trouble breathing or throat swelling. Patient has taken benadryl - advised patient to continue benadryl (be aware that it may may you drowsy), ice packs, topical itch creams, OTC pain relief if needed. Advised patient if symptoms worsen to contact the office and to seek emergency care for any throat swelling or trouble breathing.

## 2023-08-01 ENCOUNTER — Other Ambulatory Visit: Payer: Self-pay | Admitting: Family Medicine

## 2023-08-01 DIAGNOSIS — K219 Gastro-esophageal reflux disease without esophagitis: Secondary | ICD-10-CM

## 2023-08-01 DIAGNOSIS — E1159 Type 2 diabetes mellitus with other circulatory complications: Secondary | ICD-10-CM

## 2023-08-01 DIAGNOSIS — E1169 Type 2 diabetes mellitus with other specified complication: Secondary | ICD-10-CM

## 2023-09-03 ENCOUNTER — Other Ambulatory Visit: Payer: Self-pay | Admitting: Family Medicine

## 2023-09-03 ENCOUNTER — Encounter: Payer: Self-pay | Admitting: Family Medicine

## 2023-09-03 ENCOUNTER — Ambulatory Visit (INDEPENDENT_AMBULATORY_CARE_PROVIDER_SITE_OTHER): Payer: Medicare Other | Admitting: Family Medicine

## 2023-09-03 VITALS — BP 140/75 | HR 105 | Ht 73.0 in | Wt 233.0 lb

## 2023-09-03 DIAGNOSIS — E1169 Type 2 diabetes mellitus with other specified complication: Secondary | ICD-10-CM | POA: Diagnosis not present

## 2023-09-03 DIAGNOSIS — E1159 Type 2 diabetes mellitus with other circulatory complications: Secondary | ICD-10-CM | POA: Diagnosis not present

## 2023-09-03 DIAGNOSIS — I152 Hypertension secondary to endocrine disorders: Secondary | ICD-10-CM | POA: Diagnosis not present

## 2023-09-03 DIAGNOSIS — Z7984 Long term (current) use of oral hypoglycemic drugs: Secondary | ICD-10-CM

## 2023-09-03 DIAGNOSIS — E119 Type 2 diabetes mellitus without complications: Secondary | ICD-10-CM

## 2023-09-03 DIAGNOSIS — K219 Gastro-esophageal reflux disease without esophagitis: Secondary | ICD-10-CM | POA: Diagnosis not present

## 2023-09-03 LAB — BAYER DCA HB A1C WAIVED: HB A1C (BAYER DCA - WAIVED): 7.4 % — ABNORMAL HIGH (ref 4.8–5.6)

## 2023-09-03 MED ORDER — PRAVASTATIN SODIUM 40 MG PO TABS: 40.0000 mg | ORAL_TABLET | Freq: Every day | ORAL | 3 refills | Status: DC

## 2023-09-03 MED ORDER — OMEPRAZOLE 20 MG PO CPDR: 20.0000 mg | DELAYED_RELEASE_CAPSULE | Freq: Every day | ORAL | 3 refills | Status: DC

## 2023-09-03 MED ORDER — SITAGLIPTIN 100 MG PO TABS: 100.0000 mg | ORAL_TABLET | Freq: Every day | ORAL | 3 refills | Status: DC

## 2023-09-03 NOTE — Telephone Encounter (Signed)
I sent this 1 because it shows up like a lower cash price using goodrx.com, if they have this pill at the pharmacy that I would like him to check to see if it is cheaper using [email or web address].

## 2023-09-03 NOTE — Progress Notes (Signed)
BP (!) 140/75   Pulse (!) 105   Ht 6\' 1"  (1.854 m)   Wt 233 lb (105.7 kg)   SpO2 99%   BMI 30.74 kg/m    Subjective:   Patient ID: Craig Arnold, male    DOB: 09/18/1956, 67 y.o.   MRN: [id number]  HPI: Breandan Bamonte is a 67 y.o. male presenting on 09/03/2023 for Medical Management of Chronic Issues and Diabetes   HPI Type 2 diabetes mellitus Patient comes in today for recheck of his diabetes. Patient has been currently taking metformin, has been out of his Januvia because of cost. Patient is currently on an ACE inhibitor/ARB. Patient has not seen an ophthalmologist this year. Patient denies any new issues with their feet. The symptom started onset as an adult hypertension ARE RELATED TO DM   Hypertension Patient is currently on olmesartan, and their blood pressure today is 140/75. Patient denies any lightheadedness or dizziness. Patient denies headaches, blurred vision, chest pains, shortness of breath, or weakness. Denies any side effects from medication and is content with current medication.   GERD Patient is currently on omeprazole.  She denies any major symptoms or abdominal pain or belching or burping. She denies any blood in her stool or lightheadedness or dizziness.   Relevant past medical, surgical, family and social history reviewed and updated as indicated. Interim medical history since our last visit reviewed. Allergies and medications reviewed and updated.  Review of Systems  Constitutional:  Negative for chills and fever.  Eyes:  Negative for visual disturbance.  Respiratory:  Negative for shortness of breath and wheezing.   Cardiovascular:  Negative for chest pain and leg swelling.  Musculoskeletal:  Negative for back pain and gait problem.  Skin:  Negative for rash.  All other systems reviewed and are negative.   Per HPI unless specifically indicated above   Allergies as of 09/03/2023   No Known Allergies      Medication List        Accurate as of  September 03, 2023  9:56 AM. If you have any questions, ask your nurse or doctor.          STOP taking these medications    sitaGLIPtin 100 MG tablet Commonly known as: Januvia Stopped by: Elige Radon Taytem Ghattas       TAKE these medications    Accu-Chek Guide test strip Generic drug: glucose blood Use as instructed   Accu-Chek Guide w/Device Kit Check BS 3 times a day Dx E11.69   Accu-Chek Softclix Lancet Dev Kit CHECK BLOOD SUGARS 3 TIMES A DAY Dx E11.69   Accu-Chek Softclix Lancets lancets CHECK BLOOD SUGAR 3 TIMES DAILY Dx E11.69   albuterol 108 (90 Base) MCG/ACT inhaler Commonly known as: VENTOLIN HFA INHALE 2 PUFFS INTO THE LUNGS EVERY 4 HOURS AS NEEDED   budesonide-formoterol 160-4.5 MCG/ACT inhaler Commonly known as: Symbicort INHALE 2 PUFFS INTO THE LUNGS TWICE A DAY   fluticasone 50 MCG/ACT nasal spray Commonly known as: FLONASE Place 1 spray into both nostrils 2 (two) times daily as needed for allergies or rhinitis.   loratadine 10 MG tablet Commonly known as: CLARITIN Take 10 mg by mouth daily.  [employer] brand   metFORMIN 500 MG tablet Commonly known as: GLUCOPHAGE Take 1 tablet (500 mg total) by mouth 2 (two) times daily with a meal.   MULTIVITAMIN ADULTS 50+ PO Take by mouth.   olmesartan 20 MG tablet Commonly known as: BENICAR Take 1 tablet (20 mg total) by  mouth daily.   omeprazole 20 MG capsule Commonly known as: PRILOSEC Take 1 capsule (20 mg total) by mouth daily. What changed: how much to take Changed by: Elige Radon Fawaz Borquez   pravastatin 40 MG tablet Commonly known as: PRAVACHOL Take 1 tablet (40 mg total) by mouth daily.   predniSONE 5 MG tablet Commonly known as: DELTASONE Take 1 tablet (5 mg total) by mouth daily with breakfast.   predniSONE 1 MG tablet Commonly known as: DELTASONE Take 3 tablets (3 mg total) by mouth daily with breakfast.   roflumilast 500 MCG Tabs tablet Commonly known as: DALIRESP TAKE 1 TABLET BY  MOUTH DAILY         Objective:   BP (!) 140/75   Pulse (!) 105   Ht 6\' 1"  (1.854 m)   Wt 233 lb (105.7 kg)   SpO2 99%   BMI 30.74 kg/m   Wt Readings from Last 3 Encounters:  09/03/23 233 lb (105.7 kg)  06/03/23 231 lb 9.6 oz (105.1 kg)  05/28/23 233 lb (105.7 kg)    Physical Exam Vitals and nursing note reviewed.  Constitutional:      General: He is not in acute distress.    Appearance: He is well-developed. He is not diaphoretic.  Eyes:     General: No scleral icterus.    Conjunctiva/sclera: Conjunctivae normal.  Neck:     Thyroid: No thyromegaly.  Cardiovascular:     Rate and Rhythm: Normal rate and regular rhythm.     Heart sounds: Normal heart sounds. No murmur heard. Pulmonary:     Effort: Pulmonary effort is normal. No respiratory distress.     Breath sounds: Normal breath sounds. No wheezing.  Musculoskeletal:        General: No swelling. Normal range of motion.     Cervical back: Neck supple.  Lymphadenopathy:     Cervical: No cervical adenopathy.  Skin:    General: Skin is warm and dry.     Findings: No rash.  Neurological:     Mental Status: He is alert and oriented to person, place, and time.     Coordination: Coordination normal.  Psychiatric:        Behavior: Behavior normal.       Assessment & Plan:   Problem List Items Addressed This Visit       Cardiovascular and Mediastinum   Hypertension associated with diabetes (HCC)   Relevant Medications   pravastatin (PRAVACHOL) 40 MG tablet     Digestive   GERD   Relevant Medications   omeprazole (PRILOSEC) 20 MG capsule     Endocrine   Type 2 diabetes mellitus with other specified complication (HCC) - Primary   Relevant Medications   pravastatin (PRAVACHOL) 40 MG tablet   Other Relevant Orders   Bayer DCA Hb A1c Waived   Other Visit Diagnoses     Diabetes mellitus treated with oral medication (HCC)       Relevant Medications   pravastatin (PRAVACHOL) 40 MG tablet     A1c 7.4  which is improved from last time.  Blood pressure looks decent.  No change in medication, continue focus on diet.  He wants to stay off the Januvia for now because of expense but will consider it again in the future  Follow up plan: Return in about 3 months (around 12/03/2023), or if symptoms worsen or fail to improve, for Diabetes recheck.  Counseling provided for all of the vaccine components Orders Placed This Encounter  Procedures  Bayer [facility] Hb A1c Waived    Arville Care, MD [facility] 09/03/2023, 9:56 AM

## 2023-09-03 NOTE — Telephone Encounter (Signed)
  Name from pharmacy: SITAGLIPTIN 100 MG TABLET   Pharmacy comment: Alternative Requested:[facility] OR [facility] IS PREFERRED. PLEASE SEND PRIOR AUTH OR AN ALTERNATIVE. THANKS!

## 2023-09-06 ENCOUNTER — Other Ambulatory Visit: Payer: Self-pay | Admitting: Family Medicine

## 2023-09-06 NOTE — Telephone Encounter (Signed)
  Name from pharmacy: SITAGLIPTIN 100 MG TABLET   Pharmacy comment: Alternative Requested:NOT COVERED BY INSURANCE.

## 2023-09-08 ENCOUNTER — Encounter: Payer: Self-pay | Admitting: Physical Therapy

## 2023-09-08 ENCOUNTER — Other Ambulatory Visit: Payer: Self-pay

## 2023-09-08 ENCOUNTER — Ambulatory Visit: Payer: Medicare Other | Attending: Family Medicine | Admitting: Physical Therapy

## 2023-09-08 DIAGNOSIS — M25512 Pain in left shoulder: Secondary | ICD-10-CM | POA: Diagnosis not present

## 2023-09-08 DIAGNOSIS — M25612 Stiffness of left shoulder, not elsewhere classified: Secondary | ICD-10-CM | POA: Insufficient documentation

## 2023-09-08 NOTE — Therapy (Signed)
OUTPATIENT PHYSICAL THERAPY SHOULDER EVALUATION   Patient Name: Craig Arnold MRN: [id number] DOB:15-Nov-1956, 67 y.o., male Today's Date: 09/08/2023  END OF SESSION:  PT End of Session - 09/08/23 1159     Visit Number 1    Number of Visits 12    Date for PT Re-Evaluation 10/20/23    Authorization Type FOTO.    PT Start Time 1101    PT Stop Time 1145    PT Time Calculation (min) 44 min    Activity Tolerance Patient tolerated treatment well    Behavior During Therapy WFL for tasks assessed/performed             Past Medical History:  Diagnosis Date   Allergic rhinitis    Asthma    Atypical mycobacterial infection    Bronchiectasis    COPD (chronic obstructive pulmonary disease) (HCC)    Cough    Diabetes mellitus without complication (HCC)    GERD (gastroesophageal reflux disease)    HTN (hypertension)    Pulmonary nodule    Past Surgical History:  Procedure Laterality Date   BASAL CELL CARCINOMA EXCISION     face   Patient Active Problem List   Diagnosis Date Noted   Fatigue 03/09/2023   Obesity (BMI 30.0-34.9) 06/20/2018   Type 2 diabetes mellitus with other specified complication (HCC) 07/13/2017   Allergic rhinitis due to pollen 02/20/2011   Bronchiectasis without complication (HCC) 12/18/2007   Pulmonary nodules 12/09/2007   Hypertension associated with diabetes (HCC) 10/08/2007   COPD (chronic obstructive pulmonary disease) (HCC) 10/08/2007   GERD 10/08/2007   REFERRING PROVIDER: Ivin Booty Dettinger MD  REFERRING DIAG: Acute pain of left shoulder.  THERAPY DIAG:  Acute pain of left shoulder  Stiffness of left shoulder, not elsewhere classified  Rationale for Evaluation and Treatment: Rehabilitation  ONSET DATE: ~3 months.  SUBJECTIVE:                                                                                                                                                                                      SUBJECTIVE STATEMENT: The patient  presents to the clinic with c/o left shoulder pain over the last three months due to sleeping in his recliner.  He reports his pain is low at rest but certain movements increase his pain to a 6-6+/10. He reports a loss of motion especially when going behind his back. He describes the pain as a sore, sharp and shooting.  He also experiences pain into his upper arm and in his shoulder blade region.  PERTINENT HISTORY: DM.  PAIN:  Are you having pain? Yes: NPRS scale: 3-4/10 Pain location: Left shoulder. Pain description: As  above. Aggravating factors: As above. Relieving factors: As above.  PRECAUTIONS: None   WEIGHT BEARING RESTRICTIONS: No  FALLS:  Has patient fallen in last 6 months? No  LIVING ENVIRONMENT: Lives in: House/apartment Has following equipment at home: None   PLOF: Independent  PATIENT GOALS:Use left shoulder without pain and regain motion.  NEXT MD VISIT:   OBJECTIVE:   PATIENT SURVEYS:  FOTO 56.2.   POSTURE: Forward head and rounded shoulders.  UPPER EXTREMITY ROM:   Active ROM Right eval Left eval  Shoulder flexion  135 degres  Shoulder internal rotation  To left SIJ  Shoulder external rotation Hypermobility 60 degrees    UPPER EXTREMITY MMT:  Normal left UE strength.  SHOULDER SPECIAL TESTS: Positive left shoulder Impingement testing.  PALPATION:  Patient c/o pain over his left Levator Scapulae muscle which was notable for an increase in tone.  He has some c/o pain over the bicipital groove and pain over the left middle deltoid region which appears to be referred in nature.  TODAY'S TREATMENT:                                                                                                                                         DATE: HMP and IFC at 80-150 Hz on 40% scan x 20 minutes. Normal modality response following removal of modality.   PATIENT EDUCATION:  HOME EXERCISE PROGRAM:  ASSESSMENT:  CLINICAL IMPRESSION: The patient  presents to [facility] with c/o left shoulder pain over the last three months after sleeping in a recliner.  He demonstrates positive left shoulder impingement testing.  He is lacking range of motion.  His strength is normal.  The patient c/o pain over his left Levator Scapulae muscle which was notable for an increase in tone.  He has some c/o pain over the bicipital groove and pain over the left middle deltoid region which appears to be referred in nature. His FOTO limitation score is 56.2.  Patient will benefit from skilled physical therapy intervention to address pain and deficits.  OBJECTIVE IMPAIRMENTS: decreased activity tolerance, decreased ROM, increased muscle spasms, postural dysfunction, and pain.   ACTIVITY LIMITATIONS: reach over head  PARTICIPATION LIMITATIONS: driving (pushing into steering wheel).  PERSONAL FACTORS: 1 comorbidity: DM  are also affecting patient's functional outcome.   REHAB POTENTIAL: Excellent  CLINICAL DECISION MAKING: Stable/uncomplicated  EVALUATION COMPLEXITY: Low   GOALS:  SHORT TERM GOALS: Target date: 09/22/23  Ind with an initial HEP. Goal status: INITIAL   LONG TERM GOALS: Target date: 12/07/23  Ind with an advanced HEP.  Goal status: INITIAL  2.  Active left shoulder flexion to 145 degrees so the patient can easily reach overhead.  Goal status: INITIAL  3.  Increase ROM so patient is able to reach behind back to L3.  Goal status: INITIAL  4.  Active ER to 80 degrees+ to allow for easily donning/doffing of  apparel.  Goal status: INITIAL  5.  Perform ADL's with pain not > 3/10.  Goal status: INITIAL    PLAN:  PT FREQUENCY: 2x/week  PT DURATION: 6 weeks  PLANNED INTERVENTIONS: Therapeutic exercises, Therapeutic activity, Patient/Family education, Self Care, Electrical stimulation, Cryotherapy, Moist heat, Vasopneumatic device, Ultrasound, and Manual therapy  PLAN FOR NEXT SESSION: Pulleys, Wall ladder, corner stretch, towel stretch  for behind back motion, PROM, modalities and STW/M as needed.   Adaleen Hulgan, [place], PT 09/08/2023, 12:23 PM

## 2023-09-12 ENCOUNTER — Other Ambulatory Visit: Payer: Self-pay | Admitting: Family Medicine

## 2023-09-15 ENCOUNTER — Ambulatory Visit: Payer: Medicare Other

## 2023-09-15 DIAGNOSIS — M25612 Stiffness of left shoulder, not elsewhere classified: Secondary | ICD-10-CM

## 2023-09-15 DIAGNOSIS — M25512 Pain in left shoulder: Secondary | ICD-10-CM | POA: Diagnosis not present

## 2023-09-22 ENCOUNTER — Ambulatory Visit: Payer: Medicare Other | Attending: Family Medicine | Admitting: Physical Therapy

## 2023-09-22 ENCOUNTER — Encounter: Payer: Self-pay | Admitting: Physical Therapy

## 2023-09-22 DIAGNOSIS — M25612 Stiffness of left shoulder, not elsewhere classified: Secondary | ICD-10-CM | POA: Diagnosis not present

## 2023-09-22 DIAGNOSIS — M25512 Pain in left shoulder: Secondary | ICD-10-CM | POA: Diagnosis not present

## 2023-09-22 NOTE — Therapy (Signed)
OUTPATIENT PHYSICAL THERAPY SHOULDER TREATMENT   Patient Name: Craig Arnold MRN: [id number] DOB:1956/02/10, 67 y.o., male Today's Date: 09/22/2023  END OF SESSION:  PT End of Session - 09/22/23 1323     Visit Number 3    Number of Visits 12    Date for PT Re-Evaluation 10/20/23    Authorization Type FOTO.    PT Start Time 1055    PT Stop Time 1146    PT Time Calculation (min) 51 min    Activity Tolerance Patient tolerated treatment well    Behavior During Therapy WFL for tasks assessed/performed             Past Medical History:  Diagnosis Date   Allergic rhinitis    Asthma    Atypical mycobacterial infection    Bronchiectasis    COPD (chronic obstructive pulmonary disease) (HCC)    Cough    Diabetes mellitus without complication (HCC)    GERD (gastroesophageal reflux disease)    HTN (hypertension)    Pulmonary nodule    Past Surgical History:  Procedure Laterality Date   BASAL CELL CARCINOMA EXCISION     face   Patient Active Problem List   Diagnosis Date Noted   Fatigue 03/09/2023   Obesity (BMI 30.0-34.9) 06/20/2018   Type 2 diabetes mellitus with other specified complication (HCC) 07/13/2017   Allergic rhinitis due to pollen 02/20/2011   Bronchiectasis without complication (HCC) 12/18/2007   Pulmonary nodules 12/09/2007   Hypertension associated with diabetes (HCC) 10/08/2007   COPD (chronic obstructive pulmonary disease) (HCC) 10/08/2007   GERD 10/08/2007   REFERRING PROVIDER: Ivin Booty Dettinger MD  REFERRING DIAG: Acute pain of left shoulder.  THERAPY DIAG:  Acute pain of left shoulder  Stiffness of left shoulder, not elsewhere classified  Rationale for Evaluation and Treatment: Rehabilitation  ONSET DATE: ~3 months.  SUBJECTIVE:                                                                                                                                                                                      SUBJECTIVE STATEMENT: Mowed three  lawns yesterday.  Shoulder getting better.  PERTINENT HISTORY: DM.  PAIN:  Are you having pain? Yes: NPRS scale: 3/10 Pain location: Left shoulder. Pain description: As above. Aggravating factors: As above. Relieving factors: As above.  PRECAUTIONS: None   WEIGHT BEARING RESTRICTIONS: No  FALLS:  Has patient fallen in last 6 months? No  LIVING ENVIRONMENT: Lives in: House/apartment Has following equipment at home: None   PLOF: Independent  PATIENT GOALS:Use left shoulder without pain and regain motion.  NEXT MD VISIT:   OBJECTIVE:   PATIENT SURVEYS:  FOTO 56.2.  POSTURE: Forward head and rounded shoulders.  UPPER EXTREMITY ROM:   Active ROM Right eval Left eval  Shoulder flexion  135 degres  Shoulder internal rotation  To left SIJ  Shoulder external rotation Hypermobility 60 degrees    UPPER EXTREMITY MMT:  Normal left UE strength.  SHOULDER SPECIAL TESTS: Positive left shoulder Impingement testing.  PALPATION:  Patient c/o pain over his left Levator Scapulae muscle which was notable for an increase in tone.  He has some c/o pain over the bicipital groove and pain over the left middle deltoid region which appears to be referred in nature.  TODAY'S TREATMENT:                                                                                                                                         DATE:   09/22/23:                                     EXERCISE LOG  Exercise Repetitions and Resistance Comments  UBE 120 RPM's x 8 minutes (4 mins for and 4 mins backward)   Pulleys  5 minutes               Combo e'stim/US at 1.50 W/CM2 x 8 minutes to patient's left anterior shoulder f/b STW/M x 5 minutes to patient's left Lev Scap, bicipital groove and middle deltoid region f/b HMP and IFC at 80-150 Hz on 40% scan x 20 minutes to patient's left shoulder.  Normal modality response following removal of modality.                                     EXERCISE  LOG  Exercise Repetitions and Resistance Comments  Pulley 5 mins   Ranger Flex/ext; CW and CW circles x 2 mins each   Wall Ladder 5 reps (max 33)   Corner Stretch 15 reps w 3 sec hold   Ball on Wall Flex x 2 mins   Towel Stretch         Blank cell = exercise not performed today   Manual Therapy Soft Tissue Mobilization: right deltoid and bicep, STW/M to right deltoid and bicep to decrease pain and tone with notable trigger points    Modalities  Date:  Unattended Estim: Shoulder, IFC 80-150 Hz, 15 mins, Pain Hot Pack: Shoulder, 15 mins, Pain and Tone    PATIENT EDUCATION:  HOME EXERCISE PROGRAM:  ASSESSMENT:  CLINICAL IMPRESSION: Patient's left shoulder is improving and he states he was able to mow 3 lawns yesterday on his Zero Turn mower without any significant increase in pain.  He did well with the addition of the UBE, performing without complaint.  OBJECTIVE IMPAIRMENTS: decreased activity tolerance, decreased ROM, increased muscle  spasms, postural dysfunction, and pain.   ACTIVITY LIMITATIONS: reach over head  PARTICIPATION LIMITATIONS: driving (pushing into steering wheel).  PERSONAL FACTORS: 1 comorbidity: DM  are also affecting patient's functional outcome.   REHAB POTENTIAL: Excellent  CLINICAL DECISION MAKING: Stable/uncomplicated  EVALUATION COMPLEXITY: Low   GOALS:  SHORT TERM GOALS: Target date: 09/22/23  Ind with an initial HEP. Goal status: INITIAL   LONG TERM GOALS: Target date: 12/07/23  Ind with an advanced HEP.  Goal status: INITIAL  2.  Active left shoulder flexion to 145 degrees so the patient can easily reach overhead.  Goal status: INITIAL  3.  Increase ROM so patient is able to reach behind back to L3.  Goal status: INITIAL  4.  Active ER to 80 degrees+ to allow for easily donning/doffing of apparel.  Goal status: INITIAL  5.  Perform ADL's with pain not > 3/10.  Goal status: INITIAL    PLAN:  PT FREQUENCY: 2x/week  PT  DURATION: 6 weeks  PLANNED INTERVENTIONS: Therapeutic exercises, Therapeutic activity, Patient/Family education, Self Care, Electrical stimulation, Cryotherapy, Moist heat, Vasopneumatic device, Ultrasound, and Manual therapy  PLAN FOR NEXT SESSION: Pulleys, Wall ladder, corner stretch, towel stretch for behind back motion, PROM, modalities and STW/M as needed.   Vashaun Osmon, [place], PT 09/22/2023, 1:37 PM

## 2023-09-29 ENCOUNTER — Encounter: Payer: Medicare Other | Admitting: Physical Therapy

## 2023-10-03 ENCOUNTER — Other Ambulatory Visit: Payer: Self-pay | Admitting: Emergency Medicine

## 2023-10-05 ENCOUNTER — Other Ambulatory Visit: Payer: Self-pay

## 2023-10-05 DIAGNOSIS — E1169 Type 2 diabetes mellitus with other specified complication: Secondary | ICD-10-CM

## 2023-10-06 ENCOUNTER — Ambulatory Visit: Payer: Medicare Other

## 2023-10-06 DIAGNOSIS — M25512 Pain in left shoulder: Secondary | ICD-10-CM | POA: Diagnosis not present

## 2023-10-06 DIAGNOSIS — M25612 Stiffness of left shoulder, not elsewhere classified: Secondary | ICD-10-CM | POA: Diagnosis not present

## 2023-10-06 NOTE — Therapy (Signed)
OUTPATIENT PHYSICAL THERAPY SHOULDER TREATMENT   Patient Name: Craig Arnold MRN: [id number] DOB:May 15, 1956, 67 y.o., male Today's Date: 10/06/2023  END OF SESSION:  PT End of Session - 10/06/23 1052     Visit Number 4    Number of Visits 12    Date for PT Re-Evaluation 10/20/23    Authorization Type FOTO.    PT Start Time 1050    PT Stop Time 1151    PT Time Calculation (min) 61 min    Activity Tolerance Patient tolerated treatment well    Behavior During Therapy WFL for tasks assessed/performed             Past Medical History:  Diagnosis Date   Allergic rhinitis    Asthma    Atypical mycobacterial infection    Bronchiectasis    COPD (chronic obstructive pulmonary disease) (HCC)    Cough    Diabetes mellitus without complication (HCC)    GERD (gastroesophageal reflux disease)    HTN (hypertension)    Pulmonary nodule    Past Surgical History:  Procedure Laterality Date   BASAL CELL CARCINOMA EXCISION     face   Patient Active Problem List   Diagnosis Date Noted   Fatigue 03/09/2023   Obesity (BMI 30.0-34.9) 06/20/2018   Type 2 diabetes mellitus with other specified complication (HCC) 07/13/2017   Allergic rhinitis due to pollen 02/20/2011   Bronchiectasis without complication (HCC) 12/18/2007   Pulmonary nodules 12/09/2007   Hypertension associated with diabetes (HCC) 10/08/2007   COPD (chronic obstructive pulmonary disease) (HCC) 10/08/2007   GERD 10/08/2007   REFERRING PROVIDER: Ivin Booty Dettinger MD  REFERRING DIAG: Acute pain of left shoulder.  THERAPY DIAG:  Acute pain of left shoulder  Stiffness of left shoulder, not elsewhere classified  Rationale for Evaluation and Treatment: Rehabilitation  ONSET DATE: ~3 months.  SUBJECTIVE:                                                                                                                                                                                      SUBJECTIVE STATEMENT: Pt denies  any pain at rest.  Intermittent 2/10 left shoulder pain with certain movements.  PERTINENT HISTORY: DM.  PAIN:  Are you having pain? Yes: NPRS scale: 2/10 Pain location: Left shoulder. Pain description: As above. Aggravating factors: As above. Relieving factors: As above.  PRECAUTIONS: None   WEIGHT BEARING RESTRICTIONS: No  FALLS:  Has patient fallen in last 6 months? No  LIVING ENVIRONMENT: Lives in: House/apartment Has following equipment at home: None   PLOF: Independent  PATIENT GOALS:Use left shoulder without pain and regain motion.  NEXT MD VISIT:   OBJECTIVE:  PATIENT SURVEYS:  FOTO 56.2.   POSTURE: Forward head and rounded shoulders.  UPPER EXTREMITY ROM:   Active ROM Right eval Left eval  Shoulder flexion  135 degres  Shoulder internal rotation  To left SIJ  Shoulder external rotation Hypermobility 60 degrees    UPPER EXTREMITY MMT:  Normal left UE strength.  SHOULDER SPECIAL TESTS: Positive left shoulder Impingement testing.  PALPATION:  Patient c/o pain over his left Levator Scapulae muscle which was notable for an increase in tone.  He has some c/o pain over the bicipital groove and pain over the left middle deltoid region which appears to be referred in nature.  TODAY'S TREATMENT:                                                                                                                                         DATE:   10/06/23:                                     EXERCISE LOG  Exercise Repetitions and Resistance Comments  UBE 120 RPM's x 10 minutes (5 mins for and 5 mins backward)   Pulleys  5 mins   Ball on Wall 3 mins   Flexion 3# x 20 reps    Scaption 3# x 20 reps   Abduction 3# x 20 reps   IR Stretch 5 reps x 30 secs    Manual Therapy Soft Tissue Mobilization: left shoulder , STW/M to left anterior shoulder musculature to decrease pain and tone    Modalities  Date:  Unattended Estim: Shoulder, IFC 80-150 Hz, 15  mins, Pain Hot Pack: Shoulder, 15 mins, Pain and Tone                                     EXERCISE LOG  Exercise Repetitions and Resistance Comments  Pulley 5 mins   Ranger Flex/ext; CW and CW circles x 2 mins each   Wall Ladder 5 reps (max 33)   Corner Stretch 15 reps w 3 sec hold   Ball on Wall Flex x 2 mins   Towel Stretch         Blank cell = exercise not performed today   Manual Therapy Soft Tissue Mobilization: right deltoid and bicep, STW/M to right deltoid and bicep to decrease pain and tone with notable trigger points    Modalities  Date:  Unattended Estim: Shoulder, IFC 80-150 Hz, 15 mins, Pain Hot Pack: Shoulder, 15 mins, Pain and Tone    PATIENT EDUCATION:  HOME EXERCISE PROGRAM:  ASSESSMENT:  CLINICAL IMPRESSION: Pt arrives for today's treatment session reporting 2/10 left shoulder pain with certain movements.  Pt able to increase FOTO score to 67.  Pt able to  demonstrate 160 degrees of left shoulder flexion today, meeting his long term goal.  Pt is making good progress towards all of goals at this time.  STW/M performed to left anterior shoulder musculature to decrease pain and tone.  Normal responses to estim and MH noted upon removal.  Pt denied any pain at completion of today's treatment session.  OBJECTIVE IMPAIRMENTS: decreased activity tolerance, decreased ROM, increased muscle spasms, postural dysfunction, and pain.   ACTIVITY LIMITATIONS: reach over head  PARTICIPATION LIMITATIONS: driving (pushing into steering wheel).  PERSONAL FACTORS: 1 comorbidity: DM  are also affecting patient's functional outcome.   REHAB POTENTIAL: Excellent  CLINICAL DECISION MAKING: Stable/uncomplicated  EVALUATION COMPLEXITY: Low   GOALS:  SHORT TERM GOALS: Target date: 09/22/23  Ind with an initial HEP. Goal status: MET   LONG TERM GOALS: Target date: 12/07/23  Ind with an advanced HEP.  Goal status: IN PROGRESS  2.  Active left shoulder flexion to 145  degrees so the patient can easily reach overhead.  Goal status: MET  3.  Increase ROM so patient is able to reach behind back to L3.  Goal status: IN PROGRESS  4.  Active ER to 80 degrees+ to allow for easily donning/doffing of apparel.  Goal status: IN PROGRESS  5.  Perform ADL's with pain not > 3/10.  Goal status: IN PROGRESS    PLAN:  PT FREQUENCY: 2x/week  PT DURATION: 6 weeks  PLANNED INTERVENTIONS: Therapeutic exercises, Therapeutic activity, Patient/Family education, Self Care, Electrical stimulation, Cryotherapy, Moist heat, Vasopneumatic device, Ultrasound, and Manual therapy  PLAN FOR NEXT SESSION: Pulleys, Wall ladder, corner stretch, towel stretch for behind back motion, PROM, modalities and STW/M as needed.   Newman Pies, PTA 10/06/2023, 11:59 AM

## 2023-10-26 ENCOUNTER — Other Ambulatory Visit: Payer: Self-pay | Admitting: Family Medicine

## 2023-10-26 DIAGNOSIS — E1159 Type 2 diabetes mellitus with other circulatory complications: Secondary | ICD-10-CM

## 2023-10-29 ENCOUNTER — Telehealth: Payer: Self-pay | Admitting: Family Medicine

## 2023-12-01 ENCOUNTER — Ambulatory Visit: Payer: Medicare Other | Admitting: Emergency Medicine

## 2023-12-06 ENCOUNTER — Encounter: Payer: Self-pay | Admitting: Family Medicine

## 2023-12-06 ENCOUNTER — Ambulatory Visit (INDEPENDENT_AMBULATORY_CARE_PROVIDER_SITE_OTHER): Payer: Medicare Other | Admitting: Family Medicine

## 2023-12-06 VITALS — BP 120/79 | HR 95 | Ht 73.0 in | Wt 242.0 lb

## 2023-12-06 DIAGNOSIS — E1169 Type 2 diabetes mellitus with other specified complication: Secondary | ICD-10-CM | POA: Diagnosis not present

## 2023-12-06 DIAGNOSIS — I152 Hypertension secondary to endocrine disorders: Secondary | ICD-10-CM

## 2023-12-06 DIAGNOSIS — E1159 Type 2 diabetes mellitus with other circulatory complications: Secondary | ICD-10-CM

## 2023-12-06 DIAGNOSIS — J449 Chronic obstructive pulmonary disease, unspecified: Secondary | ICD-10-CM | POA: Diagnosis not present

## 2023-12-06 DIAGNOSIS — Z7984 Long term (current) use of oral hypoglycemic drugs: Secondary | ICD-10-CM | POA: Diagnosis not present

## 2023-12-06 LAB — LIPID PANEL

## 2023-12-06 LAB — BAYER DCA HB A1C WAIVED: HB A1C (BAYER DCA - WAIVED): 8.4 % — ABNORMAL HIGH (ref 4.8–5.6)

## 2023-12-06 MED ORDER — OLMESARTAN MEDOXOMIL 20 MG PO TABS: 20.0000 mg | ORAL_TABLET | Freq: Every day | ORAL | 3 refills | Status: DC

## 2023-12-06 MED ORDER — DAPAGLIFLOZIN PROPANEDIOL 10 MG PO TABS: 10.0000 mg | ORAL_TABLET | Freq: Every day | ORAL | 5 refills | Status: DC

## 2023-12-06 MED ORDER — ACCU-CHEK SOFTCLIX LANCETS MISC: 3 refills | Status: DC

## 2023-12-06 MED ORDER — METFORMIN HCL 500 MG PO TABS
500.0000 mg | ORAL_TABLET | Freq: Two times a day (BID) | ORAL | 3 refills | Status: DC
Start: 2023-12-06 — End: 2024-01-18

## 2023-12-06 MED ORDER — EMPAGLIFLOZIN 25 MG PO TABS: 25.0000 mg | ORAL_TABLET | Freq: Every day | ORAL | Status: DC

## 2023-12-06 NOTE — Addendum Note (Signed)
Addended by: Dorene Sorrow on: 12/06/2023 01:41 PM   Modules accepted: Orders

## 2023-12-06 NOTE — Progress Notes (Signed)
BP 120/79   Pulse 95   Ht 6\' 1"  (1.854 m)   Wt 242 lb (109.8 kg)   SpO2 92%   BMI 31.93 kg/m    Subjective:   Patient ID: Craig Arnold, male    DOB: 1956/10/01, 67 y.o.   MRN: [id number]  HPI: Braden Furlong is a 67 y.o. male presenting on 12/06/2023 for Medical Management of Chronic Issues and Diabetes   HPI Type 2 diabetes mellitus Patient comes in today for recheck of his diabetes. Patient has been currently taking metformin. Patient is currently on an ACE inhibitor/ARB. Patient has seen an ophthalmologist this year. Patient denies any new issues with their feet. The symptom started onset as an adult hypertension and COPD ARE RELATED TO DM   Hypertension Patient is currently on olmesartan, and their blood pressure today is 120/79. Patient denies any lightheadedness or dizziness. Patient denies headaches, blurred vision, chest pains, shortness of breath, or weakness. Denies any side effects from medication and is content with current medication.   COPD Patient is coming in for COPD recheck today.  He is currently on Symbicort albuterol and Daliresp and prednisone, sees pulmonology.  He has a mild chronic cough but denies any major coughing spells or wheezing spells.  He has 1 nighttime symptoms per week and 4 daytime symptoms per week currently.   Relevant past medical, surgical, family and social history reviewed and updated as indicated. Interim medical history since our last visit reviewed. Allergies and medications reviewed and updated.  Review of Systems  Constitutional:  Negative for chills and fever.  HENT:  Negative for congestion.   Respiratory:  Positive for cough and wheezing. Negative for shortness of breath.   Cardiovascular:  Negative for chest pain and leg swelling.  Musculoskeletal:  Negative for back pain and gait problem.  Skin:  Negative for rash.  All other systems reviewed and are negative.   Per HPI unless specifically indicated above   Allergies as  of 12/06/2023   No Known Allergies      Medication List        Accurate as of December 06, 2023 11:10 AM. If you have any questions, ask your nurse or doctor.          STOP taking these medications    SITagliptin 100 MG Tabs Commonly known as: Zituvio Stopped by: Elige Radon Nahum Sherrer       TAKE these medications    Accu-Chek Guide test strip Generic drug: glucose blood Use as instructed   Accu-Chek Guide w/Device Kit Check BS 3 times a day Dx E11.69   Accu-Chek Softclix Lancet Dev Kit CHECK BLOOD SUGARS 3 TIMES A DAY Dx E11.69   Accu-Chek Softclix Lancets lancets CHECK BLOOD SUGAR 3 TIMES DAILY Dx E11.69   albuterol 108 (90 Base) MCG/ACT inhaler Commonly known as: VENTOLIN HFA INHALE 2 PUFFS INTO THE LUNGS EVERY 4 HOURS AS NEEDED   budesonide-formoterol 160-4.5 MCG/ACT inhaler Commonly known as: Symbicort INHALE 2 PUFFS INTO THE LUNGS TWICE A DAY   dapagliflozin propanediol 10 MG Tabs tablet Commonly known as: Farxiga Take 1 tablet (10 mg total) by mouth daily before breakfast. Started by: Elige Radon Jake Goodson   fluticasone 50 MCG/ACT nasal spray Commonly known as: FLONASE Place 1 spray into both nostrils 2 (two) times daily as needed for allergies or rhinitis.   loratadine 10 MG tablet Commonly known as: CLARITIN Take 10 mg by mouth daily.  [employer] brand   metFORMIN 500 MG tablet Commonly known  as: GLUCOPHAGE Take 1 tablet (500 mg total) by mouth 2 (two) times daily with a meal.   MULTIVITAMIN ADULTS 50+ PO Take by mouth.   olmesartan 20 MG tablet Commonly known as: BENICAR Take 1 tablet (20 mg total) by mouth daily.   omeprazole 20 MG capsule Commonly known as: PRILOSEC Take 1 capsule (20 mg total) by mouth daily.   pravastatin 40 MG tablet Commonly known as: PRAVACHOL Take 1 tablet (40 mg total) by mouth daily.   predniSONE 5 MG tablet Commonly known as: DELTASONE Take 1 tablet (5 mg total) by mouth daily with breakfast.    predniSONE 1 MG tablet Commonly known as: DELTASONE TAKE 3 TABLETS (3 MG TOTAL) BY MOUTH DAILY WITH BREAKFAST.   roflumilast 500 MCG Tabs tablet Commonly known as: DALIRESP TAKE 1 TABLET BY MOUTH DAILY         Objective:   BP 120/79   Pulse 95   Ht 6\' 1"  (1.854 m)   Wt 242 lb (109.8 kg)   SpO2 92%   BMI 31.93 kg/m   Wt Readings from Last 3 Encounters:  12/06/23 242 lb (109.8 kg)  09/03/23 233 lb (105.7 kg)  06/03/23 231 lb 9.6 oz (105.1 kg)    Physical Exam Vitals and nursing note reviewed.  Constitutional:      General: He is not in acute distress.    Appearance: He is well-developed. He is not diaphoretic.  Eyes:     General: No scleral icterus.    Conjunctiva/sclera: Conjunctivae normal.  Neck:     Thyroid: No thyromegaly.  Cardiovascular:     Rate and Rhythm: Normal rate and regular rhythm.     Heart sounds: Normal heart sounds. No murmur heard. Pulmonary:     Effort: Pulmonary effort is normal. No respiratory distress.     Breath sounds: Normal breath sounds. No wheezing, rhonchi or rales.  Musculoskeletal:        General: Normal range of motion.     Cervical back: Neck supple.  Lymphadenopathy:     Cervical: No cervical adenopathy.  Skin:    General: Skin is warm and dry.     Findings: No rash.  Neurological:     Mental Status: He is alert and oriented to person, place, and time.     Coordination: Coordination normal.  Psychiatric:        Behavior: Behavior normal.       Assessment & Plan:   Problem List Items Addressed This Visit       Cardiovascular and Mediastinum   Hypertension associated with diabetes (HCC)   Relevant Medications   metFORMIN (GLUCOPHAGE) 500 MG tablet   olmesartan (BENICAR) 20 MG tablet   dapagliflozin propanediol (FARXIGA) 10 MG TABS tablet   Other Relevant Orders   CBC with Differential/Platelet   CMP14+EGFR   Lipid panel   Bayer DCA Hb A1c Waived     Respiratory   COPD (chronic obstructive pulmonary  disease) (HCC)     Endocrine   Type 2 diabetes mellitus with other specified complication (HCC) - Primary   Relevant Medications   Accu-Chek Softclix Lancets lancets   metFORMIN (GLUCOPHAGE) 500 MG tablet   olmesartan (BENICAR) 20 MG tablet   dapagliflozin propanediol (FARXIGA) 10 MG TABS tablet   Other Relevant Orders   CBC with Differential/Platelet   CMP14+EGFR   Lipid panel   Bayer DCA Hb A1c Waived   AMB Referral [facility] Care Management    A1c is up to 8.4.  Will add Farxiga Blood pressure and everything else looks good. Follow up plan: Return in about 3 months (around 03/05/2024), or if symptoms worsen or fail to improve, for Diabetes and hypertension and COPD with.  Counseling provided for all of the vaccine components Orders Placed This Encounter  Procedures   CBC with Differential/Platelet   CMP14+EGFR   Lipid panel   Bayer DCA Hb A1c Waived   AMB Referral [facility] Care Management    Arville Care, MD [place] 12/06/2023, 11:10 AM

## 2023-12-07 LAB — LIPID PANEL
Cholesterol, Total: 185 mg/dL (ref 100–199)
HDL: 48 mg/dL (ref >39)
LDL CALC COMMENT:: 3.9 ratio (ref 0.0–5.0)
LDL Chol Calc (NIH): 97 mg/dL (ref 0–99)
Triglycerides: 238 mg/dL — ABNORMAL HIGH (ref 0–149)
VLDL Cholesterol Cal: 40 mg/dL (ref 5–40)

## 2023-12-07 LAB — CMP14+EGFR
ALT: 17 IU/L (ref 0–44)
AST: 16 IU/L (ref 0–40)
Albumin: 4.5 g/dL (ref 3.9–4.9)
Alkaline Phosphatase: 98 IU/L (ref 44–121)
BUN/Creatinine Ratio: 13 (ref 10–24)
BUN: 12 mg/dL (ref 8–27)
Bilirubin Total: 0.5 mg/dL (ref 0.0–1.2)
CO2: 19 mmol/L — ABNORMAL LOW (ref 20–29)
Calcium: 9.4 mg/dL (ref 8.6–10.2)
Chloride: 101 mmol/L (ref 96–106)
Creatinine, Ser: 0.96 mg/dL (ref 0.76–1.27)
Globulin, Total: 2.7 g/dL (ref 1.5–4.5)
Glucose: 208 mg/dL — ABNORMAL HIGH (ref 70–99)
Potassium: 4.8 mmol/L (ref 3.5–5.2)
Sodium: 139 mmol/L (ref 134–144)
Total Protein: 7.2 g/dL (ref 6.0–8.5)
eGFR: 87 mL/min/{1.73_m2} (ref >59)

## 2023-12-07 LAB — CBC WITH DIFFERENTIAL/PLATELET
Basophils Absolute: 0 10*3/uL (ref 0.0–0.2)
Basos: 1 %
EOS (ABSOLUTE): 0.5 10*3/uL — ABNORMAL HIGH (ref 0.0–0.4)
Eos: 7 %
Hematocrit: 41 % (ref 37.5–51.0)
Hemoglobin: 13.3 g/dL (ref 13.0–17.7)
Immature Grans (Abs): 0 10*3/uL (ref 0.0–0.1)
Immature Granulocytes: 1 %
Lymphocytes Absolute: 1.7 10*3/uL (ref 0.7–3.1)
Lymphs: 26 %
MCH: 28.9 pg (ref 26.6–33.0)
MCHC: 32.4 g/dL (ref 31.5–35.7)
MCV: 89 fL (ref 79–97)
Monocytes Absolute: 1.1 10*3/uL — ABNORMAL HIGH (ref 0.1–0.9)
Monocytes: 17 %
Neutrophils Absolute: 3.1 10*3/uL (ref 1.4–7.0)
Neutrophils: 48 %
Platelets: 190 10*3/uL (ref 150–450)
RBC: 4.61 x10E6/uL (ref 4.14–5.80)
RDW: 13 % (ref 11.6–15.4)
WBC: 6.5 10*3/uL (ref 3.4–10.8)

## 2023-12-08 ENCOUNTER — Telehealth: Payer: Self-pay

## 2023-12-08 NOTE — Progress Notes (Signed)
Care Guide Pharmacy Note  12/08/2023 Name: Craig Arnold MRN: [id number] DOB: April 03, 1956  Referred By: Dettinger, Elige Radon, MD Reason for referral: Care Coordination (Outreach to schedule with Pharm d )   Craig Arnold is a 67 y.o. year old male who is a primary care patient of Dettinger, Elige Radon, MD.  Craig Arnold was referred to the pharmacist for assistance related to: COPD and DMII  An unsuccessful telephone outreach was attempted today to contact the patient who was referred to the pharmacy team for assistance with medication management. Additional attempts will be made to contact the patient.  Penne Lash , RMA     [facility]  [facility] Guide  Direct Dial: [phone]  Website: [email or web address]

## 2023-12-17 NOTE — Progress Notes (Signed)
Care Guide Pharmacy Note  12/17/2023 Name: Kim Bolon MRN: [id number] DOB: 1956/09/02  Referred By: Nils Pyle, MD Reason for referral: Care Coordination (Outreach to schedule with Pharm d )   Bernardo Jodoin is a 67 y.o. year old male who is a primary care patient of Dettinger, Elige Radon, MD.  [name] was referred to the pharmacist for assistance related to: COPD and DMII  Successful contact was made with the patient to discuss pharmacy services including being ready for the pharmacist to call at least 5 minutes before the scheduled appointment time and to have medication bottles and any blood pressure readings ready for review. The patient agreed to meet with the pharmacist via telephone visit on (date/time).01/18/2024  Penne Lash , RMA     [place]  [facility], [facility] Guide  Direct Dial: [phone]  Website: [email or web address]

## 2023-12-19 ENCOUNTER — Other Ambulatory Visit: Payer: Self-pay | Admitting: Emergency Medicine

## 2023-12-21 ENCOUNTER — Other Ambulatory Visit: Payer: Self-pay | Admitting: Emergency Medicine

## 2024-01-17 NOTE — Progress Notes (Signed)
01/18/2024 Name: Craig Arnold MRN: [id number] DOB: 1956/03/29  Chief Complaint  Patient presents with   Diabetes    Craig Arnold is a 68 y.o. year old male who presented for a telephone visit.  I connected with  Craig Arnold on 01/18/24 by telephone and verified that I am speaking with the correct person using two identifiers. I discussed the limitations of evaluation and management by telemedicine. The patient expressed understanding and agreed to proceed.  Patient was located in her home and PharmD in PCP office during this visit.    They were referred to the pharmacist by their PCP for assistance in managing diabetes.    Subjective:  Care Team: Primary Care Provider: Dettinger, Elige Radon, MD ; Next Scheduled Visit: 03/06/2024  Medication Access/Adherence  Current Pharmacy:  [facility] [phone] - [place] - [address] [place] [address] Phone: [phone] Fax: [phone]   Patient reports affordability concerns with their medications: Yes  - Farxiga $300 copay Patient reports access/transportation concerns to their pharmacy: No  Patient reports adherence concerns with their medications:  Yes  - Did not start taking Jardiance samples due to concerns when looking the medication up online, cost is also a concern.   Diabetes:  Current medications: metformin 500 mg BID Medications tried in the past: Januvia (stopped d/t cost)  Patient reports he did not start taking Jardiance samples because he read about concerning side effects online including "death and UTI". Said that prior to his last PCP visit he had been eating lots of moon pies which he thinks contributed to higher A1c. Does not regularly check his BG at home, but says he does have testing supplies  Patient denies hypoglycemic s/sx including dizziness, shakiness, sweating. Patient denies hyperglycemic symptoms including polyuria, polydipsia, polyphagia, nocturia, neuropathy,  blurred vision.  Current meal patterns:  - baked meat, green beans, bread, peanut butter, moon pies, bologna sandwich - Drinks about 1 cup of water per day, zero sugar mountain dew, no alcohol   Hyperlipidemia/ASCVD Risk Reduction  Current lipid lowering medications: pravastatin 40 mg daily  ASCVD History: none Family History: father- heart attack, stroke; mother- diabetes; brother- diabetes, heart attack Risk Factors: T2DM, HTN, obesity  The 10-year ASCVD risk score (Arnett DK, et al., 2019) is: 26.8%   Values used to calculate the score:     Age: 32 years     Sex: Male     Is Non-Hispanic African American: No     Diabetic: Yes     Tobacco smoker: No     Systolic Blood Pressure: 120 mmHg     Is BP treated: Yes     HDL Cholesterol: 48 mg/dL     Total Cholesterol: 185 mg/dL   Objective:  Lab Results  Component Value Date   HGBA1C 8.4 (H) 12/06/2023    Lab Results  Component Value Date   CREATININE 0.96 12/06/2023   BUN 12 12/06/2023   NA 139 12/06/2023   K 4.8 12/06/2023   CL 101 12/06/2023   CO2 19 (L) 12/06/2023    Lab Results  Component Value Date   CHOL 185 12/06/2023   HDL 48 12/06/2023   LDLCALC 97 12/06/2023   LDLDIRECT 103.7 08/16/2007   TRIG 238 (H) 12/06/2023   CHOLHDL 3.9 12/06/2023    Medications Reviewed Today     Reviewed by Vela Prose, RPH (Pharmacist) on 01/18/24 at 1458  Med List Status: <None>   Medication Order Taking? Sig Documenting Provider  Last Dose Status Informant  Accu-Chek Softclix Lancets lancets [id number]  CHECK BLOOD SUGAR 3 TIMES DAILY Dx E11.69 Dettinger, Elige Radon, MD  Active   albuterol (VENTOLIN HFA) 108 (90 Base) MCG/ACT inhaler [id number] Yes INHALE 2 PUFFS BY MOUTH EVERY 4 HOURS AS NEEDED Leslye Peer, MD Taking Active   Blood Glucose Monitoring Suppl (ACCU-CHEK GUIDE) w/Device KIT [id number]  Check BS 3 times a day Dx E11.69 Dettinger, Elige Radon, MD  Active   budesonide-formoterol [facility]) 160-4.5 MCG/ACT  inhaler [id number] Yes INHALE 2 PUFFS INTO THE LUNGS TWICE A DAY Parrett, Tammy S, NP Taking Active   dapagliflozin propanediol (FARXIGA) 10 MG TABS tablet [id number]  Take 1 tablet (10 mg total) by mouth daily before breakfast. Dettinger, Elige Radon, MD  Active   empagliflozin (JARDIANCE) 25 MG TABS tablet [id number]  Take 1 tablet (25 mg total) by mouth daily before breakfast. Dettinger, Elige Radon, MD  Active   fluticasone (FLONASE) 50 MCG/ACT nasal spray [id number] Yes Place 1 spray into both nostrils 2 (two) times daily as needed for allergies or rhinitis. Dettinger, Elige Radon, MD Taking Active   glucose blood (ACCU-CHEK GUIDE) test strip [id number]  Use as instructed Dettinger, Elige Radon, MD  Active   Lancets Misc. (ACCU-CHEK SOFTCLIX LANCET DEV) KIT [id number]  CHECK BLOOD SUGARS 3 TIMES A DAY Dx E11.69 Dettinger, Elige Radon, MD  Active   loratadine (CLARITIN) 10 MG tablet [id number] Yes Take 10 mg by mouth daily.  [employer] brand [provider] Taking Active   metFORMIN (GLUCOPHAGE) 500 MG tablet [id number] Yes Take 1 tablet (500 mg total) by mouth 2 (two) times daily with a meal. Dettinger, Elige Radon, MD Taking Active   Multiple Vitamins-Minerals (MULTIVITAMIN ADULTS 50+ PO) [id number] Yes Take by mouth. [provider] Taking Active   olmesartan (BENICAR) 20 MG tablet [id number] Yes Take 1 tablet (20 mg total) by mouth daily. Dettinger, Elige Radon, MD Taking Active   omeprazole (PRILOSEC) 20 MG capsule [id number] Yes Take 1 capsule (20 mg total) by mouth daily. Dettinger, Elige Radon, MD Taking Active   pravastatin (PRAVACHOL) 40 MG tablet [id number] Yes Take 1 tablet (40 mg total) by mouth daily. Dettinger, Elige Radon, MD Taking Active   predniSONE (DELTASONE) 1 MG tablet [id number]  TAKE 3 TABLETS (3 MG TOTAL) BY MOUTH DAILY WITH BREAKFAST. Leslye Peer, MD  Active   roflumilast (DALIRESP) 500 MCG TABS tablet [id number] Yes TAKE 1 TABLET BY MOUTH EVERY DAY Byrum, Les Pou, MD Taking Active                Assessment/Plan:   Diabetes: - Currently uncontrolled with A1c 8.4%, above goal <7%.  - Reviewed long term cardiovascular and renal outcomes of uncontrolled blood sugar - Reviewed goal A1c, goal fasting, and goal 2 hour post prandial glucose - Reviewed dietary modifications including limiting moon pies, limiting portions of carbohydrates, incorporating more protein and fresh vegetables  - Recommend to increase metformin to 1000 mg twice daily - Recommended to start taking Jardiance samples provided at last PCP visit, but patient declined. Patient concerned about side effects including UTIs. Discussed that risk of UTI is low with Jardiance and that he would be considered very low risk for this side effect given male gender and A1c <10%. He agreed to increasing the dose of metformin, working on his diet, and monitoring BG at home for now. If elevated at pharmacist follow up visit in 1 month, he agreed to try Jardiance. If he starts on  SGLT-2, would need to submit patient assistance application.  - Recommend to check FBG and 2 hr PP BG at least a few times per week - Next A1c in 02/2024    Hyperlipidemia/ASCVD Risk Reduction: - Currently uncontrolled based on LDL 97 mg/dl, above goal <16 mg/dl and TG elevated at 109 mg/dl. - Reviewed long term complications of uncontrolled cholesterol - Reviewed dietary recommendations including limiting intake of processed foods and meats. - Recommend to stop pravastatin and start rosuvastatin 20 mg daily.  - F/u lipid panel due at next PCP visit in March    Follow Up Plan: PharmD on 02/15/24 and PCP on 03/06/24   Jarrett Ables, PharmD PGY-1 Pharmacy Resident   Kieth Brightly, PharmD, BCACP, CPP Clinical Pharmacist, [facility]

## 2024-01-18 ENCOUNTER — Other Ambulatory Visit (INDEPENDENT_AMBULATORY_CARE_PROVIDER_SITE_OTHER): Payer: Medicare Other | Admitting: Pharmacist

## 2024-01-18 ENCOUNTER — Other Ambulatory Visit (HOSPITAL_COMMUNITY): Payer: Self-pay

## 2024-01-18 ENCOUNTER — Other Ambulatory Visit: Payer: Self-pay | Admitting: Emergency Medicine

## 2024-01-18 DIAGNOSIS — E1169 Type 2 diabetes mellitus with other specified complication: Secondary | ICD-10-CM

## 2024-01-18 DIAGNOSIS — Z7984 Long term (current) use of oral hypoglycemic drugs: Secondary | ICD-10-CM

## 2024-01-18 MED ORDER — ROSUVASTATIN CALCIUM 20 MG PO TABS: 20.0000 mg | ORAL_TABLET | Freq: Every day | ORAL | 3 refills | Status: DC

## 2024-01-18 MED ORDER — METFORMIN HCL 1000 MG PO TABS: 1000.0000 mg | ORAL_TABLET | Freq: Two times a day (BID) | ORAL | 3 refills | Status: DC

## 2024-01-25 ENCOUNTER — Ambulatory Visit: Payer: Medicare Other

## 2024-01-25 VITALS — Ht 73.0 in | Wt 242.0 lb

## 2024-01-25 DIAGNOSIS — Z0000 Encounter for general adult medical examination without abnormal findings: Secondary | ICD-10-CM | POA: Diagnosis not present

## 2024-01-25 NOTE — Patient Instructions (Signed)
 Mr. Federici , Thank you for taking time to come for your Medicare Wellness Visit. I appreciate your ongoing commitment to your health goals. Please review the following plan we discussed and let me know if I can assist you in the future.   Referrals/Orders/Follow-Ups/Clinician Recommendations: Aim for 30 minutes of exercise or brisk walking, 6-8 glasses of water , and 5 servings of fruits and vegetables each day.  This is a list of the screening recommended for you and due dates:  Health Maintenance  Topic Date Due   Yearly kidney health urinalysis for diabetes  11/25/2023   Eye exam for diabetics  12/10/2023   Zoster (Shingles) Vaccine (1 of 2) 02/24/2024*   Flu Shot  03/20/2024*   Pneumonia Vaccine (1 of 2 - PCV) 05/27/2024*   Colon Cancer Screening  05/27/2024*   DTaP/Tdap/Td vaccine (2 - Tdap) 12/05/2024*   Complete foot exam   02/24/2024   Hemoglobin A1C  06/05/2024   Yearly kidney function blood test for diabetes  12/05/2024   Medicare Annual Wellness Visit  01/24/2025   Hepatitis C Screening  Completed   HPV Vaccine  Aged Out   Screening for Lung Cancer  Discontinued   COVID-19 Vaccine  Discontinued   Cologuard (Stool DNA test)  Discontinued  *Topic was postponed. The date shown is not the original due date.    Advanced directives: (ACP Link)Information on Advanced Care Planning can be found at   Secretary of [facility] Advance Health Care Directives Advance Health Care Directives ([email or web address]   Next Medicare Annual Wellness Visit scheduled for next year: Yes

## 2024-01-25 NOTE — Progress Notes (Signed)
 Subjective:   Craig Arnold is a 68 y.o. male who presents for Medicare Annual/Subsequent preventive examination.  Visit Complete: Virtual I connected with  Craig Arnold on 01/25/24 by a audio enabled telemedicine application and verified that I am speaking with the correct person using two identifiers.  Patient Location: Home  Provider Location: Home Office  This patient declined Interactive audio and video telecommunications. Therefore the visit was completed with audio only.  I discussed the limitations of evaluation and management by telemedicine. The patient expressed understanding and agreed to proceed.  Vital Signs: Because this visit was a virtual/telehealth visit, some criteria may be missing or patient reported. Any vitals not documented were not able to be obtained and vitals that have been documented are patient reported.  Cardiac Risk Factors include: advanced age (>37men, >61 women);dyslipidemia;hypertension;male gender     Objective:    Today's Vitals   01/25/24 1332  Weight: 242 lb (109.8 kg)  Height: 6' 1 (1.854 m)   Body mass index is 31.93 kg/m.     01/25/2024    1:36 PM 09/08/2023   12:00 PM 01/22/2023    8:58 AM 01/21/2022    9:49 AM  Advanced Directives  Does Patient Have a Medical Advance Directive? No No No No  Would patient like information on creating a medical advance directive? Yes (MAU/Ambulatory/Procedural Areas - Information given)  No - Patient declined No - Patient declined    Current Medications (verified) Outpatient Encounter Medications as of 01/25/2024  Medication Sig   Accu-Chek Softclix Lancets lancets CHECK BLOOD SUGAR 3 TIMES DAILY Dx E11.69   albuterol  (VENTOLIN  HFA) 108 (90 Base) MCG/ACT inhaler INHALE 2 PUFFS BY MOUTH EVERY 4 HOURS AS NEEDED   Blood Glucose Monitoring Suppl (ACCU-CHEK GUIDE) w/Device KIT Check BS 3 times a day Dx E11.69   budesonide -formoterol  (SYMBICORT ) 160-4.5 MCG/ACT inhaler INHALE 2 PUFFS INTO THE LUNGS TWICE A  DAY   dapagliflozin  propanediol (FARXIGA ) 10 MG TABS tablet Take 1 tablet (10 mg total) by mouth daily before breakfast.   empagliflozin  (JARDIANCE ) 25 MG TABS tablet Take 1 tablet (25 mg total) by mouth daily before breakfast.   fluticasone  (FLONASE ) 50 MCG/ACT nasal spray Place 1 spray into both nostrils 2 (two) times daily as needed for allergies or rhinitis.   glucose blood (ACCU-CHEK GUIDE) test strip Use as instructed   Lancets Misc. (ACCU-CHEK SOFTCLIX LANCET DEV) KIT CHECK BLOOD SUGARS 3 TIMES A DAY Dx E11.69   loratadine (CLARITIN) 10 MG tablet Take 10 mg by mouth daily.  [employer] brand   metFORMIN  (GLUCOPHAGE ) 1000 MG tablet Take 1 tablet (1,000 mg total) by mouth 2 (two) times daily with a meal.   Multiple Vitamins-Minerals (MULTIVITAMIN ADULTS 50+ PO) Take by mouth.   olmesartan  (BENICAR ) 20 MG tablet Take 1 tablet (20 mg total) by mouth daily.   omeprazole  (PRILOSEC) 20 MG capsule Take 1 capsule (20 mg total) by mouth daily.   predniSONE  (DELTASONE ) 1 MG tablet TAKE 3 TABLETS (3 MG TOTAL) BY MOUTH DAILY WITH BREAKFAST.   roflumilast  (DALIRESP ) 500 MCG TABS tablet TAKE 1 TABLET BY MOUTH EVERY DAY   rosuvastatin  (CRESTOR ) 20 MG tablet Take 1 tablet (20 mg total) by mouth daily.   No facility-administered encounter medications on file as of 01/25/2024.    Allergies (verified) Patient has no known allergies.   History: Past Medical History:  Diagnosis Date   Allergic rhinitis    Asthma    Atypical mycobacterial infection    Bronchiectasis  COPD (chronic obstructive pulmonary disease) (HCC)    Cough    Diabetes mellitus without complication (HCC)    GERD (gastroesophageal reflux disease)    HTN (hypertension)    Pulmonary nodule    Past Surgical History:  Procedure Laterality Date   BASAL CELL CARCINOMA EXCISION     face   Family History  Problem Relation Age of Onset   Emphysema Father    COPD Father    Heart disease Father    Stroke Father    Heart attack  Father    Heart disease Paternal Uncle    Heart disease Maternal Grandfather    Rheum arthritis Mother    Diabetes Mother    Diabetes Brother    Heart attack Brother    Diabetes Brother    Social History   Socioeconomic History   Marital status: Widowed    Spouse name: Not on file   Number of children: 2   Years of education: Not on file   Highest education level: Not on file  Occupational History   Occupation: landscaping    Comment: retired  Tobacco Use   Smoking status: Former    Current packs/day: 0.00    Average packs/day: 1 pack/day for 40.0 years (40.0 ttl pk-yrs)    Types: Cigarettes    Start date: 07/21/1968    Quit date: 07/21/2008    Years since quitting: 15.5   Smokeless tobacco: Never  Vaping Use   Vaping status: Never Used  Substance and Sexual Activity   Alcohol use: No   Drug use: No   Sexual activity: Never  Other Topics Concern   Not on file  Social History Narrative   Wife passed away 2020-01-01 from Covid + pneumonia   Children in [place] and [place]   Social Drivers of Health   Financial Resource Strain: Low Risk  (01/25/2024)   Overall Financial Resource Strain (CARDIA)    Difficulty of Paying Living Expenses: Not hard at all  Food Insecurity: No Food Insecurity (01/25/2024)   Hunger Vital Sign    Worried About Running Out of Food in the Last Year: Never true    Ran Out of Food in the Last Year: Never true  Transportation Needs: No Transportation Needs (01/25/2024)   PRAPARE - Administrator, Civil Service (Medical): No    Lack of Transportation (Non-Medical): No  Physical Activity: Sufficiently Active (01/25/2024)   Exercise Vital Sign    Days of Exercise per Week: 5 days    Minutes of Exercise per Session: 30 min  Stress: No Stress Concern Present (01/25/2024)   [employer] of Occupational Health - Occupational Stress Questionnaire    Feeling of Stress : Not at all  Social Connections: Moderately Isolated (01/25/2024)   Social  Connection and Isolation Panel [NHANES]    Frequency of Communication with Friends and Family: More than three times a week    Frequency of Social Gatherings with Friends and Family: Three times a week    Attends Religious Services: Never    Active Member of Clubs or Organizations: No    Attends Engineer, Structural: More than 4 times per year    Marital Status: Widowed    Tobacco Counseling Counseling given: Not Answered   Clinical Intake:  Pre-visit preparation completed: Yes  Pain : No/denies pain  Diabetes: Yes CBG done?: No Did pt. bring in CBG monitor from home?: No  How often do you need to have someone help you when you  read instructions, pamphlets, or other written materials from your doctor or pharmacy?: 1 - Never  Interpreter Needed?: No  Information entered by :: Charmaine Bloodgood LPN   Activities of Daily Living    01/25/2024    1:32 PM  In your present state of health, do you have any difficulty performing the following activities:  Hearing? 0  Vision? 0  Difficulty concentrating or making decisions? 0  Walking or climbing stairs? 0  Dressing or bathing? 0  Doing errands, shopping? 0  Preparing Food and eating ? N  Using the Toilet? N  In the past six months, have you accidently leaked urine? N  Do you have problems with loss of bowel control? N  Managing your Medications? N  Managing your Finances? N  Housekeeping or managing your Housekeeping? N    Patient Care Team: Dettinger, Fonda LABOR, MD as PCP - General (Family Medicine) Vicci Mcardle, OD (Optometry)  Indicate any recent Medical Services you may have received from other than [facility] providers in the past year (date may be approximate).     Assessment:   This is a routine wellness examination for [place].  Hearing/Vision screen Hearing Screening - Comments:: Denies hearing difficulties   Vision Screening - Comments:: Wears rx glasses - up to date with routine eye exams with [facility]  [facility]     Goals Addressed             This Visit's Progress    COMPLETED: Patient Stated       Stay healthy and active Increase water      Remain active and independent        Depression Screen    01/25/2024    1:35 PM 12/06/2023   10:56 AM 09/03/2023    9:56 AM 05/28/2023   10:38 AM 02/24/2023    9:49 AM 01/22/2023    8:56 AM 11/23/2022    9:50 AM  PHQ 2/9 Scores  PHQ - 2 Score 0 0 0 0 0 0 0  PHQ- 9 Score     0 0 0    Fall Risk    01/25/2024    1:37 PM 12/06/2023   10:57 AM 09/03/2023    9:56 AM 05/28/2023   10:38 AM 02/24/2023    9:49 AM  Fall Risk   Falls in the past year? 0 0 0 0 0  Number falls in past yr: 0      Injury with Fall? 0      Risk for fall due to : No Fall Risks      Follow up Falls prevention discussed;Education provided;Falls evaluation completed        MEDICARE RISK AT HOME: Medicare Risk at Home Any stairs in or around the home?: No If so, are there any without handrails?: No Home free of loose throw rugs in walkways, pet beds, electrical cords, etc?: Yes Adequate lighting in your home to reduce risk of falls?: Yes Life alert?: No Use of a cane, walker or w/c?: No Grab bars in the bathroom?: Yes Shower chair or bench in shower?: No Elevated toilet seat or a handicapped toilet?: Yes  TIMED UP AND GO:  Was the test performed?  No    Cognitive Function:        01/25/2024    1:37 PM 01/22/2023    8:59 AM 01/21/2022    9:54 AM  6CIT Screen  What Year? 0 points 0 points 0 points  What month? 0 points 0 points 0 points  What time? 0 points 0 points 0 points  Count back from 20 0 points 0 points 0 points  Months in reverse 0 points 0 points 4 points  Repeat phrase 0 points 0 points 2 points  Total Score 0 points 0 points 6 points    Immunizations Immunization History  Administered Date(s) Administered   Td 08/16/2007    TDAP status: Due, Education has been provided regarding the importance of this vaccine. Advised may receive this vaccine  at local pharmacy or Health Dept. Aware to provide a copy of the vaccination record if obtained from local pharmacy or Health Dept. Verbalized acceptance and understanding.  Flu Vaccine status: Declined, Education has been provided regarding the importance of this vaccine but patient still declined. Advised may receive this vaccine at local pharmacy or Health Dept. Aware to provide a copy of the vaccination record if obtained from local pharmacy or Health Dept. Verbalized acceptance and understanding.  Pneumococcal vaccine status: Declined,  Education has been provided regarding the importance of this vaccine but patient still declined. Advised may receive this vaccine at local pharmacy or Health Dept. Aware to provide a copy of the vaccination record if obtained from local pharmacy or Health Dept. Verbalized acceptance and understanding.   Covid-19 vaccine status: Declined, Education has been provided regarding the importance of this vaccine but patient still declined. Advised may receive this vaccine at local pharmacy or Health Dept.or vaccine clinic. Aware to provide a copy of the vaccination record if obtained from local pharmacy or Health Dept. Verbalized acceptance and understanding.  Qualifies for Shingles Vaccine? Yes   Zostavax completed No   Shingrix Completed?: No.    Education has been provided regarding the importance of this vaccine. Patient has been advised to call insurance company to determine out of pocket expense if they have not yet received this vaccine. Advised may also receive vaccine at local pharmacy or Health Dept. Verbalized acceptance and understanding.  Screening Tests Health Maintenance  Topic Date Due   Diabetic kidney evaluation - Urine ACR  11/25/2023   OPHTHALMOLOGY EXAM  12/10/2023   Zoster Vaccines- Shingrix (1 of 2) 02/24/2024 (Originally 01/08/2007)   INFLUENZA VACCINE  03/20/2024 (Originally 07/22/2023)   Pneumonia Vaccine 5+ Years old (1 of 2 - PCV) 05/27/2024  (Originally 01/08/1962)   Colonoscopy  05/27/2024 (Originally 03/10/2023)   DTaP/Tdap/Td (2 - Tdap) 12/05/2024 (Originally 08/15/2017)   FOOT EXAM  02/24/2024   HEMOGLOBIN A1C  06/05/2024   Diabetic kidney evaluation - eGFR measurement  12/05/2024   Medicare Annual Wellness (AWV)  01/24/2025   Hepatitis C Screening  Completed   HPV VACCINES  Aged Out   Lung Cancer Screening  Discontinued   COVID-19 Vaccine  Discontinued   Fecal DNA (Cologuard)  Discontinued    Health Maintenance  Health Maintenance Due  Topic Date Due   Diabetic kidney evaluation - Urine ACR  11/25/2023   OPHTHALMOLOGY EXAM  12/10/2023    Colorectal cancer screening: Type of screening: Colonoscopy. Completed 03/09/22. Repeat every 1 years  Lung Cancer Screening: (Low Dose CT Chest recommended if Age 86-80 years, 20 pack-year currently smoking OR have quit w/in 15years.) does qualify.   Lung Cancer Screening Referral: last 03/17/23  Additional Screening:  Hepatitis C Screening: does qualify; Completed 12/17/17  Vision Screening: Recommended annual ophthalmology exams for early detection of glaucoma and other disorders of the eye. Is the patient up to date with their annual eye exam?  Yes  Who is the provider or what is  the name of the office in which the patient attends annual eye exams? [facility]  If pt is not established with a provider, would they like to be referred to a provider to establish care? No .   Dental Screening: Recommended annual dental exams for proper oral hygiene  Diabetic Foot Exam: Diabetic Foot Exam: Completed 02/24/23  Community Resource Referral / Chronic Care Management: CRR required this visit?  No   CCM required this visit?  No     Plan:     I have personally reviewed and noted the following in the patient's chart:   Medical and social history Use of alcohol, tobacco or illicit drugs  Current medications and supplements including opioid prescriptions. Patient is not  currently taking opioid prescriptions. Functional ability and status Nutritional status Physical activity Advanced directives List of other physicians Hospitalizations, surgeries, and ER visits in previous 12 months Vitals Screenings to include cognitive, depression, and falls Referrals and appointments  In addition, I have reviewed and discussed with patient certain preventive protocols, quality metrics, and best practice recommendations. A written personalized care plan for preventive services as well as general preventive health recommendations were provided to patient.     Lavelle Pfeiffer [place], [place]   06/24/7973   After Visit Summary: (Mail) Due to this being a telephonic visit, the after visit summary with patients personalized plan was offered to patient via mail   Nurse Notes: No concerns at this time

## 2024-02-03 LAB — HM DIABETES EYE EXAM

## 2024-02-15 ENCOUNTER — Other Ambulatory Visit (INDEPENDENT_AMBULATORY_CARE_PROVIDER_SITE_OTHER): Payer: Medicare Other | Admitting: Pharmacist

## 2024-02-15 DIAGNOSIS — Z7984 Long term (current) use of oral hypoglycemic drugs: Secondary | ICD-10-CM

## 2024-02-15 DIAGNOSIS — E1169 Type 2 diabetes mellitus with other specified complication: Secondary | ICD-10-CM

## 2024-02-15 NOTE — Progress Notes (Unsigned)
 02/15/2024 Name: Craig Arnold MRN: [id number] DOB: 1956/05/31  Chief Complaint  Patient presents with   Diabetes   Hyperlipidemia    Craig Arnold is a 68 y.o. year old male who presented for a telephone visit.   They were referred to the pharmacist by their PCP for assistance in managing diabetes and hyperlipidemia.    Subjective:  Care Team: Primary Care Provider: Dettinger, Elige Radon, MD ; Next Scheduled Visit: 03/06/2024  Medication Access/Adherence  Current Pharmacy:  [facility] [phone] - [place], [place] - [address] [place] [address] Phone: [phone] Fax: [phone]   Patient reports affordability concerns with their medications: Yes  - Farxiga $300 copay Patient reports access/transportation concerns to their pharmacy: No  Patient reports adherence concerns with their medications:  Yes     Diabetes:  Current medications: metformin 1000 mg BID (patient reports taking 500mg  - 2 tablets in AM and 1 tablet in PM as he is concerned taking 1000 mg twice a day will cause low blood sugar) Medications tried in the past: Januvia (stopped d/t cost)   Current glucose readings:  FBG: 138, 139, usually 130s After breakfast: 140-150  Patient denies hypoglycemic s/sx including dizziness, shakiness, sweating. Patient denies hyperglycemic symptoms including polyuria, polydipsia, polyphagia, nocturia, neuropathy, blurred vision.  Current meal patterns:  - [employer] biscuit in AM - baked meat, green beans, bread, peanut butter, moon pies, bologna sandwich - Occasional no sugar added ice cream, has cut out moon pies - Has increased water intake recently - drinking ~2 cups of water per day, zero sugar mountain dew, no alcohol  Hyperlipidemia/ASCVD Risk Reduction  Current lipid lowering medications: rosuvastatin 20 mg daily  ASCVD History: none Family History: father- heart attack, stroke; mother- diabetes; brother- diabetes, heart  attack Risk Factors: T2DM, HTN, obesity   Objective:  Lab Results  Component Value Date   HGBA1C 8.4 (H) 12/06/2023    Lab Results  Component Value Date   CREATININE 0.96 12/06/2023   BUN 12 12/06/2023   NA 139 12/06/2023   K 4.8 12/06/2023   CL 101 12/06/2023   CO2 19 (L) 12/06/2023    Lab Results  Component Value Date   CHOL 185 12/06/2023   HDL 48 12/06/2023   LDLCALC 97 12/06/2023   LDLDIRECT 103.7 08/16/2007   TRIG 238 (H) 12/06/2023   CHOLHDL 3.9 12/06/2023    Medications Reviewed Today     Reviewed by Vela Prose, RPH (Pharmacist) on 02/15/24 at 1452  Med List Status: <None>   Medication Order Taking? Sig Documenting Provider Last Dose Status Informant  Accu-Chek Softclix Lancets lancets [id number] No CHECK BLOOD SUGAR 3 TIMES DAILY Dx E11.69 Dettinger, Elige Radon, MD Taking Active   albuterol (VENTOLIN HFA) 108 (90 Base) MCG/ACT inhaler [id number] No INHALE 2 PUFFS BY MOUTH EVERY 4 HOURS AS NEEDED Byrum, Les Pou, MD Taking Active   Blood Glucose Monitoring Suppl (ACCU-CHEK GUIDE) w/Device KIT [id number] No Check BS 3 times a day Dx E11.69 Dettinger, Elige Radon, MD Taking Active   budesonide-formoterol [facility]) 160-4.5 MCG/ACT inhaler [id number] No INHALE 2 PUFFS INTO THE LUNGS TWICE A DAY Parrett, Tammy S, NP Taking Active   dapagliflozin propanediol (FARXIGA) 10 MG TABS tablet [id number] No Take 1 tablet (10 mg total) by mouth daily before breakfast. Dettinger, Elige Radon, MD Taking Active   empagliflozin (JARDIANCE) 25 MG TABS tablet [id number] No Take 1 tablet (25 mg total) by mouth daily before breakfast. Dettinger, Elige Radon, MD Taking Active  fluticasone (FLONASE) 50 MCG/ACT nasal spray [id number] No Place 1 spray into both nostrils 2 (two) times daily as needed for allergies or rhinitis. Dettinger, Elige Radon, MD Taking Active   glucose blood (ACCU-CHEK GUIDE) test strip [id number] No Use as instructed Dettinger, Elige Radon, MD Taking Active   Lancets Misc.  (ACCU-CHEK SOFTCLIX LANCET DEV) KIT [id number] No CHECK BLOOD SUGARS 3 TIMES A DAY Dx E11.69 Dettinger, Elige Radon, MD Taking Active   loratadine (CLARITIN) 10 MG tablet [id number] No Take 10 mg by mouth daily.  [employer] brand [provider] Taking Active   metFORMIN (GLUCOPHAGE) 1000 MG tablet [id number] No Take 1 tablet (1,000 mg total) by mouth 2 (two) times daily with a meal. Dettinger, Elige Radon, MD Taking Active   Multiple Vitamins-Minerals (MULTIVITAMIN ADULTS 50+ PO) [id number] No Take by mouth. [provider] Taking Active   olmesartan (BENICAR) 20 MG tablet [id number] No Take 1 tablet (20 mg total) by mouth daily. Dettinger, Elige Radon, MD Taking Active   omeprazole (PRILOSEC) 20 MG capsule [id number] No Take 1 capsule (20 mg total) by mouth daily. Dettinger, Elige Radon, MD Taking Active   predniSONE (DELTASONE) 1 MG tablet [id number] No TAKE 3 TABLETS (3 MG TOTAL) BY MOUTH DAILY WITH BREAKFAST. Leslye Peer, MD Taking Active   roflumilast (DALIRESP) 500 MCG TABS tablet [id number] No TAKE 1 TABLET BY MOUTH EVERY DAY Byrum, Les Pou, MD Taking Active   rosuvastatin (CRESTOR) 20 MG tablet [id number] No Take 1 tablet (20 mg total) by mouth daily. Dettinger, Elige Radon, MD Taking Active               Assessment/Plan:   Diabetes: - Currently uncontrolled with A1c 8.4%, above goal <7%. Patient reporting BG are slightly elevated as well. - Reviewed long term cardiovascular and renal outcomes of uncontrolled blood sugar - Reviewed goal A1c, goal fasting, and goal 2 hour post prandial glucose - Reviewed dietary modifications including limiting portion size of carbohydrates and sweets (ice cream).  - Recommend to continue metformin 1000 mg BID. Encouraged patient to take as prescribed. Discussed this is very low risk of hypoglycemia and he agreed to take as prescribed.  - He still declines taking Jardiance. Prefers to work on diet and see his A1c at next PCP visit. If he does  start on SGLT-2, would need to submit patient assistance application.  - Recommend to check fasting blood glucose once daily - A1c due at next PCP visit     Hyperlipidemia/ASCVD Risk Reduction: - Currently uncontrolled based on LDL 97 mg/dl, above goal <02 mg/dl and TG elevated at 542 mg/dl. Switched to rosuvastatin at last telephone visit and he is tolerating well. - Reviewed long term complications of uncontrolled cholesterol - Reviewed lifestyle recommendations including limiting intake of processed foods and meats. - Recommend to continue rosuvastatin 20 mg daily  - F/u lipid panel due at next PCP visit   Follow Up Plan: PCP on 03/06/2024 and PharmD on 03/21/2024  Jarrett Ables, PharmD PGY-1 Pharmacy Resident   Kieth Brightly, PharmD, BCACP, CPP Clinical Pharmacist, [facility]

## 2024-02-18 ENCOUNTER — Encounter: Payer: Self-pay | Admitting: Emergency Medicine

## 2024-02-18 ENCOUNTER — Ambulatory Visit: Payer: Medicare Other | Admitting: Emergency Medicine

## 2024-02-18 VITALS — BP 157/82 | HR 82 | Temp 97.8°F | Ht 74.0 in | Wt 226.4 lb

## 2024-02-18 DIAGNOSIS — J479 Bronchiectasis, uncomplicated: Secondary | ICD-10-CM | POA: Diagnosis not present

## 2024-02-18 DIAGNOSIS — Z720 Tobacco use: Secondary | ICD-10-CM

## 2024-02-18 DIAGNOSIS — J449 Chronic obstructive pulmonary disease, unspecified: Secondary | ICD-10-CM

## 2024-02-18 NOTE — Assessment & Plan Note (Signed)
 Former tobacco.  Due for his lung cancer screening CT in late March

## 2024-02-18 NOTE — Assessment & Plan Note (Signed)
 He is doing well on prednisone 3 mg daily, tolerated the change.  The last time I tried to take him lower than that he flared and had significant increase in symptoms.  Plan to continue current regimen.  Please continue Symbicort 2 puffs twice a day you have been taking it.  Rinse and gargle after using. Keep albuterol available to use 2 puffs up to every 4 hours if needed for shortness of breath, chest tightness, wheezing.  Continue prednisone 3 mg once daily. Continue Daliresp as you have been taking it Follow-up with APP in 6 months Follow Dr. Delton Coombes in 12 months, sooner if you have any problems.

## 2024-02-18 NOTE — Assessment & Plan Note (Addendum)
 Overall stable.  Cough burden is low.  He does not have to do any flutter valve or chest vest.  We can follow his bronchiectasis when he has his lung cancer screening CT

## 2024-02-18 NOTE — Patient Instructions (Signed)
 Please continue Symbicort 2 puffs twice a day you have been taking it.  Rinse and gargle after using. Keep albuterol available to use 2 puffs up to every 4 hours if needed for shortness of breath, chest tightness, wheezing.  Continue prednisone 3 mg once daily. Continue Daliresp as you have been taking it Get your lung cancer screening CT scan at the end of March as planned. Follow-up with APP in 6 months Follow Dr. Delton Coombes in 12 months, sooner if you have any problems.

## 2024-02-18 NOTE — Progress Notes (Signed)
 HPI:  ROV 02/18/2024 --Mr. Leverette is 74.  I follow him for COPD/asthma, bronchiectasis with remote Mycobacterium avium, chronic cough and chronic rhinitis.  He is prednisone dependent and at his last visit we tried decreasing to 3 mg daily.  Also on Daliresp, Symbicort. 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 He does some farm work. He uses albuterol about  few times a week. No extra pred or abx.   He participates in lung cancer screening program next scan to be done in late March    EXAM:  Vitals:   02/18/24 1051  BP: (!) 157/82  Pulse: 82  Temp: 97.8 F (36.6 C)  TempSrc: Oral  SpO2: 97%  Weight: 226 lb 6.4 oz (102.7 kg)  Height: 6\' 2"  (1.88 m)    Gen: Pleasant, obese, in no distress,  normal affect, stutters  ENT: No lesions,  mouth clear,  oropharynx clear, no postnasal drip  Neck: No JVD, no Stridor  Lungs: Mostly clear.  No wheezing.  Soft end-exp wheeze  Cardiovascular: RRR, heart sounds normal, no murmur or gallops, no peripheral edema  Musculoskeletal: No deformities, no cyanosis or clubbing  Neuro: alert, non focal  Skin: Warm, no lesions or rashes   COPD (chronic obstructive pulmonary disease) (HCC) He is doing well on prednisone 3 mg daily, tolerated the change.  The last time I tried to take him lower than that he flared and had significant increase in symptoms.  Plan to continue current regimen.  Please continue Symbicort 2 puffs twice a day you have been taking it.  Rinse and gargle after using. Keep albuterol available to use 2 puffs up to every 4 hours if needed for shortness of breath, chest tightness, wheezing.  Continue prednisone 3 mg once daily. Continue Daliresp as you have been taking it Follow-up with APP in 6 months Follow Dr. Delton Coombes in 12 months, sooner if you have any problems.  Bronchiectasis without complication (HCC) Overall stable.  Cough burden is low.  He does not have to do any flutter valve or  chest vest.  We can follow his bronchiectasis when he has his lung cancer screening CT  Tobacco use Former tobacco.  Due for his lung cancer screening CT in late March      Levy Pupa, MD, PhD 02/18/2024, 12:13 PM [facility] [phone] or if no answer [phone]

## 2024-02-24 ENCOUNTER — Ambulatory Visit: Admitting: Nurse Practitioner

## 2024-02-24 ENCOUNTER — Encounter: Payer: Self-pay | Admitting: Nurse Practitioner

## 2024-02-24 ENCOUNTER — Ambulatory Visit: Payer: Self-pay | Admitting: Family Medicine

## 2024-02-24 VITALS — BP 144/73 | HR 100 | Temp 97.4°F | Ht 74.0 in | Wt 223.0 lb

## 2024-02-24 DIAGNOSIS — M109 Gout, unspecified: Secondary | ICD-10-CM | POA: Diagnosis not present

## 2024-02-24 DIAGNOSIS — M79672 Pain in left foot: Secondary | ICD-10-CM | POA: Diagnosis not present

## 2024-02-24 MED ORDER — NAPROXEN 500 MG PO TBEC: 500.0000 mg | DELAYED_RELEASE_TABLET | Freq: Two times a day (BID) | ORAL | 0 refills | Status: DC

## 2024-02-24 NOTE — Progress Notes (Signed)
 Acute Office Visit  Subjective:     Patient ID: Craig Arnold, male    DOB: 1956/05/19, 68 y.o.   MRN: [id number]  Chief Complaint  Patient presents with   Foot Pain    Left foot pain for 3-4 days, had redness last night some swelling, foot burns    HPI Craig Arnold 68 year old male present February 24, 2024 for an acute visit concern for left foot pain PMH history of DM2, COPD, GERD, Left foot pain new problems: Craig Arnold presents  with acute pain, redness, and swelling in the left ankle, knee], which began last night 02/23/2024. The pain is described as severe and sharp in nature, and is worsened by touch or movement. The joint is notably erythematous and warm to the touch. The patient reports no previous history of joint issues or trauma to the area. Denies  history of elevated uric acid levels or recent alcohol consumption or purine-rich foods, dehydration. The patient denies any fever, rash, or other systemic symptoms.  Active Ambulatory Problems    Diagnosis Date Noted   Hypertension associated with diabetes (HCC) 10/08/2007   Bronchiectasis without complication (HCC) 12/18/2007   COPD (chronic obstructive pulmonary disease) (HCC) 10/08/2007   Pulmonary nodules 12/09/2007   GERD 10/08/2007   Allergic rhinitis due to pollen 02/20/2011   Type 2 diabetes mellitus with other specified complication (HCC) 07/13/2017   Obesity (BMI 30.0-34.9) 06/20/2018   Fatigue 03/09/2023   Tobacco use 02/18/2024   Acute gout of left ankle 02/24/2024   Left foot pain 02/24/2024   Resolved Ambulatory Problems    Diagnosis Date Noted   Allergic rhinitis 12/09/2007   Intrinsic asthma 10/08/2007   SYNCOPE 09/19/2008   Cough 12/09/2007   Disease due to mycobacteria 01/18/2008   Acute bronchitis 02/25/2016   Anemia 06/05/2020   Past Medical History:  Diagnosis Date   Allergic rhinitis    Asthma    Atypical mycobacterial infection    Bronchiectasis    Diabetes mellitus without complication (HCC)     GERD (gastroesophageal reflux disease)    HTN (hypertension)    Pulmonary nodule     Review of Systems  Constitutional:  Negative for chills and fever.  HENT:  Negative for ear pain and sore throat.   Respiratory:  Negative for cough, sputum production and wheezing.   Cardiovascular:  Negative for chest pain and leg swelling.  Gastrointestinal:  Negative for constipation, diarrhea, nausea and vomiting.  Musculoskeletal:        Left ankle pain  Skin:  Negative for itching and rash.  Neurological:  Negative for dizziness and headaches.   Negative unless indicated in HPI    Objective:    BP (!) 144/73   Pulse 100   Temp (!) 97.4 F (36.3 C) (Temporal)   Ht 6\' 2"  (1.88 m)   Wt 223 lb (101.2 kg)   SpO2 98%   BMI 28.63 kg/m    Physical Exam Vitals and nursing note reviewed.  Constitutional:      Appearance: Normal appearance.  HENT:     Head: Normocephalic and atraumatic.     Nose: Nose normal.     Mouth/Throat:     Mouth: Mucous membranes are moist.  Eyes:     General: No scleral icterus.    Extraocular Movements: Extraocular movements intact.     Conjunctiva/sclera: Conjunctivae normal.     Pupils: Pupils are equal, round, and reactive to light.  Cardiovascular:     Heart sounds: Normal heart sounds.  Pulmonary:     Effort: Pulmonary effort is normal.     Breath sounds: Normal breath sounds.  Musculoskeletal:       Legs:     Comments: Left ankle erythema and warmth  Skin:    General: Skin is warm and dry.     Findings: No rash.  Neurological:     Mental Status: He is alert and oriented to person, place, and time.  Psychiatric:        Mood and Affect: Mood normal.        Behavior: Behavior normal.        Thought Content: Thought content normal.        Judgment: Judgment normal.     No results found for any visits on 02/24/24.      Assessment & Plan:  Left foot pain -     Uric acid  Acute gout of left ankle, unspecified cause -     Uric  acid  Other orders -     Naproxen; Take 1 tablet (500 mg total) by mouth 2 (two) times daily with a meal.  Dispense: 30 tablet; Refill: 0  Craig Arnold is 68 yrs old Caucasian male seen today for left ankle pain, no acute distress Possible gout will order uric acid and prescribed Naproxen BID Labs: uric acid , results pending  Encourage healthy lifestyle choices, including diet (rich in fruits, vegetables, and lean proteins, and low in salt and simple carbohydrates) and exercise (at least 30 minutes of moderate physical activity daily).     The above assessment and management plan was discussed with the patient. The patient verbalized understanding of and has agreed to the management plan. Patient is aware to call the clinic if they develop any new symptoms or if symptoms persist or worsen. Patient is aware when to return to the clinic for a follow-up visit. Patient educated on when it is appropriate to go to the emergency department.   Return if symptoms worsen or fail to improve.  Craig Arnold, [facility] [address] [place], [place] [address] [phone]  Note: This document was prepared by [employer] voice dictation technology and any errors that results from this process are unintentional.

## 2024-02-24 NOTE — Telephone Encounter (Signed)
 Copied from CRM [id number]. Topic: Clinical - Red Word Triage >> Feb 24, 2024  8:14 AM Patsy Lager T wrote: Red Word that prompted transfer to Nurse Triage: patient called stated for the last 3-4 days he has not been able to sleep or walk from extreme pain in his left foot. He has a burning around his ankle.  Chief Complaint: left foot pain, swelling, red, painful Symptoms: see above Frequency: started 3-4 days ago Pertinent Negatives: Patient denies sob, numbness, weakness Disposition: [] ED /[] Urgent Care (no appt availability in office) / [x] Appointment(In office/virtual)/ []  [facility]/ [] Home Care/ [] Refused Recommended Disposition /[]  Mobile Bus/ []  Follow-up with PCP Additional Notes: per protocol apt made for today; care advice given, denies questions, instructed to go to the er if becomes worse.   Reason for Disposition  [1] Looks infected (spreading redness, pus) AND [2] large red area (> 2 in. or 5 cm)  Answer Assessment - Initial Assessment Questions 1. ONSET: "When did the pain start?"      3-4 days ago 2. LOCATION: "Where is the pain located?"      Left foot 3. PAIN: "How bad is the pain?"    (Scale 1-10; or mild, moderate, severe)  - MILD (1-3): doesn't interfere with normal activities.   - MODERATE (4-7): interferes with normal activities (e.g., work or school) or awakens from sleep, limping.   - SEVERE (8-10): excruciating pain, unable to do any normal activities, unable to walk.      10/10 4. WORK OR EXERCISE: "Has there been any recent work or exercise that involved this part of the body?"      denies 5. CAUSE: "What do you think is causing the foot pain?"     denies 6. OTHER SYMPTOMS: "Do you have any other symptoms?" (e.g., leg pain, rash, fever, numbness)     denies 7. PREGNANCY: "Is there any chance you are pregnant?" "When was your last menstrual period?"     na  Protocols used: Foot Pain-A-AH

## 2024-02-25 ENCOUNTER — Other Ambulatory Visit: Payer: Self-pay | Admitting: Family Medicine

## 2024-02-25 LAB — URIC ACID: Uric Acid: 4.8 mg/dL (ref 3.8–8.4)

## 2024-02-28 ENCOUNTER — Telehealth: Payer: Self-pay | Admitting: Family Medicine

## 2024-02-28 NOTE — Telephone Encounter (Signed)
 Copied from CRM [id number]. Topic: Clinical - Lab/Test Results >> Feb 28, 2024  2:45 PM Everette C wrote: Reason for CRM: The patient would like to know why their Uric acid was checked on 02/24/24

## 2024-03-02 ENCOUNTER — Telehealth: Payer: Self-pay | Admitting: Emergency Medicine

## 2024-03-02 MED ORDER — ALBUTEROL SULFATE HFA 108 (90 BASE) MCG/ACT IN AERS: 2.0000 | INHALATION_SPRAY | RESPIRATORY_TRACT | 2 refills | Status: DC | PRN

## 2024-03-02 NOTE — Telephone Encounter (Signed)
 Spoke with [place]. Albuterol inhaler has been sent to pharmacy. Pt verbalized understanding. Nothing further needed.

## 2024-03-02 NOTE — Telephone Encounter (Signed)
 Patient states needs refill for Albuterol inhaler. Pharmacy is [facility] [place]. Patient phone number is [phone].

## 2024-03-06 ENCOUNTER — Ambulatory Visit (INDEPENDENT_AMBULATORY_CARE_PROVIDER_SITE_OTHER): Payer: Medicare Other | Admitting: Family Medicine

## 2024-03-06 ENCOUNTER — Encounter: Payer: Self-pay | Admitting: Family Medicine

## 2024-03-06 VITALS — BP 144/74 | HR 85 | Ht 74.0 in | Wt 242.0 lb

## 2024-03-06 DIAGNOSIS — Z7984 Long term (current) use of oral hypoglycemic drugs: Secondary | ICD-10-CM | POA: Diagnosis not present

## 2024-03-06 DIAGNOSIS — E1169 Type 2 diabetes mellitus with other specified complication: Secondary | ICD-10-CM

## 2024-03-06 DIAGNOSIS — E1159 Type 2 diabetes mellitus with other circulatory complications: Secondary | ICD-10-CM

## 2024-03-06 DIAGNOSIS — J449 Chronic obstructive pulmonary disease, unspecified: Secondary | ICD-10-CM

## 2024-03-06 DIAGNOSIS — I152 Hypertension secondary to endocrine disorders: Secondary | ICD-10-CM | POA: Diagnosis not present

## 2024-03-06 LAB — BAYER DCA HB A1C WAIVED: HB A1C (BAYER DCA - WAIVED): 5.6 % (ref 4.8–5.6)

## 2024-03-06 NOTE — Progress Notes (Signed)
 BP (!) 144/74   Pulse 85   Ht 6\' 2"  (1.88 m)   Wt 242 lb (109.8 kg)   SpO2 99%   BMI 31.07 kg/m    Subjective:   Patient ID: Craig Arnold, male    DOB: 1956-09-26, 68 y.o.   MRN: [id number]  HPI: Craig Arnold is a 68 y.o. male presenting on 03/06/2024 for Medical Management of Chronic Issues and Diabetes   HPI Hypertension Patient is currently on olmesartan, and their blood pressure today is 144/74. Patient denies any lightheadedness or dizziness. Patient denies headaches, blurred vision, chest pains, shortness of breath, or weakness. Denies any side effects from medication and is content with current medication.   Type 2 diabetes mellitus Patient comes in today for recheck of his diabetes. Patient has been currently taking metformin and [place]. Patient is currently on an ACE inhibitor/ARB. Patient has not seen an ophthalmologist this year. Patient denies any new issues with their feet. The symptom started onset as an adult hypertension ARE RELATED TO DM   COPD, sees pulmonology Patient is coming in for COPD recheck today.  He is currently on albuterol and Symbicort and Daliresp.  He has a mild chronic cough but denies any major coughing spells or wheezing spells.  He has 1 nighttime symptoms per week and 2 daytime symptoms per week currently.  He says he is currently got a little bit of wheezing with a cold but nothing that is bothering him significantly.  He says he will start using his inhalers a little more often over the next week to help clear it out.  Relevant past medical, surgical, family and social history reviewed and updated as indicated. Interim medical history since our last visit reviewed. Allergies and medications reviewed and updated.  Review of Systems  Constitutional:  Negative for chills and fever.  Eyes:  Negative for visual disturbance.  Respiratory:  Positive for wheezing. Negative for cough and shortness of breath.   Cardiovascular:  Negative for chest pain  and leg swelling.  Musculoskeletal:  Negative for back pain and gait problem.  Skin:  Negative for rash.  Neurological:  Negative for dizziness and light-headedness.  All other systems reviewed and are negative.   Per HPI unless specifically indicated above   Allergies as of 03/06/2024   No Known Allergies      Medication List        Accurate as of March 06, 2024 10:53 AM. If you have any questions, ask your nurse or doctor.          STOP taking these medications    empagliflozin 25 MG Tabs tablet Commonly known as: Jardiance Stopped by: Elige Radon Joniyah Mallinger       TAKE these medications    Accu-Chek Guide test strip Generic drug: glucose blood Use as instructed   Accu-Chek Guide w/Device Kit Check BS 3 times a day Dx E11.69   Accu-Chek Softclix Lancet Dev Kit CHECK BLOOD SUGARS 3 TIMES A DAY Dx E11.69   Accu-Chek Softclix Lancets lancets CHECK BLOOD SUGAR 3 TIMES DAILY Dx E11.69   albuterol 108 (90 Base) MCG/ACT inhaler Commonly known as: VENTOLIN HFA Inhale 2 puffs into the lungs every 4 (four) hours as needed.   budesonide-formoterol 160-4.5 MCG/ACT inhaler Commonly known as: Symbicort INHALE 2 PUFFS INTO THE LUNGS TWICE A DAY   dapagliflozin propanediol 10 MG Tabs tablet Commonly known as: Farxiga Take 1 tablet (10 mg total) by mouth daily before breakfast.   fluticasone 50 MCG/ACT nasal  spray Commonly known as: FLONASE Place 1 spray into both nostrils 2 (two) times daily as needed for allergies or rhinitis.   loratadine 10 MG tablet Commonly known as: CLARITIN Take 10 mg by mouth daily.  [employer] brand   metFORMIN 1000 MG tablet Commonly known as: GLUCOPHAGE Take 1 tablet (1,000 mg total) by mouth 2 (two) times daily with a meal.   MULTIVITAMIN ADULTS 50+ PO Take by mouth.   naproxen 500 MG EC tablet Commonly known as: EC NAPROSYN Take 1 tablet (500 mg total) by mouth 2 (two) times daily with a meal.   olmesartan 20 MG  tablet Commonly known as: BENICAR Take 1 tablet (20 mg total) by mouth daily.   omeprazole 20 MG capsule Commonly known as: PRILOSEC Take 1 capsule (20 mg total) by mouth daily.   predniSONE 1 MG tablet Commonly known as: DELTASONE TAKE 3 TABLETS (3 MG TOTAL) BY MOUTH DAILY WITH BREAKFAST.   roflumilast 500 MCG Tabs tablet Commonly known as: DALIRESP TAKE 1 TABLET BY MOUTH EVERY DAY   rosuvastatin 20 MG tablet Commonly known as: Crestor Take 1 tablet (20 mg total) by mouth daily.         Objective:   BP (!) 144/74   Pulse 85   Ht 6\' 2"  (1.88 m)   Wt 242 lb (109.8 kg)   SpO2 99%   BMI 31.07 kg/m   Wt Readings from Last 3 Encounters:  03/06/24 242 lb (109.8 kg)  02/24/24 223 lb (101.2 kg)  02/18/24 226 lb 6.4 oz (102.7 kg)    Physical Exam Vitals and nursing note reviewed.  Constitutional:      General: He is not in acute distress.    Appearance: He is well-developed. He is not diaphoretic.  Eyes:     General: No scleral icterus.    Conjunctiva/sclera: Conjunctivae normal.  Neck:     Thyroid: No thyromegaly.  Cardiovascular:     Rate and Rhythm: Normal rate and regular rhythm.     Heart sounds: Normal heart sounds. No murmur heard. Pulmonary:     Effort: Pulmonary effort is normal. No respiratory distress.     Breath sounds: Wheezing present. No rhonchi or rales.  Chest:     Chest wall: No tenderness.  Musculoskeletal:        General: Normal range of motion.     Cervical back: Neck supple.  Lymphadenopathy:     Cervical: No cervical adenopathy.  Skin:    General: Skin is warm and dry.     Findings: No rash.  Neurological:     Mental Status: He is alert and oriented to person, place, and time.     Coordination: Coordination normal.  Psychiatric:        Behavior: Behavior normal.       Assessment & Plan:   Problem List Items Addressed This Visit       Cardiovascular and Mediastinum   Hypertension associated with diabetes (HCC)      Respiratory   COPD (chronic obstructive pulmonary disease) (HCC)     Endocrine   Type 2 diabetes mellitus with other specified complication (HCC) - Primary   Relevant Orders   Bayer DCA Hb A1c Waived    A1c is 5.6, looks good, blood pressure mildly elevated but will monitor for now.  He did have a little wheezing today but he denies any major symptoms, recommend these use albuterol inhaler a little more frequently.  Has not been using it very often  right now. Follow up plan: Return in about 3 months (around 06/06/2024), or if symptoms worsen or fail to improve, for Diabetes and hypertension and COPD.  Counseling provided for all of the vaccine components Orders Placed This Encounter  Procedures   Bayer DCA Hb A1c Waived    Arville Care, MD [facility] 03/06/2024, 10:53 AM

## 2024-03-16 ENCOUNTER — Other Ambulatory Visit: Payer: Self-pay | Admitting: Emergency Medicine

## 2024-03-16 NOTE — Telephone Encounter (Signed)
 Copied from CRM [id number]. Topic: Clinical - Medication Refill >> Mar 16, 2024  8:53 AM Isabell A wrote: Most Recent Primary Care Visit:   Medication: roflumilast (DALIRESP) 500 MCG TABS tablet  Has the patient contacted their pharmacy? Yes (Agent: If no, request that the patient contact the pharmacy for the refill. If patient does not wish to contact the pharmacy document the reason why and proceed with request.) (Agent: If yes, when and what did the pharmacy advise?)  Is this the correct pharmacy for this prescription? Yes If no, delete pharmacy and type the correct one.  This is the patient's preferred pharmacy:  [id number] [place] [place] - [address] [place] [address] Phone: [phone] Fax: [phone]   Has the prescription been filled recently? Yes  Is the patient out of the medication? No, 3 left.   Has the patient been seen for an appointment in the last year OR does the patient have an upcoming appointment? Yes  Can we respond through MyChart? No  Agent: Please be advised that Rx refills may take up to 3 business days. We ask that you follow-up with your pharmacy.

## 2024-03-17 ENCOUNTER — Ambulatory Visit (HOSPITAL_COMMUNITY)
Admission: RE | Admit: 2024-03-17 | Discharge: 2024-03-17 | Disposition: A | Discharge status: Home / Self Care | Payer: Medicare Other | Source: Ambulatory Visit | Attending: Acute Care | Admitting: Acute Care

## 2024-03-17 DIAGNOSIS — I700 Atherosclerosis of aorta: Secondary | ICD-10-CM | POA: Diagnosis not present

## 2024-03-17 DIAGNOSIS — Z122 Encounter for screening for malignant neoplasm of respiratory organs: Secondary | ICD-10-CM | POA: Diagnosis not present

## 2024-03-17 DIAGNOSIS — I2510 Atherosclerotic heart disease of native coronary artery without angina pectoris: Secondary | ICD-10-CM | POA: Diagnosis not present

## 2024-03-17 DIAGNOSIS — J439 Emphysema, unspecified: Secondary | ICD-10-CM | POA: Insufficient documentation

## 2024-03-17 DIAGNOSIS — Z87891 Personal history of nicotine dependence: Secondary | ICD-10-CM | POA: Insufficient documentation

## 2024-03-17 MED ORDER — ROFLUMILAST 500 MCG PO TABS: 500.0000 ug | ORAL_TABLET | Freq: Every day | ORAL | 3 refills | Status: AC

## 2024-03-21 ENCOUNTER — Other Ambulatory Visit: Payer: Medicare Other

## 2024-03-21 NOTE — Progress Notes (Signed)
 Marland Kitchen

## 2024-03-23 ENCOUNTER — Other Ambulatory Visit: Payer: Self-pay | Admitting: Adult Health

## 2024-04-18 ENCOUNTER — Other Ambulatory Visit: Payer: Self-pay | Admitting: Emergency Medicine

## 2024-04-25 ENCOUNTER — Telehealth: Payer: Self-pay

## 2024-04-25 NOTE — Telephone Encounter (Signed)
 Attempted to call pt , # no working

## 2024-04-25 NOTE — Telephone Encounter (Signed)
-----   Message from Lucio Sabin Dettinger sent at 04/21/2024 11:36 AM EDT ----- Patient's lung cancer screening looks good.  They said based on what they are seeing and how long he quit smoking 15 years ago that he does not need to have anymore lung cancer screenings ----- Message ----- From: Deann Exon, RN Sent: 04/21/2024  11:15 AM EDT To: Lucio Sabin Dettinger, MD  Last LDCT due to quit smoking x 15 years

## 2024-05-19 ENCOUNTER — Ambulatory Visit: Admitting: Family Medicine

## 2024-05-19 ENCOUNTER — Encounter: Payer: Self-pay | Admitting: Family Medicine

## 2024-05-19 VITALS — BP 126/72 | HR 85 | Ht 74.0 in | Wt 215.0 lb

## 2024-05-19 DIAGNOSIS — R634 Abnormal weight loss: Secondary | ICD-10-CM

## 2024-05-19 DIAGNOSIS — F410 Panic disorder [episodic paroxysmal anxiety] without agoraphobia: Secondary | ICD-10-CM

## 2024-05-19 DIAGNOSIS — I2510 Atherosclerotic heart disease of native coronary artery without angina pectoris: Secondary | ICD-10-CM

## 2024-05-19 MED ORDER — BUSPIRONE HCL 7.5 MG PO TABS: 7.5000 mg | ORAL_TABLET | Freq: Two times a day (BID) | ORAL | 2 refills | Status: DC

## 2024-05-19 NOTE — Progress Notes (Signed)
 BP 126/72   Pulse 85   Ht 6\' 2"  (1.88 m)   Wt 215 lb (97.5 kg)   SpO2 96%   BMI 27.60 kg/m    Subjective:   Patient ID: Craig Arnold, male    DOB: September 28, 1956, 68 y.o.   MRN: [id number]  HPI: Craig Arnold is a 68 y.o. male presenting on 05/19/2024 for Medical Management of Chronic Issues (Review CT Results)   HPI Patient is coming in for follow-up on CT scan results.  Patient had a lung screening CT that showed LAD calcification and the concern was about blockages.  The pulmonology recommended he come see us  and discussed the results and get set up with cardiology.  He denies any chest pain or shortness of breath.  Abnormal weight loss Patient has been fluctuating up and down his weight over the past year but he is down at least 7 or 8 pounds from his previous baseline earlier in the year.  He denies any difficulties with appetite or eating or bowels.  Patient says he gets occasional panic attacks especially when he is up and hikes and doing certain things and he does not want that to be something that causes him to have a panic attack and get hurt somewhere.  Relevant past medical, surgical, family and social history reviewed and updated as indicated. Interim medical history since our last visit reviewed. Allergies and medications reviewed and updated.  Review of Systems  Constitutional:  Positive for unexpected weight change. Negative for appetite change, chills and fever.  Eyes:  Negative for visual disturbance.  Respiratory:  Negative for shortness of breath and wheezing.   Cardiovascular:  Negative for chest pain and leg swelling.  Skin:  Negative for rash.  Neurological:  Negative for dizziness and light-headedness.  Psychiatric/Behavioral:  Negative for decreased concentration, dysphoric mood, self-injury, sleep disturbance and suicidal ideas. The patient is nervous/anxious.   All other systems reviewed and are negative.   Per HPI unless specifically indicated  above   Allergies as of 05/19/2024   No Known Allergies      Medication List        Accurate as of May 19, 2024 10:41 AM. If you have any questions, ask your nurse or doctor.          Accu-Chek Guide test strip Generic drug: glucose blood Use as instructed   Accu-Chek Guide w/Device Kit Check BS 3 times a day Dx E11.69   Accu-Chek Softclix Lancet Dev Kit CHECK BLOOD SUGARS 3 TIMES A DAY Dx E11.69   Accu-Chek Softclix Lancets lancets CHECK BLOOD SUGAR 3 TIMES DAILY Dx E11.69   albuterol  108 (90 Base) MCG/ACT inhaler Commonly known as: VENTOLIN  HFA Inhale 2 puffs into the lungs every 4 (four) hours as needed.   budesonide -formoterol  160-4.5 MCG/ACT inhaler Commonly known as: Symbicort  INHALE 2 PUFFS INTO THE LUNGS TWICE A DAY   busPIRone 7.5 MG tablet Commonly known as: BUSPAR Take 1 tablet (7.5 mg total) by mouth 2 (two) times daily. Started by: Lucio Sabin Christabell Loseke   dapagliflozin  propanediol 10 MG Tabs tablet Commonly known as: Farxiga  Take 1 tablet (10 mg total) by mouth daily before breakfast.   fluticasone  50 MCG/ACT nasal spray Commonly known as: FLONASE  Place 1 spray into both nostrils 2 (two) times daily as needed for allergies or rhinitis.   loratadine 10 MG tablet Commonly known as: CLARITIN Take 10 mg by mouth daily.  [employer] brand   metFORMIN  1000 MG tablet Commonly known as:  GLUCOPHAGE  Take 1 tablet (1,000 mg total) by mouth 2 (two) times daily with a meal.   MULTIVITAMIN ADULTS 50+ PO Take by mouth.   naproxen  500 MG EC tablet Commonly known as: EC NAPROSYN  Take 1 tablet (500 mg total) by mouth 2 (two) times daily with a meal.   olmesartan  20 MG tablet Commonly known as: BENICAR  Take 1 tablet (20 mg total) by mouth daily.   omeprazole  20 MG capsule Commonly known as: PRILOSEC Take 1 capsule (20 mg total) by mouth daily.   predniSONE  1 MG tablet Commonly known as: DELTASONE  TAKE 3 TABLETS (3 MG TOTAL) BY MOUTH DAILY WITH  BREAKFAST.   roflumilast  500 MCG Tabs tablet Commonly known as: DALIRESP  Take 1 tablet (500 mcg total) by mouth daily.   rosuvastatin  20 MG tablet Commonly known as: Crestor  Take 1 tablet (20 mg total) by mouth daily.         Objective:   BP 126/72   Pulse 85   Ht 6\' 2"  (1.88 m)   Wt 215 lb (97.5 kg)   SpO2 96%   BMI 27.60 kg/m   Wt Readings from Last 3 Encounters:  05/19/24 215 lb (97.5 kg)  03/06/24 242 lb (109.8 kg)  02/24/24 223 lb (101.2 kg)    Physical Exam Vitals and nursing note reviewed.  Constitutional:      General: He is not in acute distress.    Appearance: He is well-developed. He is not diaphoretic.  Eyes:     General: No scleral icterus.    Conjunctiva/sclera: Conjunctivae normal.  Neck:     Thyroid: No thyromegaly.  Cardiovascular:     Rate and Rhythm: Normal rate and regular rhythm.     Heart sounds: Normal heart sounds. No murmur heard. Pulmonary:     Effort: Pulmonary effort is normal. No respiratory distress.     Breath sounds: Wheezing (End expiratory wheezes in upper lobes) present.  Musculoskeletal:        General: Normal range of motion.     Cervical back: Neck supple.  Lymphadenopathy:     Cervical: No cervical adenopathy.  Skin:    General: Skin is warm and dry.     Findings: No rash.  Neurological:     Mental Status: He is alert and oriented to person, place, and time.     Coordination: Coordination normal.  Psychiatric:        Mood and Affect: Mood is anxious.        Behavior: Behavior normal.       Assessment & Plan:   Problem List Items Addressed This Visit   None Visit Diagnoses       Abnormal weight loss    -  Primary   Relevant Orders   Thyroid Panel With TSH     Coronary artery calcification seen on CAT scan       Relevant Orders   Ambulatory referral to Cardiology     Panic attacks       Relevant Medications   busPIRone (BUSPAR) 7.5 MG tablet     Will start buspirone today can take twice a day to help  with anxiety.  Refer to cardiology for cardiac screening based on LAD calcification on CAT scan results.  Will check thyroid panel today to make sure it is not a cause behind the weight loss.  Follow up plan: Return if symptoms worsen or fail to improve.  Counseling provided for all of the vaccine components Orders Placed This Encounter  Procedures   Thyroid Panel With TSH   Ambulatory referral to Cardiology    Jolyne Needs, MD [facility] 05/19/2024, 10:41 AM

## 2024-05-20 LAB — THYROID PANEL WITH TSH
Free Thyroxine Index: 2.4 (ref 1.2–4.9)
T3 Uptake Ratio: 28 % (ref 24–39)
T4, Total: 8.6 ug/dL (ref 4.5–12.0)
TSH: 1.29 u[IU]/mL (ref 0.450–4.500)

## 2024-05-26 ENCOUNTER — Ambulatory Visit: Payer: Self-pay | Admitting: Family Medicine

## 2024-05-26 ENCOUNTER — Telehealth: Payer: Self-pay

## 2024-05-26 NOTE — Telephone Encounter (Signed)
 Closing call everything addressed

## 2024-05-26 NOTE — Telephone Encounter (Signed)
 Copied from CRM [id number]. Topic: Clinical - Lab/Test Results >> May 26, 2024 11:12 AM Rennis Case wrote: Reason for CRM:  Relayed results to patient, no further questions.

## 2024-06-07 ENCOUNTER — Telehealth: Payer: Self-pay

## 2024-06-07 ENCOUNTER — Encounter: Payer: Self-pay | Admitting: Family Medicine

## 2024-06-07 ENCOUNTER — Ambulatory Visit: Admitting: Family Medicine

## 2024-06-07 VITALS — BP 130/72 | HR 77 | Ht 74.0 in | Wt 212.0 lb

## 2024-06-07 DIAGNOSIS — I152 Hypertension secondary to endocrine disorders: Secondary | ICD-10-CM | POA: Diagnosis not present

## 2024-06-07 DIAGNOSIS — E1169 Type 2 diabetes mellitus with other specified complication: Secondary | ICD-10-CM

## 2024-06-07 DIAGNOSIS — E1159 Type 2 diabetes mellitus with other circulatory complications: Secondary | ICD-10-CM | POA: Diagnosis not present

## 2024-06-07 DIAGNOSIS — K219 Gastro-esophageal reflux disease without esophagitis: Secondary | ICD-10-CM | POA: Diagnosis not present

## 2024-06-07 DIAGNOSIS — J449 Chronic obstructive pulmonary disease, unspecified: Secondary | ICD-10-CM

## 2024-06-07 DIAGNOSIS — R634 Abnormal weight loss: Secondary | ICD-10-CM

## 2024-06-07 DIAGNOSIS — Z125 Encounter for screening for malignant neoplasm of prostate: Secondary | ICD-10-CM | POA: Diagnosis not present

## 2024-06-07 DIAGNOSIS — M25511 Pain in right shoulder: Secondary | ICD-10-CM

## 2024-06-07 DIAGNOSIS — M25512 Pain in left shoulder: Secondary | ICD-10-CM | POA: Diagnosis not present

## 2024-06-07 LAB — CMP14+EGFR
ALT: 12 IU/L (ref 0–44)
AST: 13 IU/L (ref 0–40)
Albumin: 4.3 g/dL (ref 3.9–4.9)
Alkaline Phosphatase: 76 IU/L (ref 44–121)
BUN/Creatinine Ratio: 15 (ref 10–24)
BUN: 12 mg/dL (ref 8–27)
Bilirubin Total: 0.6 mg/dL (ref 0.0–1.2)
CO2: 22 mmol/L (ref 20–29)
Calcium: 9.3 mg/dL (ref 8.6–10.2)
Chloride: 102 mmol/L (ref 96–106)
Creatinine, Ser: 0.8 mg/dL (ref 0.76–1.27)
Globulin, Total: 2.7 g/dL (ref 1.5–4.5)
Glucose: 146 mg/dL — ABNORMAL HIGH (ref 70–99)
Potassium: 3.9 mmol/L (ref 3.5–5.2)
Sodium: 139 mmol/L (ref 134–144)
Total Protein: 7 g/dL (ref 6.0–8.5)
eGFR: 97 mL/min/{1.73_m2} (ref >59)

## 2024-06-07 LAB — CBC WITH DIFFERENTIAL/PLATELET
Basophils Absolute: 0 10*3/uL (ref 0.0–0.2)
Basos: 0 %
EOS (ABSOLUTE): 0.3 10*3/uL (ref 0.0–0.4)
Eos: 5 %
Hematocrit: 37.5 % (ref 37.5–51.0)
Hemoglobin: 12.8 g/dL — ABNORMAL LOW (ref 13.0–17.7)
Immature Grans (Abs): 0 10*3/uL (ref 0.0–0.1)
Immature Granulocytes: 0 %
Lymphocytes Absolute: 1.6 10*3/uL (ref 0.7–3.1)
Lymphs: 23 %
MCH: 30.7 pg (ref 26.6–33.0)
MCHC: 34.1 g/dL (ref 31.5–35.7)
MCV: 90 fL (ref 79–97)
Monocytes Absolute: 1.3 10*3/uL — ABNORMAL HIGH (ref 0.1–0.9)
Monocytes: 19 %
Neutrophils Absolute: 3.7 10*3/uL (ref 1.4–7.0)
Neutrophils: 53 %
Platelets: 209 10*3/uL (ref 150–450)
RBC: 4.17 x10E6/uL (ref 4.14–5.80)
RDW: 13.6 % (ref 11.6–15.4)
WBC: 6.9 10*3/uL (ref 3.4–10.8)

## 2024-06-07 LAB — BAYER DCA HB A1C WAIVED: HB A1C (BAYER DCA - WAIVED): 6.7 % — ABNORMAL HIGH (ref 4.8–5.6)

## 2024-06-07 LAB — LIPID PANEL
Chol/HDL Ratio: 2.4 ratio (ref 0.0–5.0)
Cholesterol, Total: 90 mg/dL — ABNORMAL LOW (ref 100–199)
HDL: 37 mg/dL — ABNORMAL LOW (ref >39)
LDL Chol Calc (NIH): 26 mg/dL (ref 0–99)
Triglycerides: 161 mg/dL — ABNORMAL HIGH (ref 0–149)
VLDL Cholesterol Cal: 27 mg/dL (ref 5–40)

## 2024-06-07 MED ORDER — DICLOFENAC SODIUM 75 MG PO TBEC: 75.0000 mg | DELAYED_RELEASE_TABLET | Freq: Two times a day (BID) | ORAL | 2 refills | Status: DC

## 2024-06-07 NOTE — Progress Notes (Signed)
 BP 130/72   Pulse 77   Ht 6' 2 (1.88 m)   Wt 212 lb (96.2 kg)   SpO2 94%   BMI 27.22 kg/m    Subjective:   Patient ID: Craig Arnold, male    DOB: November 22, 1956, 68 y.o.   MRN: [id number]  HPI: Craig Arnold is a 68 y.o. male presenting on 06/07/2024 for Medical Management of Chronic Issues, Diabetes, Weight Loss, and Insomnia   HPI Hypertension Patient is currently on olmesartan , and their blood pressure today is 130/72. Patient denies any lightheadedness or dizziness. Patient denies headaches, blurred vision, chest pains, shortness of breath, or weakness. Denies any side effects from medication and is content with current medication.   Type 2 diabetes mellitus Patient comes in today for recheck of his diabetes. Patient has been currently taking metformin . Patient is currently on an ACE inhibitor/ARB. Patient has not seen an ophthalmologist this year. Patient denies any new issues with their feet. The symptom started onset as an adult hypertension and GERD ARE RELATED TO DM   GERD Patient is currently on omeprazole .  She denies any major symptoms or abdominal pain or belching or burping. She denies any blood in her stool or lightheadedness or dizziness.   COPD Patient is coming in for COPD recheck today.  He is currently on Daliresp  and Symbicort  and albuterol .  He has a mild chronic cough but denies any major coughing spells or wheezing spells.  He has 0 nighttime symptoms per week and 0 daytime symptoms per week currently.   Shoulder pain Patient has been having shoulder pain that hurts both on top of his shoulders and the lateral aspect, hurts when he does certain movements especially reaching over his head or reaching backwards.  He says he does take some Advil for it and it does help but it still bothering him.  He said PT for it in the past and that helped a lot 2.  He says it is really just popped up like this over the past few weeks and just does not seem to be getting  better.  Relevant past medical, surgical, family and social history reviewed and updated as indicated. Interim medical history since our last visit reviewed. Allergies and medications reviewed and updated.  Review of Systems  Constitutional:  Negative for chills and fever.  Respiratory:  Negative for shortness of breath and wheezing.   Cardiovascular:  Negative for chest pain and leg swelling.  Musculoskeletal:  Positive for arthralgias. Negative for back pain, gait problem, myalgias and neck pain.  Skin:  Negative for rash.  All other systems reviewed and are negative.   Per HPI unless specifically indicated above   Allergies as of 06/07/2024   No Known Allergies      Medication List        Accurate as of June 07, 2024 11:38 AM. If you have any questions, ask your nurse or doctor.          STOP taking these medications    naproxen  500 MG EC tablet Commonly known as: EC NAPROSYN  Stopped by: Lucio Sabin Dakhari Zuver       TAKE these medications    Accu-Chek Guide test strip Generic drug: glucose blood Use as instructed   Accu-Chek Guide w/Device Kit Check BS 3 times a day Dx E11.69   Accu-Chek Softclix Lancet Dev Kit CHECK BLOOD SUGARS 3 TIMES A DAY Dx E11.69   Accu-Chek Softclix Lancets lancets CHECK BLOOD SUGAR 3 TIMES DAILY Dx E11.69  albuterol  108 (90 Base) MCG/ACT inhaler Commonly known as: VENTOLIN  HFA Inhale 2 puffs into the lungs every 4 (four) hours as needed.   budesonide -formoterol  160-4.5 MCG/ACT inhaler Commonly known as: Symbicort  INHALE 2 PUFFS INTO THE LUNGS TWICE A DAY   busPIRone  7.5 MG tablet Commonly known as: BUSPAR  Take 1 tablet (7.5 mg total) by mouth 2 (two) times daily.   dapagliflozin  propanediol 10 MG Tabs tablet Commonly known as: Farxiga  Take 1 tablet (10 mg total) by mouth daily before breakfast.   diclofenac 75 MG EC tablet Commonly known as: VOLTAREN Take 1 tablet (75 mg total) by mouth 2 (two) times daily. Started  by: Lucio Sabin Demarquis Osley   fluticasone  50 MCG/ACT nasal spray Commonly known as: FLONASE  Place 1 spray into both nostrils 2 (two) times daily as needed for allergies or rhinitis.   loratadine 10 MG tablet Commonly known as: CLARITIN Take 10 mg by mouth daily.  [employer] brand   metFORMIN  1000 MG tablet Commonly known as: GLUCOPHAGE  Take 1 tablet (1,000 mg total) by mouth 2 (two) times daily with a meal.   MULTIVITAMIN ADULTS 50+ PO Take by mouth.   olmesartan  20 MG tablet Commonly known as: BENICAR  Take 1 tablet (20 mg total) by mouth daily.   omeprazole  20 MG capsule Commonly known as: PRILOSEC Take 1 capsule (20 mg total) by mouth daily.   predniSONE  1 MG tablet Commonly known as: DELTASONE  TAKE 3 TABLETS (3 MG TOTAL) BY MOUTH DAILY WITH BREAKFAST.   roflumilast  500 MCG Tabs tablet Commonly known as: DALIRESP  Take 1 tablet (500 mcg total) by mouth daily.   rosuvastatin  20 MG tablet Commonly known as: Crestor  Take 1 tablet (20 mg total) by mouth daily.         Objective:   BP 130/72   Pulse 77   Ht 6' 2 (1.88 m)   Wt 212 lb (96.2 kg)   SpO2 94%   BMI 27.22 kg/m   Wt Readings from Last 3 Encounters:  06/07/24 212 lb (96.2 kg)  05/19/24 215 lb (97.5 kg)  03/06/24 242 lb (109.8 kg)    Physical Exam Vitals and nursing note reviewed.  Constitutional:      General: He is not in acute distress.    Appearance: He is well-developed. He is not diaphoretic.   Eyes:     General: No scleral icterus.    Conjunctiva/sclera: Conjunctivae normal.   Neck:     Thyroid : No thyromegaly.   Cardiovascular:     Rate and Rhythm: Normal rate and regular rhythm.     Heart sounds: Normal heart sounds. No murmur heard. Pulmonary:     Effort: Pulmonary effort is normal. No respiratory distress.     Breath sounds: Normal breath sounds. No wheezing.   Musculoskeletal:        General: Normal range of motion.     Right shoulder: Tenderness, bony tenderness and  crepitus present.     Left shoulder: Tenderness, bony tenderness and crepitus present.     Cervical back: Neck supple.     Comments: Tenderness overlying AC joint but also pain underneath his axilla with range of motion.  Lymphadenopathy:     Cervical: No cervical adenopathy.   Skin:    General: Skin is warm and dry.     Findings: No rash.   Neurological:     Mental Status: He is alert and oriented to person, place, and time.     Coordination: Coordination normal.   Psychiatric:  Behavior: Behavior normal.       Assessment & Plan:   Problem List Items Addressed This Visit       Cardiovascular and Mediastinum   Hypertension associated with diabetes (HCC) - Primary   Relevant Orders   Bayer DCA Hb A1c Waived   CBC with Differential/Platelet   CMP14+EGFR   Lipid panel     Respiratory   COPD (chronic obstructive pulmonary disease) (HCC)     Digestive   GERD     Endocrine   Type 2 diabetes mellitus with other specified complication (HCC)   Other Visit Diagnoses       Prostate cancer screening       Relevant Orders   PR PSA SCREENING     Unintentional weight loss         Acute pain of both shoulders       Relevant Medications   diclofenac (VOLTAREN) 75 MG EC tablet   Other Relevant Orders   Ambulatory referral to Physical Therapy       New shoulder pain, likely AC joint arthritis flareup, will give him Voltaren and then also will send him to physical therapy.  We have done a lot of testing and scans and imaging and do not really have an answer for his unintentional weight loss, encouraged increased protein intake and will monitor.  A1 C was 6.6 and looks good. Follow up plan: Return in about 3 months (around 09/07/2024), or if symptoms worsen or fail to improve, for Diabetes recheck.  Counseling provided for all of the vaccine components Orders Placed This Encounter  Procedures   Bayer DCA Hb A1c Waived   CBC with Differential/Platelet    CMP14+EGFR   Lipid panel   Ambulatory referral to Physical Therapy   PR PSA SCREENING    Jolyne Needs, MD [facility] 06/07/2024, 11:38 AM

## 2024-06-07 NOTE — Telephone Encounter (Signed)
 Pt states at today's appt that he has not received a phone call to schedule an appt with cardiology. Referral placed 05/19/24 for valve calcifications seen on CAT scan.

## 2024-06-07 NOTE — Telephone Encounter (Signed)
 Referral has been sent to [facility] in [place] at this time.  MyChart message sent to Patient with Specialty Office contact information.

## 2024-06-07 NOTE — Telephone Encounter (Signed)
 Pt made aware. He will have his daughter check his mychart. If does not hear from them in a few days he will call them. Also confirmed with pt that the only script sent today was 75mg  Voltaren tabs. Pt had no further questions.

## 2024-06-07 NOTE — Patient Instructions (Addendum)
 Please call [facility] to schedule your colonoscopy. Their contact number is [phone].  We have sent a message to our referrals department about the cardiology referral and will call you with their response.

## 2024-06-09 ENCOUNTER — Ambulatory Visit: Payer: Self-pay | Admitting: Family Medicine

## 2024-07-15 ENCOUNTER — Other Ambulatory Visit: Payer: Self-pay | Admitting: Emergency Medicine

## 2024-07-17 ENCOUNTER — Other Ambulatory Visit: Payer: Self-pay | Admitting: Emergency Medicine

## 2024-07-17 MED ORDER — PREDNISONE 1 MG PO TABS: 3.0000 mg | ORAL_TABLET | Freq: Every day | ORAL | 2 refills | Status: DC

## 2024-07-17 NOTE — Telephone Encounter (Signed)
 Copied from CRM [id number]. Topic: Clinical - Medication Refill >> Jul 17, 2024  9:50 AM Corean SAUNDERS wrote: Medication: predniSONE  (DELTASONE ) 1 MG tablet  Has the patient contacted their pharmacy? Yes, pharmacy has tried to send refill requests with no success.  (Agent: If no, request that the patient contact the pharmacy for the refill. If patient does not wish to contact the pharmacy document the reason why and proceed with request.) (Agent: If yes, when and what did the pharmacy advise?)  This is the patient's preferred pharmacy:  [id number] - [place], [place] - [address] [place] [address] Phone: [phone] Fax: [phone]  Is this the correct pharmacy for this prescription? Yes If no, delete pharmacy and type the correct one.   Has the prescription been filled recently? Yes  Is the patient out of the medication? No  Has the patient been seen for an appointment in the last year OR does the patient have an upcoming appointment? Yes  Can we respond through MyChart? No  Agent: Please be advised that Rx refills may take up to 3 business days. We ask that you follow-up with your pharmacy.

## 2024-07-31 ENCOUNTER — Other Ambulatory Visit: Payer: Self-pay | Admitting: Family Medicine

## 2024-07-31 DIAGNOSIS — F410 Panic disorder [episodic paroxysmal anxiety] without agoraphobia: Secondary | ICD-10-CM

## 2024-08-15 ENCOUNTER — Other Ambulatory Visit: Payer: Self-pay | Admitting: Family Medicine

## 2024-08-15 DIAGNOSIS — K219 Gastro-esophageal reflux disease without esophagitis: Secondary | ICD-10-CM

## 2024-08-20 ENCOUNTER — Other Ambulatory Visit: Payer: Self-pay | Admitting: Family Medicine

## 2024-09-11 ENCOUNTER — Encounter: Payer: Self-pay | Admitting: Family Medicine

## 2024-09-11 ENCOUNTER — Ambulatory Visit: Admitting: Family Medicine

## 2024-09-11 ENCOUNTER — Encounter: Payer: Self-pay | Admitting: Cardiology

## 2024-09-11 VITALS — BP 138/79 | HR 86 | Temp 97.0°F | Ht 74.0 in | Wt 235.0 lb

## 2024-09-11 DIAGNOSIS — I152 Hypertension secondary to endocrine disorders: Secondary | ICD-10-CM | POA: Diagnosis not present

## 2024-09-11 DIAGNOSIS — E119 Type 2 diabetes mellitus without complications: Secondary | ICD-10-CM | POA: Diagnosis not present

## 2024-09-11 DIAGNOSIS — E1159 Type 2 diabetes mellitus with other circulatory complications: Secondary | ICD-10-CM | POA: Diagnosis not present

## 2024-09-11 DIAGNOSIS — R931 Abnormal findings on diagnostic imaging of heart and coronary circulation: Secondary | ICD-10-CM | POA: Insufficient documentation

## 2024-09-11 DIAGNOSIS — E1169 Type 2 diabetes mellitus with other specified complication: Secondary | ICD-10-CM

## 2024-09-11 DIAGNOSIS — J449 Chronic obstructive pulmonary disease, unspecified: Secondary | ICD-10-CM

## 2024-09-11 DIAGNOSIS — Z7984 Long term (current) use of oral hypoglycemic drugs: Secondary | ICD-10-CM

## 2024-09-11 DIAGNOSIS — J441 Chronic obstructive pulmonary disease with (acute) exacerbation: Secondary | ICD-10-CM

## 2024-09-11 LAB — BAYER DCA HB A1C WAIVED: HB A1C (BAYER DCA - WAIVED): 7 % — ABNORMAL HIGH (ref 4.8–5.6)

## 2024-09-11 MED ORDER — AIRSUPRA 90-80 MCG/ACT IN AERO: 1.0000 | INHALATION_SPRAY | Freq: Three times a day (TID) | RESPIRATORY_TRACT | Status: DC

## 2024-09-11 NOTE — Progress Notes (Signed)
 BP 138/79   Pulse 86   Temp (!) 97 F (36.1 C)   Ht 6' 2 (1.88 m)   Wt 235 lb (106.6 kg)   SpO2 97%   BMI 30.17 kg/m    Subjective:   Patient ID: Craig Arnold, male    DOB: 09-26-1956, 68 y.o.   MRN: [id number]  HPI: Craig Arnold is a 68 y.o. male presenting on 09/11/2024 for 3 MONTH FOLLOW UP, Medical Management of Chronic Issues, Diabetes, and Hypertension   Discussed the use of AI scribe software for clinical note transcription with the patient, who gave verbal consent to proceed.  History of Present Illness   Craig Arnold is a 68 year old male with diabetes and COPD who presents for a recheck of his blood sugar and respiratory symptoms.  His blood sugar was 172 mg/dL this morning, which he checked himself. He has not been consistently monitoring his blood sugar levels recently. He continues to take metformin  and Farxiga  for his diabetes. He mentions dietary changes, including increased bread consumption and occasional milk intake, which he believes may affect his blood sugar levels.  He has been on prednisone  3 mg daily for the past eight years. He also takes medication for blood pressure and cholesterol, specifically rosuvastatin . He expresses some concern about information he read regarding statins but continues to take them.  He experiences wheezing and coughing, which he attributes to a cold. He has not used his Symbicort  inhaler for three to five months due to cost but continues to use albuterol  as needed.  He mentions a past experience with a treadmill test and dye injection due to his COPD, which was conducted in [place].          Relevant past medical, surgical, family and social history reviewed and updated as indicated. Interim medical history since our last visit reviewed. Allergies and medications reviewed and updated.  Review of Systems  Constitutional:  Negative for chills and fever.  Eyes:  Negative for discharge.  Respiratory:  Negative for shortness  of breath and wheezing.   Cardiovascular:  Negative for chest pain and leg swelling.  Musculoskeletal:  Negative for back pain and gait problem.  Skin:  Negative for rash.  All other systems reviewed and are negative.   Per HPI unless specifically indicated above   Allergies as of 09/11/2024   No Known Allergies      Medication List        Accurate as of September 11, 2024 10:16 AM. If you have any questions, ask your nurse or doctor.          Accu-Chek Guide Test test strip Generic drug: glucose blood CHECK BLOOD SUGAR 3 TIMES DAILY Dx E11.69   Accu-Chek Guide w/Device Kit Check BS 3 times a day Dx E11.69   Accu-Chek Softclix Lancet Dev Kit CHECK BLOOD SUGARS 3 TIMES A DAY Dx E11.69   Accu-Chek Softclix Lancets lancets CHECK BLOOD SUGAR 3 TIMES DAILY Dx E11.69   albuterol  108 (90 Base) MCG/ACT inhaler Commonly known as: VENTOLIN  HFA INHALE 2 PUFFS INTO THE LUNGS EVERY 4 HOURS AS NEEDED   budesonide -formoterol  160-4.5 MCG/ACT inhaler Commonly known as: Symbicort  INHALE 2 PUFFS INTO THE LUNGS TWICE A DAY   busPIRone  7.5 MG tablet Commonly known as: BUSPAR  TAKE 1 TABLET BY MOUTH 2 TIMES DAILY.   dapagliflozin  propanediol 10 MG Tabs tablet Commonly known as: Farxiga  Take 1 tablet (10 mg total) by mouth daily before breakfast.   diclofenac  75 MG EC  tablet Commonly known as: VOLTAREN  Take 1 tablet (75 mg total) by mouth 2 (two) times daily.   fluticasone  50 MCG/ACT nasal spray Commonly known as: FLONASE  Place 1 spray into both nostrils 2 (two) times daily as needed for allergies or rhinitis.   loratadine 10 MG tablet Commonly known as: CLARITIN Take 10 mg by mouth daily.  [employer] brand   metFORMIN  1000 MG tablet Commonly known as: GLUCOPHAGE  Take 1 tablet (1,000 mg total) by mouth 2 (two) times daily with a meal.   MULTIVITAMIN ADULTS 50+ PO Take by mouth.   olmesartan  20 MG tablet Commonly known as: BENICAR  Take 1 tablet (20 mg total) by  mouth daily.   omeprazole  20 MG capsule Commonly known as: PRILOSEC TAKE 1 CAPSULE BY MOUTH EVERY DAY   predniSONE  1 MG tablet Commonly known as: DELTASONE  TAKE 3 TABLETS (3 MG TOTAL) BY MOUTH DAILY WITH BREAKFAST.   predniSONE  1 MG tablet Commonly known as: DELTASONE  Take 3 tablets (3 mg total) by mouth daily with breakfast.   roflumilast  500 MCG Tabs tablet Commonly known as: DALIRESP  Take 1 tablet (500 mcg total) by mouth daily.   rosuvastatin  20 MG tablet Commonly known as: Crestor  Take 1 tablet (20 mg total) by mouth daily.         Objective:   BP 138/79   Pulse 86   Temp (!) 97 F (36.1 C)   Ht 6' 2 (1.88 m)   Wt 235 lb (106.6 kg)   SpO2 97%   BMI 30.17 kg/m   Wt Readings from Last 3 Encounters:  09/11/24 235 lb (106.6 kg)  06/07/24 212 lb (96.2 kg)  05/19/24 215 lb (97.5 kg)    Physical Exam Physical Exam   VITALS: BP- 138/79 NECK: Thyroid  normal. CHEST: Wheezing present. CARDIOVASCULAR: Heart sounds regular.         Assessment & Plan:   Problem List Items Addressed This Visit       Cardiovascular and Mediastinum   Hypertension associated with diabetes (HCC)     Respiratory   COPD (chronic obstructive pulmonary disease) (HCC)     Endocrine   Type 2 diabetes mellitus with other specified complication (HCC) - Primary   Relevant Orders   Bayer DCA Hb A1c Waived   Microalbumin / creatinine urine ratio   Other Visit Diagnoses       COPD with acute exacerbation (HCC)              Chronic obstructive pulmonary disease (COPD) Wheezing and coughing likely exacerbated by cold. Non-compliance with Symbicort  due to cost. - Use Airsupra  3 times daily for 2 weeks, sample given. - Continue albuterol  inhaler as needed. - Advise to contact if symptoms worsen.  Type 2 diabetes mellitus Blood glucose 172 mg/dL. Dietary habits may contribute to elevated glucose. - Continue metformin  and Farxiga . - Advise to monitor carbohydrate intake,  particularly bread and milk. - Encourage regular blood glucose monitoring.  Hypertension Blood pressure 138/79 mmHg, controlled with current medication. - Continue current antihypertensive medication.  Hyperlipidemia Taking rosuvastatin , concerns about online information addressed. Benefits of statins discussed. - Continue rosuvastatin . - Reassure about the benefits of statins in preventing cardiovascular events.  Cardiology follow-up Scheduled for further evaluation, potential for exercise treadmill test or cardiac scan. - Attend cardiology appointment for potential exercise treadmill test or cardiac scan.          Follow up plan: Return in about 3 months (around 12/11/2024), or if symptoms worsen or fail to  improve, for Diabetes recheck.  Counseling provided for all of the vaccine components Orders Placed This Encounter  Procedures   Bayer DCA Hb A1c Waived   Microalbumin / creatinine urine ratio    Fonda Levins, MD [facility] 09/11/2024, 10:16 AM

## 2024-09-11 NOTE — Addendum Note (Signed)
 Addended by: LEIGH ROSINA SAILOR on: 09/11/2024 10:59 AM   Modules accepted: Orders

## 2024-09-11 NOTE — Progress Notes (Unsigned)
 Cardiology Office Note   Date:  09/13/2024   ID:  Decorian, Schuenemann 29-Dec-1955, MRN [id number]  PCP:  Dettinger, Fonda LABOR, MD  Cardiologist:   Lynwood Schilling, MD Referring:  Dettinger, Fonda LABOR, MD  Chief Complaint  Patient presents with   Coronary Artery Disease   Shortness of Breath      History of Present Illness: Craig Arnold is a 68 y.o. male who presents for evaluation of elevated coronary calcium .  The patient reports having had a stress test many years ago.  He otherwise has not had any cardiac issues.  He does have a very strong family history of early coronary disease.  He has been noted to have coronary calcium  on screening lung CTs.  He does have dyspnea with exertion but is always blamed this on his COPD.  He gets short of breath walking a mile distance on level ground.  He is not describing PND or orthopnea.  He is not describing chest pressure, neck or arm discomfort.  He does not report any palpitations, presyncope or syncope.  He had no weight gain or edema.   Past Medical History:  Diagnosis Date   Asthma    Atypical mycobacterial infection    Bronchiectasis    COPD (chronic obstructive pulmonary disease) (HCC)    Diabetes mellitus without complication (HCC)    GERD (gastroesophageal reflux disease)    HTN (hypertension)    Pulmonary nodule     Past Surgical History:  Procedure Laterality Date   BASAL CELL CARCINOMA EXCISION     face     Current Outpatient Medications  Medication Sig Dispense Refill   albuterol  (VENTOLIN  HFA) 108 (90 Base) MCG/ACT inhaler INHALE 2 PUFFS INTO THE LUNGS EVERY 4 HOURS AS NEEDED 6.7 each 2   Albuterol -Budesonide  (AIRSUPRA ) 90-80 MCG/ACT AERO Inhale 1 Inhalation into the lungs every 8 (eight) hours.     busPIRone  (BUSPAR ) 7.5 MG tablet TAKE 1 TABLET BY MOUTH TWICE A DAY 180 tablet 1   dapagliflozin  propanediol (FARXIGA ) 10 MG TABS tablet Take 1 tablet (10 mg total) by mouth daily before breakfast. 30 tablet 5    diclofenac  (VOLTAREN ) 75 MG EC tablet TAKE 1 TABLET BY MOUTH TWICE A DAY 60 tablet 2   fluticasone  (FLONASE ) 50 MCG/ACT nasal spray Place 1 spray into both nostrils 2 (two) times daily as needed for allergies or rhinitis. 16 g 6   loratadine (CLARITIN) 10 MG tablet Take 10 mg by mouth daily.  [employer] brand     metFORMIN  (GLUCOPHAGE ) 1000 MG tablet Take 1 tablet (1,000 mg total) by mouth 2 (two) times daily with a meal. 180 tablet 3   Multiple Vitamins-Minerals (MULTIVITAMIN ADULTS 50+ PO) Take by mouth.     olmesartan  (BENICAR ) 20 MG tablet Take 1 tablet (20 mg total) by mouth daily. 90 tablet 3   omeprazole  (PRILOSEC) 20 MG capsule TAKE 1 CAPSULE BY MOUTH EVERY DAY 90 capsule 0   predniSONE  (DELTASONE ) 1 MG tablet TAKE 3 TABLETS (3 MG TOTAL) BY MOUTH DAILY WITH BREAKFAST. 90 tablet 2   roflumilast  (DALIRESP ) 500 MCG TABS tablet Take 1 tablet (500 mcg total) by mouth daily. 90 tablet 3   rosuvastatin  (CRESTOR ) 20 MG tablet Take 1 tablet (20 mg total) by mouth daily. 90 tablet 3   Accu-Chek Softclix Lancets lancets CHECK BLOOD SUGAR 3 TIMES DAILY Dx E11.69 300 each 3   Blood Glucose Monitoring Suppl (ACCU-CHEK GUIDE) w/Device KIT Check BS 3 times a day Dx  E11.69 1 kit 0   budesonide -formoterol  (SYMBICORT ) 160-4.5 MCG/ACT inhaler INHALE 2 PUFFS INTO THE LUNGS TWICE A DAY (Patient not taking: Reported on 09/13/2024) 10.2 each 5   glucose blood (ACCU-CHEK GUIDE TEST) test strip CHECK BLOOD SUGAR 3 TIMES DAILY Dx E11.69 300 strip 3   Lancets Misc. (ACCU-CHEK SOFTCLIX LANCET DEV) KIT CHECK BLOOD SUGARS 3 TIMES A DAY Dx E11.69 1 kit 0   predniSONE  (DELTASONE ) 1 MG tablet Take 3 tablets (3 mg total) by mouth daily with breakfast. 90 tablet 2   No current facility-administered medications for this visit.    Allergies:   Patient has no known allergies.    Social History:  The patient  reports that he quit smoking about 16 years ago. His smoking use included cigarettes. He started smoking about 56  years ago. He has a 40 pack-year smoking history. He has never used smokeless tobacco. He reports that he does not drink alcohol and does not use drugs.   Family History:  The patient's family history includes COPD in his father; Diabetes in his brother, brother, and mother; Emphysema in his father; Heart attack (age of onset: 51) in his brother; Heart attack (age of onset: 29) in his father; Heart disease in his father, maternal grandfather, and paternal uncle; Rheum arthritis in his mother; Stroke in his father.    ROS:  Please see the history of present illness.   Otherwise, review of systems are positive for none.   All other systems are reviewed and negative.    PHYSICAL EXAM: VS:  BP 138/80   Pulse 85   Ht 6' 2 (1.88 m)   Wt 234 lb (106.1 kg)   BMI 30.04 kg/m  , BMI Body mass index is 30.04 kg/m. GENERAL:  Well appearing HEENT:  Pupils equal round and reactive, fundi not visualized, oral mucosa unremarkable NECK:  No jugular venous distention, waveform within normal limits, carotid upstroke brisk and symmetric, no bruits, no thyromegaly LYMPHATICS:  No cervical, inguinal adenopathy LUNGS: Diffuse scattered wheezing BACK:  No CVA tenderness CHEST:  Unremarkable HEART:  PMI not displaced or sustained,S1 and S2 within normal limits, no S3, no S4, no clicks, no rubs, no murmurs ABD:  Flat, positive bowel sounds normal in frequency in pitch, no bruits, no rebound, no guarding, no midline pulsatile mass, no hepatomegaly, no splenomegaly EXT:  2 plus pulses throughout, trace edema, no cyanosis no clubbing SKIN:  No rashes no nodules NEURO:  Cranial nerves II through XII grossly intact, motor grossly intact throughout PSYCH:  Cognitively intact, oriented to person place and time    EKG:  EKG Interpretation Date/Time:  Wednesday September 13 2024 13:46:48 EDT Ventricular Rate:  85 PR Interval:  164 QRS Duration:  94 QT Interval:  358 QTC Calculation: 426 R Axis:   61  Text  Interpretation: Normal sinus rhythm with sinus arrhythmia Normal ECG No previous ECGs available Confirmed by Lavona Agent ([id number]) on 09/13/2024 2:05:21 PM     Recent Labs: 05/19/2024: TSH 1.290 06/07/2024: ALT 12; BUN 12; Creatinine, Ser 0.80; Hemoglobin 12.8; Platelets 209; Potassium 3.9; Sodium 139    Lipid Panel    Component Value Date/Time   CHOL 90 (L) 06/07/2024 1059   TRIG 161 (H) 06/07/2024 1059   HDL 37 (L) 06/07/2024 1059   CHOLHDL 2.4 06/07/2024 1059   CHOLHDL 5.0 CALC 08/16/2007 0916   VLDL 46 (H) 08/16/2007 0916   LDLCALC 26 06/07/2024 1059   LDLDIRECT 103.7 08/16/2007 0916  Wt Readings from Last 3 Encounters:  09/13/24 234 lb (106.1 kg)  09/11/24 235 lb (106.6 kg)  06/07/24 212 lb (96.2 kg)      Other studies Reviewed: Additional studies/ records that were reviewed today include: Primary care office records, labs. Review of the above records demonstrates:  Please see elsewhere in the note.     ASSESSMENT AND PLAN:  Coronary artery calcium : The patient has coronary calcium , aortic atherosclerosis, strong family history and shortness of breath.  Pretest probability of obstructive coronary disease is at least moderate.  I would like to screen him with stress testing.  However, he says he would not be able to walk on a treadmill because of his shortness of breath.  He will have PET testing.  DM: A1c is 6.7.  He will continue the meds as listed.  HTN: His blood pressure is well-controlled.  Continue the meds as listed.  Dyslipidemia: LDL was 26 with an HDL of 37.  No change in therapy.   Current medicines are reviewed at length with the patient today.  The patient does not have concerns regarding medicines.  The following changes have been made:  no change  Labs/ tests ordered today include:   Orders Placed This Encounter  Procedures   NM PET CT CARDIAC PERFUSION MULTI W/ABSOLUTE BLOODFLOW   EKG 12-Lead     Disposition:   FU with me as needed  based on the results of the above.   Signed, Lynwood Schilling, MD  09/13/2024 2:37 PM    [facility]

## 2024-09-12 ENCOUNTER — Telehealth: Payer: Self-pay | Admitting: Family Medicine

## 2024-09-12 ENCOUNTER — Other Ambulatory Visit: Payer: Self-pay | Admitting: Family Medicine

## 2024-09-12 ENCOUNTER — Other Ambulatory Visit

## 2024-09-12 DIAGNOSIS — F410 Panic disorder [episodic paroxysmal anxiety] without agoraphobia: Secondary | ICD-10-CM

## 2024-09-12 DIAGNOSIS — M25511 Pain in right shoulder: Secondary | ICD-10-CM

## 2024-09-12 DIAGNOSIS — E1169 Type 2 diabetes mellitus with other specified complication: Secondary | ICD-10-CM | POA: Diagnosis not present

## 2024-09-12 NOTE — Telephone Encounter (Signed)
 Patient walked in and wants to know how long he needs to stay on the airsupra  inhaler the sample that was given to him

## 2024-09-13 ENCOUNTER — Encounter: Payer: Self-pay | Admitting: Cardiology

## 2024-09-13 ENCOUNTER — Ambulatory Visit (INDEPENDENT_AMBULATORY_CARE_PROVIDER_SITE_OTHER): Admitting: Cardiology

## 2024-09-13 VITALS — BP 138/80 | HR 85 | Ht 74.0 in | Wt 234.0 lb

## 2024-09-13 DIAGNOSIS — R0602 Shortness of breath: Secondary | ICD-10-CM

## 2024-09-13 DIAGNOSIS — I10 Essential (primary) hypertension: Secondary | ICD-10-CM | POA: Diagnosis not present

## 2024-09-13 DIAGNOSIS — E118 Type 2 diabetes mellitus with unspecified complications: Secondary | ICD-10-CM

## 2024-09-13 DIAGNOSIS — R931 Abnormal findings on diagnostic imaging of heart and coronary circulation: Secondary | ICD-10-CM

## 2024-09-13 LAB — MICROALBUMIN / CREATININE URINE RATIO
Creatinine, Urine: 107.7 mg/dL
Microalb/Creat Ratio: 13 mg/g{creat} (ref 0–29)
Microalbumin, Urine: 13.5 ug/mL

## 2024-09-13 NOTE — Telephone Encounter (Signed)
 Pt made aware and understood. He will call back if no better. Pt also stated that he dropped of a urine yesterday. It is for microalbumin. Informed pt that we will call him once it is resulted. He has no further concerns.

## 2024-09-13 NOTE — Telephone Encounter (Signed)
 I would say to use it for about a week until he is feeling better and then after that he can just save it and only use it as needed.

## 2024-09-13 NOTE — Patient Instructions (Addendum)
 Medication Instructions:  Your physician recommends that you continue on your current medications as directed. Please refer to the Current Medication list given to you today.  Labwork: none  Testing/Procedures: Cardiac PET-see instructions below  Follow-Up: Your physician recommends that you schedule a follow-up appointment in: as needed  Any Other Special Instructions Will Be Listed Below (If Applicable).  If you need a refill on your cardiac medications before your next appointment, please call your pharmacy.     Please report to Radiology at the [facility] Main Entrance 30 minutes early for your test.  [address] [place], [place] [address]                        How to Prepare for Your Cardiac PET/CT Stress Test:  Nothing to eat or drink, except water, 3 hours prior to arrival time.  NO caffeine/decaffeinated products, or chocolate 12 hours prior to arrival. (Please note decaffeinated beverages (teas/coffees) still contain caffeine).  If you have caffeine within 12 hours prior, the test will need to be rescheduled.  Medication instructions: Do not take erectile dysfunction medications for 72 hours prior to test (sildenafil, tadalafil) Do not take nitrates (isosorbide mononitrate, Ranexa) the day before or day of test Do not take tamsulosin the day before or morning of test Hold theophylline containing medications for 12 hours. Hold Dipyridamole 48 hours prior to the test.  Diabetic Preparation: If able to eat breakfast prior to 3 hour fasting, you may take all medications, including your insulin. Do not worry if you miss your breakfast dose of insulin - start at your next meal. If you do not eat prior to 3 hour fast-Hold all diabetes (oral and insulin) medications. Patients who wear a continuous glucose monitor MUST remove the device prior to scanning.  You may take your remaining medications with water.  NO perfume, cologne or lotion on chest or abdomen  area. FEMALES - Please avoid wearing dresses to this appointment.  Total time is 1 to 2 hours; you may want to bring reading material for the waiting time.  IF YOU THINK YOU MAY BE PREGNANT, OR ARE NURSING PLEASE INFORM THE TECHNOLOGIST.  In preparation for your appointment, medication and supplies will be purchased.  Appointment availability is limited, so if you need to cancel or reschedule, please call the Radiology Department Scheduler at [phone] 24 hours in advance to avoid a cancellation fee of $100.00  What to Expect When you Arrive:  Once you arrive and check in for your appointment, you will be taken to a preparation room within the Radiology Department.  A technologist or Nurse will obtain your medical history, verify that you are correctly prepped for the exam, and explain the procedure.  Afterwards, an IV will be started in your arm and electrodes will be placed on your skin for EKG monitoring during the stress portion of the exam. Then you will be escorted to the PET/CT scanner.  There, staff will get you positioned on the scanner and obtain a blood pressure and EKG.  During the exam, you will continue to be connected to the EKG and blood pressure machines.  A small, safe amount of a radioactive tracer will be injected in your IV to obtain a series of pictures of your heart along with an injection of a stress agent.    After your Exam:  It is recommended that you eat a meal and drink a caffeinated beverage to counter act  any effects of the stress agent.  Drink plenty of fluids for the remainder of the day and urinate frequently for the first couple of hours after the exam.  Your doctor will inform you of your test results within 7-10 business days.  For more information and frequently asked questions, please visit our website: [email or web address]  For questions about your test or how to prepare for your test, please call: Cardiac Imaging Nurse Navigators Office:  [phone]

## 2024-09-14 NOTE — Progress Notes (Signed)
 This encounter was created in error - please disregard.

## 2024-09-18 ENCOUNTER — Ambulatory Visit: Payer: Self-pay | Admitting: Family Medicine

## 2024-10-13 ENCOUNTER — Encounter (HOSPITAL_COMMUNITY): Payer: Self-pay

## 2024-10-16 ENCOUNTER — Telehealth (HOSPITAL_COMMUNITY): Payer: Self-pay | Admitting: *Deleted

## 2024-10-16 NOTE — Telephone Encounter (Signed)
 Returning daughter's call about the patient's upcoming cardiac imaging study; pt's duaghter  verbalizes understanding of appt date/time, parking situation and where to check in, pre-test NPO status and verified current allergies; name and call back number provided for further questions should they arise  Chantal Requena RN Navigator Cardiac Imaging [facility] [phone] office [phone] cell  Patient's daughter is aware that patient is to avoid caffeine 12 hours prior to his cardiac PET study.

## 2024-10-16 NOTE — Telephone Encounter (Signed)
 Attempted to call patient regarding upcoming cardiac PET appointment. Left message on voicemail with name and callback number  Larey Brick RN Navigator Cardiac Imaging [facility] [phone] Office [phone] Cell  Reminder to avoid caffeine prior to appt.

## 2024-10-17 ENCOUNTER — Encounter (HOSPITAL_COMMUNITY)
Admission: RE | Admit: 2024-10-17 | Discharge: 2024-10-17 | Disposition: A | Discharge status: Home / Self Care | Source: Ambulatory Visit | Attending: Cardiology | Admitting: Cardiology

## 2024-10-17 DIAGNOSIS — R0602 Shortness of breath: Secondary | ICD-10-CM | POA: Diagnosis not present

## 2024-10-17 LAB — NM PET CT CARDIAC PERFUSION MULTI W/ABSOLUTE BLOODFLOW
LV dias vol: 135 mL (ref 62–150)
LV sys vol: 50 mL (ref 4.2–5.8)
MBFR: 1.81
Nuc Rest EF: 58 %
Nuc Stress EF: 63 %
Rest MBF: 1.87 ml/g/min
Rest Nuclear Isotope Dose: 27.4 mCi
ST Depression (mm): 0 mm
Stress MBF: 3.38 ml/g/min
Stress Nuclear Isotope Dose: 27.5 mCi
TID: 1

## 2024-10-17 MED ORDER — REGADENOSON 0.4 MG/5ML IV SOLN
INTRAVENOUS | Status: AC
  Filled 2024-10-17: qty 5

## 2024-10-17 MED ORDER — REGADENOSON 0.4 MG/5ML IV SOLN
0.4000 mg | Freq: Once | INTRAVENOUS | Status: AC
  Administered 2024-10-17: 0.4 mg via INTRAVENOUS

## 2024-10-17 MED ORDER — RUBIDIUM RB82 GENERATOR (RUBYFILL)
27.4400 | PACK | Freq: Once | INTRAVENOUS | Status: AC
  Administered 2024-10-17: 27.44 via INTRAVENOUS

## 2024-10-17 MED ORDER — RUBIDIUM RB82 GENERATOR (RUBYFILL)
27.4600 | PACK | Freq: Once | INTRAVENOUS | Status: AC
  Administered 2024-10-17: 27.46 via INTRAVENOUS

## 2024-10-17 NOTE — Progress Notes (Signed)
 Pt. Tolerated lexi scan well.

## 2024-10-20 ENCOUNTER — Other Ambulatory Visit: Payer: Self-pay | Admitting: *Deleted

## 2024-10-22 ENCOUNTER — Ambulatory Visit: Payer: Self-pay | Admitting: Cardiology

## 2024-10-30 ENCOUNTER — Ambulatory Visit: Admitting: Physician Assistant

## 2024-10-31 ENCOUNTER — Ambulatory Visit: Attending: Physician Assistant | Admitting: Physician Assistant

## 2024-10-31 ENCOUNTER — Encounter: Payer: Self-pay | Admitting: Physician Assistant

## 2024-10-31 VITALS — BP 150/80 | HR 99 | Ht 73.0 in | Wt 239.0 lb

## 2024-10-31 DIAGNOSIS — Z01818 Encounter for other preprocedural examination: Secondary | ICD-10-CM | POA: Diagnosis not present

## 2024-10-31 DIAGNOSIS — E785 Hyperlipidemia, unspecified: Secondary | ICD-10-CM | POA: Diagnosis not present

## 2024-10-31 DIAGNOSIS — R931 Abnormal findings on diagnostic imaging of heart and coronary circulation: Secondary | ICD-10-CM

## 2024-10-31 DIAGNOSIS — I152 Hypertension secondary to endocrine disorders: Secondary | ICD-10-CM

## 2024-10-31 DIAGNOSIS — E1159 Type 2 diabetes mellitus with other circulatory complications: Secondary | ICD-10-CM

## 2024-10-31 DIAGNOSIS — R9439 Abnormal result of other cardiovascular function study: Secondary | ICD-10-CM

## 2024-10-31 DIAGNOSIS — E119 Type 2 diabetes mellitus without complications: Secondary | ICD-10-CM

## 2024-10-31 DIAGNOSIS — R0609 Other forms of dyspnea: Secondary | ICD-10-CM

## 2024-10-31 NOTE — Progress Notes (Unsigned)
 Cardiology Office Note   Date:  11/01/2024  ID:  Lyal, Husted [birth year], MRN [id number] PCP: Dettinger, Fonda LABOR, MD   [facility] Providers Cardiologist:  Lynwood Schilling, MD     History of Present Illness Craig Arnold is a 68 y.o. male with past medical history of COPD, bronchiectasis, history of atypical Mycobacterium infection, hypertension, hyperlipidemia and DM2.  Patient with referred to Dr. Schilling for evaluation of elevated calcium  score.  He reportedly had a stress test several years ago but never been diagnosed with cardiac issues in the past.  He has very strong family history of heart issue.  He gets short of breath walking a mile on flat ground.  Subsequent PET stress test obtained on 10/09/2024 showed EF 63%, medium defect of moderate reduction in uptake in the apical basal inferior location that is reversible, consistent with RCA ischemia.  No significant extracardiac finding.  Patient presents today for evaluation based on the recent abnormal stress test.  I have reviewed the stress test results with the patient.  He denies any chest pain, his main symptom is dyspnea on exertion.  He has a history of atypical Mycobacterium infection and bronchiectasis/COPD.  I still suspect majority of his dyspnea on exertion is related to underlying lung issue however underlying coronary artery disease may be contributing in part to the dyspnea on exertion.  I will obtain basic metabolic panel and a CBC.  The risk and benefit of the procedure has been explained to the patient who is agreeable to proceed.  Patient will need to hold Farxiga  for 3 days prior to the procedure and to hold metformin  for 24 hours before and 48 hours after the procedure.  I will plan to see the patient back in 4 weeks for reassessment  ROS:   Patient denies any chest pain, he has dyspnea on exertion for several years.  He has no lower extremity edema, orthopnea or PND  Studies Reviewed EKG  Interpretation Date/Time:  Tuesday October 31 2024 14:25:51 EST Ventricular Rate:  99 PR Interval:  164 QRS Duration:  90 QT Interval:  342 QTC Calculation: 438 R Axis:   12  Text Interpretation: Normal sinus rhythm No significant ST-T wave changes Confirmed by Janene Boer [phone]) on 10/31/2024 3:24:59 PM    Cardiac Studies & Procedures   ______________________________________________________________________________________________   STRESS TESTS  NM PET CT CARDIAC PERFUSION MULTI W/ABSOLUTE BLOODFLOW 10/17/2024  Narrative   Findings are consistent with RCA ischemia with relative decrease in stress flows despite isolated LAD calcification. The study is intermediate risk due to decreased stress flows and amount of myocardium affected.   LV perfusion is abnormal. There is evidence of ischemia. There is no evidence of infarction. Defect 1: There is a medium defect with moderate reduction in uptake present in the apical to basal inferior location(s) that is reversible. There is normal wall motion in the defect area. Consistent with ischemia.   Rest left ventricular function is normal. Rest EF: 58%. Stress left ventricular function is normal. Stress EF: 63%. End diastolic cavity size is mildly enlarged. End systolic cavity size is normal.   Myocardial blood flow was computed to be 1.69ml/g/min at rest and 3.84ml/g/min at stress. Global myocardial blood flow reserve was 1.81 and was mildly abnormal.   Coronary calcium  was present on the attenuation correction CT images. Moderate coronary calcifications were present. Coronary calcifications were present in the left anterior descending artery distribution(s).   Electronically Signed  By: Stanly Leavens M.D.  EXAM: OVER-READ  INTERPRETATION  CT CHEST  The following report is a limited chest CT over-read performed by radiologist Dr. Elsie Ko [facility] on 10/17/2024. This over-read does not include interpretation  of cardiac or coronary anatomy or pathology nor does it include evaluation of the PET data. The cardiac PET-CT interpretation by the cardiologist is attached.  COMPARISON:  Chest CT 03/17/2024  FINDINGS: Mediastinum/Nodes: No enlarged lymph nodes within the visualized mediastinum.Aortic and coronary artery atherosclerosis noted.  Lungs/Pleura: There is no pleural effusion. The visualized lungs appear clear.  Upper abdomen: No significant findings in the visualized upper abdomen.  Musculoskeletal/Chest wall: No chest wall mass or suspicious osseous findings within the visualized chest on axial imaging. Assessment of the thoracolumbar junction limited by motion artifact. Mild spondylosis. Mild bilateral gynecomastia.  IMPRESSION: No significant extracardiac findings within the visualized chest.   Electronically Signed By: Elsie Perone M.D. On: 10/17/2024 10:42            ______________________________________________________________________________________________      Risk Assessment/Calculations          Physical Exam VS:  BP (!) 150/80   Pulse 99   Ht 6' 1 (1.854 m)   Wt 239 lb (108.4 kg)   SpO2 98%   BMI 31.53 kg/m        Wt Readings from Last 3 Encounters:  10/31/24 239 lb (108.4 kg)  09/13/24 234 lb (106.1 kg)  09/11/24 235 lb (106.6 kg)    GEN: Well nourished, well developed in no acute distress NECK: No JVD; No carotid bruits CARDIAC: RRR, no murmurs, rubs, gallops RESPIRATORY:  Clear to auscultation without rales, wheezing or rhonchi  ABDOMEN: Soft, non-tender, non-distended EXTREMITIES:  No edema; No deformity   ASSESSMENT AND PLAN  Abnormal stress test: Recent PET stress test was medium defect of moderate reduction in uptake in the apical basal inferior location that is reversible, consistent with RCA ischemia.  Patient does not have chest pain but has significant dyspnea with exertion.  Some of his dyspnea may be related to a lung  issue, however suspicion is that underlying coronary artery disease may be contributing in part to his shortness of breath with exertion.  Risk and benefit of cardiac catheterization has been discussed with the patient who is agreeable to proceed.  Hypertension: Blood pressure is elevated today, however it is normally well-controlled.  I will hold off on adjusting blood pressure medication due to a single days elevated blood pressure  Hyperlipidemia: On rosuvastatin  20 mg daily.  DM2: Managed by primary care provider.  Hold Farxiga  for 3 days prior to the cath.  Hold metformin  for 24 hours before and 48 hours after the cath.    Informed Consent   Shared Decision Making/Informed Consent The risks [stroke (1 in 1000), death (1 in 1000), kidney failure [usually temporary] (1 in 500), bleeding (1 in 200), allergic reaction [possibly serious] (1 in 200)], benefits (diagnostic support and management of coronary artery disease) and alternatives of a cardiac catheterization were discussed in detail with Mr. Catanzaro and he is willing to proceed.     Dispo: Follow-up in 4 weeks for reassessment post cath  Signed, Emerson Barretto, PA

## 2024-10-31 NOTE — Patient Instructions (Addendum)
 Medication Instructions:  Your physician recommends that you continue on your current medications as directed. Please refer to the Current Medication list given to you today.  *If you need a refill on your cardiac medications before your next appointment, please call your pharmacy*  Lab Work: TODAY: BMET, CBC If you have labs (blood work) drawn today and your tests are completely normal, you will receive your results only by: MyChart Message (if you have MyChart) OR A paper copy in the mail If you have any lab test that is abnormal or we need to change your treatment, we will call you to review the results.  Testing/Procedures:   Follow-Up: At [facility], you and your health needs are our priority.  As part of our continuing mission to provide you with exceptional heart care, our providers are all part of one team.  This team includes your primary Cardiologist (physician) and Advanced Practice Providers or APPs (Physician Assistants and Nurse Practitioners) who all work together to provide you with the care you need, when you need it.  Your next appointment:   4 week(s)  Provider:   Scot Ford, PA-C        We recommend signing up for the patient portal called MyChart.  Sign up information is provided on this After Visit Summary.  MyChart is used to connect with patients for Virtual Visits (Telemedicine).  Patients are able to view lab/test results, encounter notes, upcoming appointments, etc.  Non-urgent messages can be sent to your provider as well.   To learn more about what you can do with MyChart, go to [email or web address].   Other Instructions  [facility] A DEPT OF [place] AT [address] A DEPT OF THE [place]. [facility] [address] [place] [address] Dept: [phone] Loc: [phone]  Craig Arnold  10/31/2024  You are scheduled for a Cardiac Catheterization on Tuesday, November 18 with Dr. Newman Lawrence.  1. Please arrive at the [facility] (Main Entrance A) at [facility]: [address] [place], [place] [address] at 5:30 AM (This time is 2 hour(s) before your procedure to ensure your preparation).   Free valet parking service is available. You will check in at ADMITTING. The support person will be asked to wait in the waiting room.  It is OK to have someone drop you off and come back when you are ready to be discharged.    Special note: Every effort is made to have your procedure done on time. Please understand that emergencies sometimes delay scheduled procedures.  2. Diet: Nothing to eat after midnight.   3. Hydration: You need to be well hydrated before your procedure. On November 18, you may drink approved liquids (see below) until 2 hours before the procedure, with 16 oz of water as your last intake.   List of approved liquids water, clear juice, clear tea, black coffee, fruit juices, non-citric and without pulp, carbonated beverages, Gatorade, Kool -Aid, plain Jello-O and plain ice popsicles.  4. Labs: You will need to have blood drawn on Tuesday, November 11 at [facility] (1st Floor), [address] [place], [place] [address]. You do not need to be fasting.  5. Medication instructions in preparation for your procedure: Do not take farxiga  for 3 days prior to procedure (last dose will be November 14) Do not take metformin  for 24 hours prior to procedure and for 48  hours after procedure   Contrast Allergy : No   On the morning of your procedure, take your Aspirin 81 mg and any morning medicines NOT listed above.  You may use sips of water.  6. Plan to go home the same day, you will only stay overnight if medically necessary. 7. Bring a current list of your medications and current insurance cards. 8. You MUST have a responsible person to drive you home. 9. Someone MUST be with you the first 24 hours after you  arrive home or your discharge will be delayed. 10. Please wear clothes that are easy to get on and off and wear slip-on shoes.  Thank you for allowing us  to care for you!   -- [facility]

## 2024-11-01 ENCOUNTER — Ambulatory Visit: Payer: Self-pay | Admitting: Physician Assistant

## 2024-11-01 ENCOUNTER — Other Ambulatory Visit: Payer: Self-pay | Admitting: Physician Assistant

## 2024-11-01 LAB — BASIC METABOLIC PANEL WITH GFR
BUN/Creatinine Ratio: 19 (ref 10–24)
BUN: 20 mg/dL (ref 8–27)
CO2: 22 mmol/L (ref 20–29)
Calcium: 10 mg/dL (ref 8.6–10.2)
Chloride: 100 mmol/L (ref 96–106)
Creatinine, Ser: 1.06 mg/dL (ref 0.76–1.27)
Glucose: 179 mg/dL — AB (ref 70–99)
Potassium: 4.5 mmol/L (ref 3.5–5.2)
Sodium: 138 mmol/L (ref 134–144)
eGFR: 77 mL/min/1.73 (ref >59)

## 2024-11-01 LAB — CBC
Hematocrit: 39.7 % (ref 37.5–51.0)
Hemoglobin: 13.6 g/dL (ref 13.0–17.7)
MCH: 31.8 pg (ref 26.6–33.0)
MCHC: 34.3 g/dL (ref 31.5–35.7)
MCV: 93 fL (ref 79–97)
Platelets: 183 x10E3/uL (ref 150–450)
RBC: 4.28 x10E6/uL (ref 4.14–5.80)
RDW: 12.7 % (ref 11.6–15.4)
WBC: 7.9 x10E3/uL (ref 3.4–10.8)

## 2024-11-01 NOTE — Progress Notes (Signed)
 Normal CBC (red blood cell count and platelet), stable renal function and electrolyte. Blood sugar was high, however this is not a fasting blood work anyway.

## 2024-11-02 ENCOUNTER — Telehealth: Payer: Self-pay | Admitting: *Deleted

## 2024-11-02 NOTE — Telephone Encounter (Signed)
 Cardiac Catheterization scheduled at [facility] for: Tuesday November 07, 2024 7:30 AM Arrival time [facility] Main Entrance A at: 5:30 AM  Diet: -Nothing to eat after midnight.  Hydration: -May drink clear liquids until 2 hours before the procedure.  Approved liquids: Water, clear tea, black coffee, fruit juices-non-citric and without pulp,Gatorade, plain Jello/popsicles.   -Please drink 16 oz of water 2 hours before procedure.  Medication instructions: -Hold:  Farxiga -AM of procedure   Metformin -day of procedure and 48 hours post procedure -Other usual morning medications can be taken including aspirin 81 mg.  Plan to go home the same day, you will only stay overnight if medically necessary.  You must have responsible adult to drive you home.  Someone must be with you the first 24 hours after you arrive home.  Reviewed procedure instructions with patient.

## 2024-11-06 MED ORDER — FREE WATER: 250.0000 mL | Freq: Once | Status: DC

## 2024-11-06 MED ORDER — SODIUM CHLORIDE 0.9% FLUSH: 3.0000 mL | Freq: Two times a day (BID) | INTRAVENOUS | Status: DC

## 2024-11-06 MED ORDER — ASPIRIN 81 MG PO CHEW: 81.0000 mg | CHEWABLE_TABLET | ORAL | Status: AC

## 2024-11-06 MED ORDER — SODIUM CHLORIDE 0.9 % IV SOLN: 250.0000 mL | INTRAVENOUS | Status: DC | PRN

## 2024-11-06 MED ORDER — SODIUM CHLORIDE 0.9% FLUSH: 3.0000 mL | INTRAVENOUS | Status: DC | PRN

## 2024-11-07 ENCOUNTER — Encounter (HOSPITAL_COMMUNITY)
Admission: RE | Disposition: A | Discharge status: Home / Self Care | Payer: Self-pay | Source: Home / Self Care | Attending: Cardiology

## 2024-11-07 ENCOUNTER — Ambulatory Visit (HOSPITAL_COMMUNITY)
Admission: RE | Admit: 2024-11-07 | Discharge: 2024-11-07 | Disposition: A | Discharge status: Home / Self Care | Attending: Cardiology | Admitting: Cardiology

## 2024-11-07 ENCOUNTER — Other Ambulatory Visit: Payer: Self-pay

## 2024-11-07 DIAGNOSIS — I2510 Atherosclerotic heart disease of native coronary artery without angina pectoris: Secondary | ICD-10-CM | POA: Insufficient documentation

## 2024-11-07 DIAGNOSIS — R9439 Abnormal result of other cardiovascular function study: Secondary | ICD-10-CM | POA: Diagnosis not present

## 2024-11-07 DIAGNOSIS — Z79899 Other long term (current) drug therapy: Secondary | ICD-10-CM | POA: Diagnosis not present

## 2024-11-07 DIAGNOSIS — I10 Essential (primary) hypertension: Secondary | ICD-10-CM | POA: Insufficient documentation

## 2024-11-07 DIAGNOSIS — E119 Type 2 diabetes mellitus without complications: Secondary | ICD-10-CM | POA: Diagnosis not present

## 2024-11-07 DIAGNOSIS — Z7984 Long term (current) use of oral hypoglycemic drugs: Secondary | ICD-10-CM | POA: Insufficient documentation

## 2024-11-07 DIAGNOSIS — R0609 Other forms of dyspnea: Secondary | ICD-10-CM | POA: Insufficient documentation

## 2024-11-07 DIAGNOSIS — E785 Hyperlipidemia, unspecified: Secondary | ICD-10-CM | POA: Insufficient documentation

## 2024-11-07 HISTORY — PX: LEFT HEART CATH AND CORONARY ANGIOGRAPHY: CATH118249

## 2024-11-07 LAB — GLUCOSE, CAPILLARY
Glucose-Capillary: 175 mg/dL — ABNORMAL HIGH (ref 70–99)
Glucose-Capillary: 178 mg/dL — ABNORMAL HIGH (ref 70–99)

## 2024-11-07 SURGERY — LEFT HEART CATH AND CORONARY ANGIOGRAPHY
Anesthesia: LOCAL

## 2024-11-07 MED ORDER — FENTANYL CITRATE (PF) 100 MCG/2ML IJ SOLN
INTRAMUSCULAR | Status: DC | PRN
  Administered 2024-11-07: 25 ug via INTRAVENOUS

## 2024-11-07 MED ORDER — VERAPAMIL HCL 2.5 MG/ML IV SOLN
INTRAVENOUS | Status: DC | PRN
  Administered 2024-11-07: 10 mL via INTRA_ARTERIAL

## 2024-11-07 MED ORDER — HEPARIN SODIUM (PORCINE) 1000 UNIT/ML IJ SOLN
INTRAMUSCULAR | Status: DC | PRN
  Administered 2024-11-07: 5500 [IU] via INTRAVENOUS

## 2024-11-07 MED ORDER — SODIUM CHLORIDE 0.9 % IV SOLN: 250.0000 mL | INTRAVENOUS | Status: DC | PRN

## 2024-11-07 MED ORDER — ONDANSETRON HCL 4 MG/2ML IJ SOLN: 4.0000 mg | Freq: Four times a day (QID) | INTRAMUSCULAR | Status: DC | PRN

## 2024-11-07 MED ORDER — SODIUM CHLORIDE 0.9% FLUSH: 3.0000 mL | INTRAVENOUS | Status: DC | PRN

## 2024-11-07 MED ORDER — HEPARIN (PORCINE) IN NACL 1000-0.9 UT/500ML-% IV SOLN
INTRAVENOUS | Status: DC | PRN
  Administered 2024-11-07 (×2): 500 mL

## 2024-11-07 MED ORDER — HYDRALAZINE HCL 20 MG/ML IJ SOLN: 10.0000 mg | INTRAMUSCULAR | Status: DC | PRN

## 2024-11-07 MED ORDER — LIDOCAINE HCL (PF) 1 % IJ SOLN
INTRAMUSCULAR | Status: DC | PRN
Start: 2024-11-07 — End: 2024-11-07
  Administered 2024-11-07: 2 mL via INTRADERMAL

## 2024-11-07 MED ORDER — FENTANYL CITRATE (PF) 100 MCG/2ML IJ SOLN
INTRAMUSCULAR | Status: AC
  Filled 2024-11-07: qty 2

## 2024-11-07 MED ORDER — MIDAZOLAM HCL 2 MG/2ML IJ SOLN
INTRAMUSCULAR | Status: AC
  Filled 2024-11-07: qty 2

## 2024-11-07 MED ORDER — LABETALOL HCL 5 MG/ML IV SOLN: 10.0000 mg | INTRAVENOUS | Status: DC | PRN

## 2024-11-07 MED ORDER — ACETAMINOPHEN 325 MG PO TABS: 650.0000 mg | ORAL_TABLET | ORAL | Status: DC | PRN

## 2024-11-07 MED ORDER — FREE WATER: 500.0000 mL | Freq: Once | Status: DC

## 2024-11-07 MED ORDER — VERAPAMIL HCL 2.5 MG/ML IV SOLN
INTRAVENOUS | Status: AC
  Filled 2024-11-07: qty 2

## 2024-11-07 MED ORDER — SODIUM CHLORIDE 0.9% FLUSH: 3.0000 mL | Freq: Two times a day (BID) | INTRAVENOUS | Status: DC

## 2024-11-07 MED ORDER — IOHEXOL 350 MG/ML SOLN
INTRAVENOUS | Status: DC | PRN
  Administered 2024-11-07: 35 mL

## 2024-11-07 MED ORDER — MIDAZOLAM HCL (PF) 2 MG/2ML IJ SOLN
INTRAMUSCULAR | Status: DC | PRN
  Administered 2024-11-07: 1 mg via INTRAVENOUS

## 2024-11-07 MED ORDER — HEPARIN SODIUM (PORCINE) 1000 UNIT/ML IJ SOLN
INTRAMUSCULAR | Status: AC
  Filled 2024-11-07: qty 10

## 2024-11-07 MED ORDER — LIDOCAINE HCL (PF) 1 % IJ SOLN
INTRAMUSCULAR | Status: AC
Start: 2024-11-07 — End: 2024-11-07
  Filled 2024-11-07: qty 30

## 2024-11-07 SURGICAL SUPPLY — 9 items
CATH INFINITI 5 FR JL3.5 (CATHETERS) IMPLANT
CATH INFINITI AMBI 5FR TG (CATHETERS) IMPLANT
DEVICE RAD COMP TR BAND LRG (VASCULAR PRODUCTS) IMPLANT
GLIDESHEATH SLEND A-KIT 6F 22G (SHEATH) IMPLANT
GUIDEWIRE INQWIRE 1.5J.035X260 (WIRE) IMPLANT
KIT SYRINGE INJ CVI SPIKEX1 (MISCELLANEOUS) IMPLANT
PACK CARDIAC CATHETERIZATION (CUSTOM PROCEDURE TRAY) ×1 IMPLANT
SET ATX-X65L (MISCELLANEOUS) IMPLANT
STATION PROTECTION PRESSURIZED (MISCELLANEOUS) IMPLANT

## 2024-11-07 NOTE — Progress Notes (Addendum)
 Discharge instructions reviewed with patient and Daughter at bedside. Denies questions concerns. PT tolerated PO intake. Ambulated to the bathroom and voided without difficulty. Site remains clean, dry, and intact. No s/s of complications. PT escorted from the unit via wheel chair to personal vehicle.

## 2024-11-07 NOTE — Interval H&P Note (Signed)
 History and Physical Interval Note:  11/07/2024 7:38 AM  Craig Arnold  has presented today for surgery, with the diagnosis of abnormal stress test - shortness of breath.  The various methods of treatment have been discussed with the patient and family. After consideration of risks, benefits and other options for treatment, the patient has consented to  Procedure(s): LEFT HEART CATH AND CORONARY ANGIOGRAPHY (N/A) as a surgical intervention.  The patient's history has been reviewed, patient examined, no change in status, stable for surgery.  I have reviewed the patient's chart and labs.  Questions were answered to the patient's satisfaction.     Romonia Yanik J Magenta Schmiesing

## 2024-11-07 NOTE — Discharge Instructions (Signed)
 Radial Site Care The following information offers guidance on how to care for yourself after your procedure. Your health care provider may also give you more specific instructions. If you have problems or questions, contact your health care provider. What can I expect after the procedure? After the procedure, it is common to have bruising and tenderness in the incision area. Follow these instructions at home: Incision site care  Follow instructions from your health care provider about how to take care of your incision site. Make sure you: Wash your hands with soap and water for at least 20 seconds before and after you change your bandage (dressing). If soap and water are not available, use hand sanitizer. Remove your dressing in 24 hours. Leave stitches (sutures), skin glue, or adhesive strips in place. These skin closures may need to stay in place for 2 weeks or longer. If adhesive strip edges start to loosen and curl up, you may trim the loose edges. Do not remove adhesive strips completely unless your health care provider tells you to do that. Do not take baths, swim, or use a hot tub for at least 1 week. You may shower 24 hours after the procedure or as told by your health care provider. Remove the dressing and gently wash the incision area with plain soap and water. Pat the area dry with a clean towel. Do not rub the site. That could cause bleeding. Do not apply powder or lotion to the site. Check your incision site every day for signs of infection. Check for: Redness, swelling, or pain. Fluid or blood. Warmth. Pus or a bad smell. Activity For 24 hours after the procedure, or as directed by your health care provider: Do not flex or bend the affected arm. Do not push or pull heavy objects with the affected arm. Do not operate machinery or power tools. Do not drive. You should not drive yourself home from the hospital or clinic if you go home during that time period. You may drive 24  hours after the procedure unless your health care provider tells you not to. Do not lift anything that is heavier than 10 lb (4.5 kg), or the limit that you are told, until your health care provider says that it is safe. Return to your normal activities as told by your health care provider. Ask your health care provider what activities are safe for you and when you can return to work. If you were given a sedative during the procedure, it can affect you for several hours. Do not drive or operate machinery until your health care provider says that it is safe. General instructions Take over-the-counter and prescription medicines only as told by your health care provider. If you will be going home right after the procedure, plan to have a responsible adult care for you for the time you are told. This is important. Keep all follow-up visits. This is important. Contact a health care provider if: You have a fever or chills. You have any of these signs of infection at your incision site: Redness, swelling, or pain. Fluid or blood. Warmth. Pus or a bad smell. Get help right away if: The incision area swells very fast. The incision area is bleeding, and the bleeding does not stop when you hold steady pressure on the area. Your arm or hand becomes pale, cool, tingly, or numb. These symptoms may represent a serious problem that is an emergency. Do not wait to see if the symptoms will go away. Get medical  help right away. Call your local emergency services (911 in the [place] Do not drive yourself to the hospital. Summary After the procedure, it is common to have bruising and tenderness at the incision site. Follow instructions from your health care provider about how to take care of your radial site incision. Check the incision every day for signs of infection. Do not lift anything that is heavier than 10 lb (4.5 kg), or the limit that you are told, until your health care provider says that it is  safe. Get help right away if the incision area swells very fast, you have bleeding at the incision site that will not stop, or your arm or hand becomes pale, cool, or numb. This information is not intended to replace advice given to you by your health care provider. Make sure you discuss any questions you have with your health care provider. Document Revised: 01/26/2021 Document Reviewed: 01/26/2021 Elsevier Patient Education  2024 [employer].

## 2024-11-07 NOTE — H&P (Signed)
 OV 10/31/2024 copief for documentation   Cardiology Office Note   Date:  11/07/2024  ID:  Dareld, Mcauliffe 03-28-1956, MRN [id number] PCP: Dettinger, Fonda LABOR, MD  [facility] Providers Cardiologist:  Lynwood Schilling, MD     History of Present Illness Craig Arnold is a 68 y.o. male with past medical history of COPD, bronchiectasis, history of atypical Mycobacterium infection, hypertension, hyperlipidemia and DM2.  Patient with referred to Dr. Schilling for evaluation of elevated calcium  score.  He reportedly had a stress test several years ago but never been diagnosed with cardiac issues in the past.  He has very strong family history of heart issue.  He gets short of breath walking a mile on flat ground.  Subsequent PET stress test obtained on 10/09/2024 showed EF 63%, medium defect of moderate reduction in uptake in the apical basal inferior location that is reversible, consistent with RCA ischemia.  No significant extracardiac finding.  Patient presents today for evaluation based on the recent abnormal stress test.  I have reviewed the stress test results with the patient.  He denies any chest pain, his main symptom is dyspnea on exertion.  He has a history of atypical Mycobacterium infection and bronchiectasis/COPD.  I still suspect majority of his dyspnea on exertion is related to underlying lung issue however underlying coronary artery disease may be contributing in part to the dyspnea on exertion.  I will obtain basic metabolic panel and a CBC.  The risk and benefit of the procedure has been explained to the patient who is agreeable to proceed.  Patient will need to hold Farxiga  for 3 days prior to the procedure and to hold metformin  for 24 hours before and 48 hours after the procedure.  I will plan to see the patient back in 4 weeks for reassessment  ROS:   Patient denies any chest pain, he has dyspnea on exertion for several years.  He has no lower extremity edema, orthopnea or  PND  Studies Reviewed      Cardiac Studies & Procedures   ______________________________________________________________________________________________   STRESS TESTS  NM PET CT CARDIAC PERFUSION MULTI W/ABSOLUTE BLOODFLOW 10/17/2024  Narrative   Findings are consistent with RCA ischemia with relative decrease in stress flows despite isolated LAD calcification. The study is intermediate risk due to decreased stress flows and amount of myocardium affected.   LV perfusion is abnormal. There is evidence of ischemia. There is no evidence of infarction. Defect 1: There is a medium defect with moderate reduction in uptake present in the apical to basal inferior location(s) that is reversible. There is normal wall motion in the defect area. Consistent with ischemia.   Rest left ventricular function is normal. Rest EF: 58%. Stress left ventricular function is normal. Stress EF: 63%. End diastolic cavity size is mildly enlarged. End systolic cavity size is normal.   Myocardial blood flow was computed to be 1.54ml/g/min at rest and 3.78ml/g/min at stress. Global myocardial blood flow reserve was 1.81 and was mildly abnormal.   Coronary calcium  was present on the attenuation correction CT images. Moderate coronary calcifications were present. Coronary calcifications were present in the left anterior descending artery distribution(s).   Electronically Signed  By: Stanly Leavens M.D.  EXAM: OVER-READ INTERPRETATION  CT CHEST  The following report is a limited chest CT over-read performed by radiologist Dr. Elsie Ko [facility] on 10/17/2024. This over-read does not include interpretation of cardiac or coronary anatomy or pathology nor does it include evaluation of the  PET data. The cardiac PET-CT interpretation by the cardiologist is attached.  COMPARISON:  Chest CT 03/17/2024  FINDINGS: Mediastinum/Nodes: No enlarged lymph nodes within the  visualized mediastinum.Aortic and coronary artery atherosclerosis noted.  Lungs/Pleura: There is no pleural effusion. The visualized lungs appear clear.  Upper abdomen: No significant findings in the visualized upper abdomen.  Musculoskeletal/Chest wall: No chest wall mass or suspicious osseous findings within the visualized chest on axial imaging. Assessment of the thoracolumbar junction limited by motion artifact. Mild spondylosis. Mild bilateral gynecomastia.  IMPRESSION: No significant extracardiac findings within the visualized chest.   Electronically Signed By: Elsie Perone M.D. On: 10/17/2024 10:42            ______________________________________________________________________________________________      Risk Assessment/Calculations          Physical Exam VS:  BP (!) 148/87   Pulse 97   Temp 98.2 F (36.8 C) (Oral)   Resp 18   Ht 6' 1 (1.854 m)   Wt 106.1 kg   SpO2 98%   BMI 30.87 kg/m        Wt Readings from Last 3 Encounters:  11/07/24 106.1 kg  10/31/24 108.4 kg  09/13/24 106.1 kg    GEN: Well nourished, well developed in no acute distress NECK: No JVD; No carotid bruits CARDIAC: RRR, no murmurs, rubs, gallops RESPIRATORY:  Clear to auscultation without rales, wheezing or rhonchi  ABDOMEN: Soft, non-tender, non-distended EXTREMITIES:  No edema; No deformity   ASSESSMENT AND PLAN  Abnormal stress test: Recent PET stress test was medium defect of moderate reduction in uptake in the apical basal inferior location that is reversible, consistent with RCA ischemia.  Patient does not have chest pain but has significant dyspnea with exertion.  Some of his dyspnea may be related to a lung issue, however suspicion is that underlying coronary artery disease may be contributing in part to his shortness of breath with exertion.  Risk and benefit of cardiac catheterization has been discussed with the patient who is agreeable to  proceed.  Hypertension: Blood pressure is elevated today, however it is normally well-controlled.  I will hold off on adjusting blood pressure medication due to a single days elevated blood pressure  Hyperlipidemia: On rosuvastatin  20 mg daily.  DM2: Managed by primary care provider.  Hold Farxiga  for 3 days prior to the cath.  Hold metformin  for 24 hours before and 48 hours after the cath.    Informed Consent   Shared Decision Making/Informed Consent The risks [stroke (1 in 1000), death (1 in 1000), kidney failure [usually temporary] (1 in 500), bleeding (1 in 200), allergic reaction [possibly serious] (1 in 200)], benefits (diagnostic support and management of coronary artery disease) and alternatives of a cardiac catheterization were discussed in detail with Craig Arnold and he is willing to proceed.     Dispo: Follow-up in 4 weeks for reassessment post cath  Signed, Newman JINNY Lawrence, MD

## 2024-11-08 ENCOUNTER — Encounter (HOSPITAL_COMMUNITY): Payer: Self-pay | Admitting: Cardiology

## 2024-11-20 ENCOUNTER — Other Ambulatory Visit: Payer: Self-pay | Admitting: Nurse Practitioner

## 2024-11-30 ENCOUNTER — Ambulatory Visit: Admitting: Physician Assistant

## 2024-12-16 ENCOUNTER — Other Ambulatory Visit: Payer: Self-pay | Admitting: Family Medicine

## 2024-12-16 DIAGNOSIS — M25511 Pain in right shoulder: Secondary | ICD-10-CM

## 2024-12-18 ENCOUNTER — Encounter: Payer: Self-pay | Admitting: Family Medicine

## 2024-12-18 ENCOUNTER — Ambulatory Visit: Payer: Self-pay | Admitting: Family Medicine

## 2024-12-18 ENCOUNTER — Other Ambulatory Visit: Payer: Self-pay | Admitting: Emergency Medicine

## 2024-12-18 ENCOUNTER — Other Ambulatory Visit: Payer: Self-pay | Admitting: Family Medicine

## 2024-12-18 VITALS — BP 132/55 | HR 85 | Ht 73.0 in | Wt 237.0 lb

## 2024-12-18 DIAGNOSIS — K219 Gastro-esophageal reflux disease without esophagitis: Secondary | ICD-10-CM

## 2024-12-18 DIAGNOSIS — E1159 Type 2 diabetes mellitus with other circulatory complications: Secondary | ICD-10-CM | POA: Diagnosis not present

## 2024-12-18 DIAGNOSIS — E1169 Type 2 diabetes mellitus with other specified complication: Secondary | ICD-10-CM | POA: Diagnosis not present

## 2024-12-18 DIAGNOSIS — J449 Chronic obstructive pulmonary disease, unspecified: Secondary | ICD-10-CM | POA: Diagnosis not present

## 2024-12-18 DIAGNOSIS — Z7984 Long term (current) use of oral hypoglycemic drugs: Secondary | ICD-10-CM

## 2024-12-18 DIAGNOSIS — I152 Hypertension secondary to endocrine disorders: Secondary | ICD-10-CM

## 2024-12-18 LAB — LIPID PANEL

## 2024-12-18 LAB — BAYER DCA HB A1C WAIVED: HB A1C (BAYER DCA - WAIVED): 8.4 % — ABNORMAL HIGH (ref 4.8–5.6)

## 2024-12-18 MED ORDER — OMEPRAZOLE 20 MG PO CPDR: 20.0000 mg | DELAYED_RELEASE_CAPSULE | Freq: Every day | ORAL | 3 refills | Status: AC

## 2024-12-18 MED ORDER — METFORMIN HCL 1000 MG PO TABS: 1000.0000 mg | ORAL_TABLET | Freq: Two times a day (BID) | ORAL | 3 refills | Status: DC

## 2024-12-18 MED ORDER — OLMESARTAN MEDOXOMIL 20 MG PO TABS: 20.0000 mg | ORAL_TABLET | Freq: Every day | ORAL | 3 refills | Status: AC

## 2024-12-18 MED ORDER — DAPAGLIFLOZIN PROPANEDIOL 10 MG PO TABS: 10.0000 mg | ORAL_TABLET | Freq: Every day | ORAL | 3 refills | Status: DC

## 2024-12-18 MED ORDER — ACCU-CHEK SOFTCLIX LANCETS MISC: 3 refills | Status: AC

## 2024-12-18 MED ORDER — ROSUVASTATIN CALCIUM 20 MG PO TABS: 20.0000 mg | ORAL_TABLET | Freq: Every day | ORAL | 3 refills | Status: DC

## 2024-12-18 NOTE — Progress Notes (Signed)
 "  BP (!) 132/55   Pulse 85   Ht 6' 1 (1.854 m)   Wt 237 lb (107.5 kg)   SpO2 97%   BMI 31.27 kg/m    Subjective:   Patient ID: Craig Arnold, male    DOB: [birth year], 68 y.o.   MRN: [id number]  HPI: Craig Arnold is a 68 y.o. male presenting on 12/18/2024 for Medical Management of Chronic Issues, Diabetes, and Hypertension   Discussed the use of AI scribe software for clinical note transcription with the patient, who gave verbal consent to proceed.  History of Present Illness   Craig Arnold is a 68 year old male with diabetes who presents for a recheck of his blood sugar management.  Glycemic control and diabetes medication management - Diabetes mellitus with uncertainty regarding current medication regimen. - Has not taken Farxiga  (dapagliflozin ) for an extended period, discontinued approximately three weeks ago for a vein procedure. - Previously received assistance with prescription access for Farxiga  earlier in the year. - Currently takes metformin  but is unsure about dosing instructions and timing. - Expresses confusion regarding the total number and type of diabetes medications, estimating intake of eleven to twelve pills daily.  Respiratory symptoms and inhaler use - Uses Symbicort  as needed for chest tightness, similar to an inhaler. - Symbicort  provides relief of chest tightness within five to ten minutes. - Experiences intermittent wheezing, attributed to weather changes. - Able to perform daily activities without significant limitation.  Hyperlipidemia management - Continues to take rosuvastatin  for cholesterol management.          Relevant past medical, surgical, family and social history reviewed and updated as indicated. Interim medical history since our last visit reviewed. Allergies and medications reviewed and updated.  Review of Systems  Constitutional:  Negative for chills and fever.  Eyes:  Negative for visual disturbance.  Respiratory:  Positive for  wheezing. Negative for shortness of breath.   Cardiovascular:  Negative for chest pain and leg swelling.  Skin:  Negative for rash.  Neurological:  Negative for dizziness and light-headedness.  All other systems reviewed and are negative.   Per HPI unless specifically indicated above   Allergies as of 12/18/2024   No Known Allergies      Medication List        Accurate as of December 18, 2024  1:18 PM. If you have any questions, ask your nurse or doctor.          STOP taking these medications    Airsupra  90-80 MCG/ACT Aero Generic drug: Albuterol -Budesonide  Stopped by: Fonda Levins, MD       TAKE these medications    Accu-Chek Guide Test test strip Generic drug: glucose blood CHECK BLOOD SUGAR 3 TIMES DAILY Dx E11.69   Accu-Chek Guide w/Device Kit Check BS 3 times a day Dx E11.69   Accu-Chek Softclix Lancet Dev Kit CHECK BLOOD SUGARS 3 TIMES A DAY Dx E11.69   Accu-Chek Softclix Lancets lancets CHECK BLOOD SUGAR 3 TIMES DAILY Dx E11.69   albuterol  108 (90 Base) MCG/ACT inhaler Commonly known as: VENTOLIN  HFA INHALE 2 PUFFS INTO THE LUNGS EVERY 4 HOURS AS NEEDED   budesonide -formoterol  160-4.5 MCG/ACT inhaler Commonly known as: Symbicort  INHALE 2 PUFFS INTO THE LUNGS TWICE A DAY What changed:  how much to take how to take this when to take this reasons to take this additional instructions   busPIRone  7.5 MG tablet Commonly known as: BUSPAR  TAKE 1 TABLET BY MOUTH TWICE A DAY   dapagliflozin   propanediol 10 MG Tabs tablet Commonly known as: Farxiga  Take 1 tablet (10 mg total) by mouth daily before breakfast.   diclofenac  75 MG EC tablet Commonly known as: VOLTAREN  TAKE 1 TABLET BY MOUTH TWICE A DAY   fluticasone  50 MCG/ACT nasal spray Commonly known as: FLONASE  Place 1 spray into both nostrils 2 (two) times daily as needed for allergies or rhinitis.   loratadine 10 MG tablet Commonly known as: CLARITIN Take 10 mg by mouth daily as needed  for allergies.  [employer] brand   metFORMIN  1000 MG tablet Commonly known as: GLUCOPHAGE  Take 1 tablet (1,000 mg total) by mouth 2 (two) times daily with a meal.   MULTIVITAMIN ADULTS 50+ PO Take 1 tablet by mouth daily.   olmesartan  20 MG tablet Commonly known as: BENICAR  Take 1 tablet (20 mg total) by mouth daily.   omeprazole  20 MG capsule Commonly known as: PRILOSEC Take 1 capsule (20 mg total) by mouth daily. What changed: how much to take Changed by: Fonda Levins, MD   predniSONE  1 MG tablet Commonly known as: DELTASONE  TAKE 3 TABLETS (3 MG TOTAL) BY MOUTH DAILY WITH BREAKFAST.   roflumilast  500 MCG Tabs tablet Commonly known as: DALIRESP  Take 1 tablet (500 mcg total) by mouth daily.   rosuvastatin  20 MG tablet Commonly known as: CRESTOR  Take 1 tablet (20 mg total) by mouth daily.         Objective:   BP (!) 132/55   Pulse 85   Ht 6' 1 (1.854 m)   Wt 237 lb (107.5 kg)   SpO2 97%   BMI 31.27 kg/m   Wt Readings from Last 3 Encounters:  12/18/24 237 lb (107.5 kg)  11/07/24 234 lb (106.1 kg)  10/31/24 239 lb (108.4 kg)    Physical Exam Vitals and nursing note reviewed.  Constitutional:      Appearance: Normal appearance.  Neurological:     Mental Status: He is alert.    Physical Exam   NECK: Thyroid  non-tender, no lumps. CHEST: Wheezing present bilaterally. CARDIOVASCULAR: Heart sounds regular, no murmurs. EXTREMITIES: No swelling in extremities.         Assessment & Plan:   Problem List Items Addressed This Visit       Cardiovascular and Mediastinum   Hypertension associated with diabetes (HCC)   Relevant Medications   metFORMIN  (GLUCOPHAGE ) 1000 MG tablet   olmesartan  (BENICAR ) 20 MG tablet   rosuvastatin  (CRESTOR ) 20 MG tablet   dapagliflozin  propanediol (FARXIGA ) 10 MG TABS tablet   Other Relevant Orders   AMB Referral VBCI Care Management     Respiratory   COPD (chronic obstructive pulmonary disease) (HCC)      Digestive   GERD   Relevant Medications   omeprazole  (PRILOSEC) 20 MG capsule     Endocrine   Type 2 diabetes mellitus with other specified complication (HCC) - Primary   Relevant Medications   Accu-Chek Softclix Lancets lancets   metFORMIN  (GLUCOPHAGE ) 1000 MG tablet   olmesartan  (BENICAR ) 20 MG tablet   rosuvastatin  (CRESTOR ) 20 MG tablet   dapagliflozin  propanediol (FARXIGA ) 10 MG TABS tablet   Other Relevant Orders   Bayer DCA Hb A1c Waived   CBC with Differential/Platelet   CMP14+EGFR   Lipid panel   Vitamin B12   AMB Referral VBCI Care Management       Type 2 diabetes mellitus with other specified complication A1c elevated at 8.4, indicating suboptimal glycemic control. Farxiga  not taken for one year due to medication  hold. - Restart Farxiga  10 mg once daily in the morning. - Referred to clinical pharmacist Mliss for medication management and prescription refill.  Chronic obstructive pulmonary disease Intermittent wheezing, Symbicort  provides short-term relief. Weather changes may exacerbate symptoms. - Advised to use Symbicort  as needed for wheezing relief.          Follow up plan: Return in about 3 months (around 03/18/2025), or if symptoms worsen or fail to improve, for Diabetes.  Counseling provided for all of the vaccine components Orders Placed This Encounter  Procedures   Bayer DCA Hb A1c Waived   CBC with Differential/Platelet   CMP14+EGFR   Lipid panel   Vitamin B12   AMB Referral VBCI Care Management    Fonda Levins, MD [facility] 12/18/2024, 1:18 PM     "

## 2024-12-18 NOTE — Telephone Encounter (Signed)
 Last OV 12/18/24. Last RF 09/13/24. Next OV 03/19/25

## 2024-12-19 LAB — LIPID PANEL
Cholesterol, Total: 105 mg/dL (ref 100–199)
HDL: 42 mg/dL
LDL CALC COMMENT:: 2.5 ratio (ref 0.0–5.0)
LDL Chol Calc (NIH): 30 mg/dL (ref 0–99)
Triglycerides: 207 mg/dL — AB (ref 0–149)
VLDL Cholesterol Cal: 33 mg/dL (ref 5–40)

## 2024-12-19 LAB — CBC WITH DIFFERENTIAL/PLATELET
Basophils Absolute: 0.1 x10E3/uL (ref 0.0–0.2)
Basos: 1 %
EOS (ABSOLUTE): 0.8 x10E3/uL — ABNORMAL HIGH (ref 0.0–0.4)
Eos: 10 %
Hematocrit: 41.1 % (ref 37.5–51.0)
Hemoglobin: 13.8 g/dL (ref 13.0–17.7)
Immature Grans (Abs): 0 x10E3/uL (ref 0.0–0.1)
Immature Granulocytes: 0 %
Lymphocytes Absolute: 1.6 x10E3/uL (ref 0.7–3.1)
Lymphs: 21 %
MCH: 31.8 pg (ref 26.6–33.0)
MCHC: 33.6 g/dL (ref 31.5–35.7)
MCV: 95 fL (ref 79–97)
Monocytes Absolute: 1.5 x10E3/uL — ABNORMAL HIGH (ref 0.1–0.9)
Monocytes: 20 %
Neutrophils Absolute: 3.6 x10E3/uL (ref 1.4–7.0)
Neutrophils: 48 %
Platelets: 197 x10E3/uL (ref 150–450)
RBC: 4.34 x10E6/uL (ref 4.14–5.80)
RDW: 12.9 % (ref 11.6–15.4)
WBC: 7.6 x10E3/uL (ref 3.4–10.8)

## 2024-12-19 LAB — VITAMIN B12: Vitamin B-12: 831 pg/mL (ref 232–1245)

## 2024-12-19 LAB — CMP14+EGFR
ALT: 25 IU/L (ref 0–44)
AST: 22 IU/L (ref 0–40)
Albumin: 4.6 g/dL (ref 3.9–4.9)
Alkaline Phosphatase: 86 IU/L (ref 47–123)
BUN/Creatinine Ratio: 20 (ref 10–24)
BUN: 20 mg/dL (ref 8–27)
Bilirubin Total: 0.6 mg/dL (ref 0.0–1.2)
CO2: 22 mmol/L (ref 20–29)
Calcium: 10.3 mg/dL — AB (ref 8.6–10.2)
Chloride: 100 mmol/L (ref 96–106)
Creatinine, Ser: 1.02 mg/dL (ref 0.76–1.27)
Globulin, Total: 2.7 g/dL (ref 1.5–4.5)
Glucose: 179 mg/dL — AB (ref 70–99)
Potassium: 5 mmol/L (ref 3.5–5.2)
Sodium: 137 mmol/L (ref 134–144)
Total Protein: 7.3 g/dL (ref 6.0–8.5)
eGFR: 81 mL/min/1.73

## 2024-12-25 ENCOUNTER — Ambulatory Visit: Payer: Self-pay | Admitting: Family Medicine

## 2025-01-10 ENCOUNTER — Other Ambulatory Visit (INDEPENDENT_AMBULATORY_CARE_PROVIDER_SITE_OTHER)

## 2025-01-10 DIAGNOSIS — E1159 Type 2 diabetes mellitus with other circulatory complications: Secondary | ICD-10-CM

## 2025-01-10 DIAGNOSIS — I152 Hypertension secondary to endocrine disorders: Secondary | ICD-10-CM

## 2025-01-10 DIAGNOSIS — Z7984 Long term (current) use of oral hypoglycemic drugs: Secondary | ICD-10-CM

## 2025-01-10 DIAGNOSIS — E119 Type 2 diabetes mellitus without complications: Secondary | ICD-10-CM

## 2025-01-10 DIAGNOSIS — E1169 Type 2 diabetes mellitus with other specified complication: Secondary | ICD-10-CM

## 2025-01-10 MED ORDER — DAPAGLIFLOZIN PROPANEDIOL 10 MG PO TABS: 10.0000 mg | ORAL_TABLET | Freq: Every day | ORAL | 3 refills | Status: DC

## 2025-01-10 MED ORDER — METFORMIN HCL 1000 MG PO TABS: 1000.0000 mg | ORAL_TABLET | Freq: Two times a day (BID) | ORAL | 3 refills | Status: AC

## 2025-01-10 MED ORDER — ROSUVASTATIN CALCIUM 20 MG PO TABS: 20.0000 mg | ORAL_TABLET | Freq: Every day | ORAL | 3 refills | Status: AC

## 2025-01-10 NOTE — Progress Notes (Signed)
 "  01/10/2025 Name: Craig Arnold MRN: [id number] DOB: 1956-04-29  Chief Complaint  Patient presents with   Diabetes    Craig Arnold is a 69 y.o. year old male who presented for a telephone visit.  I connected with  Craig Arnold on 01/10/25 by telephone and verified that I am speaking with the correct person using two identifiers. I discussed the limitations of evaluation and management by telemedicine. The patient expressed understanding and agreed to proceed.  Patient was located in her home and PharmD in PCP office during this visit.    They were referred to the pharmacist by their PCP for assistance in managing diabetes and hyperlipidemia.    Subjective:  Care Team: Primary Care Provider: Dettinger, Fonda LABOR, MD ; Next Scheduled Visit: 03/06/2024  Medication Access/Adherence  Current Pharmacy:  [id number] - [place], [place] - [address] [place] [address] Phone: [phone] Fax: [phone]  [id number] - [place], [place] - [address] [place] [address] Phone: [phone] Fax: [phone]   Patient reports affordability concerns with their medications: Yes  - Januvia  Patient reports access/transportation concerns to their pharmacy: No  Patient reports adherence concerns with their medications:  Yes     Diabetes:  Current medications: metformin  1000 mg BID (patient reports taking 500mg  - 2 tablets in AM and 1 tablet in PM as he is concerned taking 1000 mg twice a day will cause low blood sugar)never picked up farxiga /jardiance  in the past Medications tried in the past: Januvia  (stopped d/t cost)   Current glucose readings: not checking blood sugar unless he feels like he needs to  Patient denies hypoglycemic s/sx including dizziness, shakiness, sweating. Patient denies hyperglycemic symptoms including polyuria, polydipsia, polyphagia, nocturia, neuropathy, blurred  vision.  Current meal patterns:  - [employer] biscuit in AM - baked meat, green beans, bread, peanut butter, moon pies, bologna sandwich - Occasional no sugar added ice cream, has cut out moon pies - Has increased water  intake recently - drinking ~2 cups of water  per day, zero sugar mountain dew, no alcohol  Hyperlipidemia/ASCVD Risk Reduction  Current lipid lowering medications: rosuvastatin  20 mg daily  ASCVD History: none Family History: father- heart attack, stroke; mother- diabetes; brother- diabetes, heart attack Risk Factors: T2DM, HTN, obesity  Objective:  Lab Results  Component Value Date   HGBA1C 8.4 (H) 12/18/2024    Lab Results  Component Value Date   CREATININE 1.02 12/18/2024   BUN 20 12/18/2024   NA 137 12/18/2024   K 5.0 12/18/2024   CL 100 12/18/2024   CO2 22 12/18/2024    Lab Results  Component Value Date   CHOL 105 12/18/2024   HDL 42 12/18/2024   LDLCALC 30 12/18/2024   LDLDIRECT 103.7 08/16/2007   TRIG 207 (H) 12/18/2024   CHOLHDL 2.5 12/18/2024    Medications Reviewed Today     Reviewed by Billee Mliss BIRCH, RPH-CPP (Pharmacist) on 01/10/25 at 1345  Med List Status: <None>   Medication Order Taking? Sig Documenting Provider Last Dose Status Informant  Accu-Chek Softclix Lancets lancets [id number]  CHECK BLOOD SUGAR 3 TIMES DAILY Dx E11.69 Dettinger, Fonda LABOR, MD  Active   albuterol  (VENTOLIN  HFA) 108 (90 Base) MCG/ACT inhaler [id number]  INHALE 2 PUFFS INTO THE LUNGS EVERY 4 HOURS AS NEEDED Shelah Lamar RAMAN, MD  Active   Blood Glucose Monitoring Suppl (ACCU-CHEK GUIDE) w/Device KIT [id number]  Check BS 3 times  a day Dx E11.69 Dettinger, Fonda LABOR, MD  Active Self  budesonide -formoterol  (SYMBICORT ) 160-4.5 MCG/ACT inhaler [id number]  INHALE 2 PUFFS INTO THE LUNGS TWICE A DAY  Patient taking differently: Inhale 2 puffs into the lungs daily as needed (Shortness of breath).   Parrett, Madelin RAMAN, NP  Active Self  busPIRone  (BUSPAR ) 7.5 MG tablet [id number]   TAKE 1 TABLET BY MOUTH TWICE A DAY Dettinger, Fonda LABOR, MD  Active Self  dapagliflozin  propanediol (FARXIGA ) 10 MG TABS tablet [id number]  Take 1 tablet (10 mg total) by mouth daily before breakfast. Dettinger, Fonda LABOR, MD  Active   diclofenac  (VOLTAREN ) 75 MG EC tablet [id number]  TAKE 1 TABLET BY MOUTH TWICE A DAY Dettinger, Fonda LABOR, MD  Active   fluticasone  (FLONASE ) 50 MCG/ACT nasal spray [id number]  Place 1 spray into both nostrils 2 (two) times daily as needed for allergies or rhinitis. Dettinger, Fonda LABOR, MD  Active Self  glucose blood (ACCU-CHEK GUIDE TEST) test strip [id number]  CHECK BLOOD SUGAR 3 TIMES DAILY Dx E11.69 Dettinger, Fonda LABOR, MD  Active Self  Lancets Misc. (ACCU-CHEK SOFTCLIX LANCET DEV) KIT [id number]  CHECK BLOOD SUGARS 3 TIMES A DAY Dx E11.69 Dettinger, Fonda LABOR, MD  Active Self  loratadine (CLARITIN) 10 MG tablet [id number]  Take 10 mg by mouth daily as needed for allergies.  [employer] brand [provider]  Active Self  metFORMIN  (GLUCOPHAGE ) 1000 MG tablet [id number]  Take 1 tablet (1,000 mg total) by mouth 2 (two) times daily with a meal. Dettinger, Fonda LABOR, MD  Active   Multiple Vitamins-Minerals (MULTIVITAMIN ADULTS 50+ PO) [id number]  Take 1 tablet by mouth daily. [provider]  Active Self  olmesartan  (BENICAR ) 20 MG tablet [id number]  Take 1 tablet (20 mg total) by mouth daily. Dettinger, Fonda LABOR, MD  Active   omeprazole  (PRILOSEC) 20 MG capsule [id number]  Take 1 capsule (20 mg total) by mouth daily. Dettinger, Fonda LABOR, MD  Active   predniSONE  (DELTASONE ) 1 MG tablet [id number]  TAKE 3 TABLETS (3 MG TOTAL) BY MOUTH DAILY WITH BREAKFAST. Byrum, Robert S, MD  Active Self  roflumilast  (DALIRESP ) 500 MCG TABS tablet [id number]  Take 1 tablet (500 mcg total) by mouth daily. Shelah Lamar RAMAN, MD  Active Self  rosuvastatin  (CRESTOR ) 20 MG tablet [id number]  Take 1 tablet (20 mg total) by mouth daily. Dettinger, Fonda LABOR, MD  Active                Assessment/Plan:   Diabetes: - Currently uncontrolled with A1c 8.4%, above goal <7%. Patient reporting BG are slightly elevated as well. - Reviewed long term cardiovascular and renal outcomes of uncontrolled blood sugar - Reviewed goal A1c, goal fasting, and goal 2 hour post prandial glucose - Reviewed dietary modifications including limiting portion size of carbohydrates and sweets (ice cream).  - Recommend to continue metformin  1000 mg BID. Encouraged patient to take as prescribed. Discussed this is very low risk of hypoglycemia and he agreed to take as prescribed.  - Recommend to start Farxiga  10mg  daily. RX sent in-- > Will see if cost prohibitive. If he does start on SGLT-2, would likely need to submit patient assistance application.  - Recommend to check fasting blood glucose once daily - A1c due at next PCP visit  Hyperlipidemia/ASCVD Risk Reduction: - Currently uncontrolled based on LDL 97 mg/dl, above goal <29 mg/dl and TG elevated at 761 mg/dl. Switched to rosuvastatin  at last telephone visit and he is tolerating well. -  Reviewed long term complications of uncontrolled cholesterol - Reviewed lifestyle recommendations including limiting intake of processed foods and meats. - Recommend to continue rosuvastatin  20 mg daily  - F/u lipid panel due at next PCP visit   Follow Up Plan: PCP on 03/19/25 and PharmD encouraged patient to reach out   Mliss Tarry Griffin, PharmD, BCACP, CPP Clinical Pharmacist, [facility]    "

## 2025-01-17 ENCOUNTER — Ambulatory Visit: Admitting: Cardiology

## 2025-01-19 ENCOUNTER — Other Ambulatory Visit: Payer: Self-pay | Admitting: Emergency Medicine

## 2025-01-22 ENCOUNTER — Other Ambulatory Visit: Payer: Self-pay | Admitting: Family Medicine

## 2025-01-22 DIAGNOSIS — E1169 Type 2 diabetes mellitus with other specified complication: Secondary | ICD-10-CM

## 2025-01-22 NOTE — Telephone Encounter (Signed)
 I am okay to try and send Jardiance  25 mg daily for him and give him 6 months worth.  If that 1 ends up being elevated as well then he likely will need to see Mliss and go through prescription assistance programs so placed a referral to [facility] please

## 2025-01-22 NOTE — Telephone Encounter (Signed)
 FARXIGA  10 MG TABS tablet  Pharmacy comment: Alternative Requested:COST OF MEDICATION IS $456 WITH INSUANCE. PLEASE SEND ALTERNATIVE. THANK YOU.

## 2025-01-23 ENCOUNTER — Telehealth: Payer: Self-pay | Admitting: Family Medicine

## 2025-01-23 NOTE — Telephone Encounter (Unsigned)
 Copied from CRM [id number]. Topic: Clinical - Prescription Issue >> Jan 23, 2025  4:31 PM Graeme ORN wrote: Reason for CRM: Patient called states he is unable to afford Farxiga . States pharmacy reached out to provider but has not received a response. Patient states he would like a call back to discuss options. States insurance says its so much because its a name brand drug and the cost is only for the 1st fill after 1st fill should go down. Thank You

## 2025-01-24 ENCOUNTER — Other Ambulatory Visit: Payer: Self-pay | Admitting: Family Medicine

## 2025-01-24 MED ORDER — EMPAGLIFLOZIN 25 MG PO TABS: 25.0000 mg | ORAL_TABLET | Freq: Every day | ORAL | 3 refills | Status: AC

## 2025-01-24 MED ORDER — SITAGLIPTIN PHOSPHATE 100 MG PO TABS: 100.0000 mg | ORAL_TABLET | Freq: Every day | ORAL | 1 refills | Status: AC

## 2025-01-24 NOTE — Progress Notes (Signed)
 Today called pharmacy and check on the Jardiance  which I just sent in to see if it is cheaper.  If he wants you then you can go ahead and send in Januvia  100 mg daily and give him 6 months worth.  It is not going to be as good of a medicine

## 2025-01-24 NOTE — Progress Notes (Signed)
 Meant to say has he called the pharmacy today and checked

## 2025-01-24 NOTE — Telephone Encounter (Signed)
 Pt said he just had an appt with Mliss. He does not qualify for assistance.  He cannot afford the $400 first.  Pt wants to know if Januvia  is an option?  I dont think his insurance will cover that one either.

## 2025-01-24 NOTE — Addendum Note (Signed)
 Addended by: LEIGH ROSINA SAILOR on: 01/24/2025 04:38 PM   Modules accepted: Orders

## 2025-01-24 NOTE — Progress Notes (Signed)
 It is where it is the beginning of the year. The pt states he is not going to pay the $400

## 2025-01-24 NOTE — Telephone Encounter (Signed)
 Patient has 2 options, he can try for prescription assistance with Mliss and we can check to see if we have any samples to help him until we get that assistance is likely the best option or he can pay down the back of all and then it should be cheaper after that.  The only other option is we can send Jardiance  but it is likely going to have the same issue.  Please place referral to Mliss for him and check for samples for Farxiga 

## 2025-01-24 NOTE — Progress Notes (Signed)
 Januvia  is not as good of medicine at least once, lets try the Jardiance  first, call the pharmacy and see what that is going to cost but if still not affordable then we could try something different like maybe Januvia 

## 2025-01-24 NOTE — Telephone Encounter (Signed)
 [facility]  100mg  [facility] [place] [facility] Dr. Maryanne. Pt made aware.

## 2025-01-25 ENCOUNTER — Ambulatory Visit: Payer: Medicare Other

## 2025-01-25 VITALS — BP 121/70 | HR 62 | Temp 98.4°F | Ht 73.0 in | Wt 232.8 lb

## 2025-01-25 DIAGNOSIS — Z0000 Encounter for general adult medical examination without abnormal findings: Secondary | ICD-10-CM

## 2025-01-25 NOTE — Progress Notes (Cosign Needed)
 "  Chief Complaint  Patient presents with   Medicare Wellness     Subjective:   Craig Arnold is a 69 y.o. male who presents for a Medicare Annual Wellness Visit.  Visit info / Clinical Intake: Medicare Wellness Visit Type:: Subsequent Annual Wellness Visit Persons participating in visit and providing information:: patient Medicare Wellness Visit Mode:: In-person (required for WTM) Interpreter Needed?: No Pre-visit prep was completed: yes AWV questionnaire completed by patient prior to visit?: no Living arrangements:: (!) lives alone Patient's Overall Health Status Rating: very good Typical amount of pain: none Does pain affect daily life?: no Are you currently prescribed opioids?: no  Dietary Habits and Nutritional Risks How many meals a day?: 3 Eats fruit and vegetables daily?: yes Most meals are obtained by: preparing own meals In the last 2 weeks, have you had any of the following?: none Diabetic:: (!) yes Any non-healing wounds?: no How often do you check your BS?: 1 Would you like to be referred to a Nutritionist or for Diabetic Management? : no  Functional Status Activities of Daily Living (to include ambulation/medication): Independent Ambulation: Independent Medication Administration: Independent Home Management (perform basic housework or laundry): Independent Manage your own finances?: yes Primary transportation is: driving Concerns about vision?: no *vision screening is required for WTM* (2025) Concerns about hearing?: (!) yes Uses hearing aids?: no  Fall Screening Falls in the past year?: 0 Number of falls in past year: 0 Was there an injury with Fall?: 0 Fall Risk Category Calculator: 0 Patient Fall Risk Level: Low Fall Risk  Fall Risk Patient at Risk for Falls Due to: No Fall Risks Fall risk Follow up: Falls evaluation completed; Education provided  Home and Transportation Safety: All rugs have non-skid backing?: yes All stairs or steps have  railings?: yes Grab bars in the bathtub or shower?: (!) no Have non-skid surface in bathtub or shower?: yes Good home lighting?: yes Regular seat belt use?: yes Hospital stays in the last year:: no  Cognitive Assessment Difficulty concentrating, remembering, or making decisions? : no Will 6CIT or Mini Cog be Completed: yes What year is it?: 0 points What month is it?: 0 points Give patient an address phrase to remember (5 components): [address] [place] About what time is it?: 0 points Count backwards from 20 to 1: 0 points Say the months of the year in reverse: 4 points Repeat the address phrase from earlier: 0 points 6 CIT Score: 4 points  Advance Directives (For Healthcare) Does Patient Have a Medical Advance Directive?: No Would patient like information on creating a medical advance directive?: No - Patient declined  Reviewed/Updated  Reviewed/Updated: Reviewed All (Medical, Surgical, Family, Medications, Allergies, Care Teams, Patient Goals)    Allergies (verified) Patient has no known allergies.   Current Medications (verified) Outpatient Encounter Medications as of 01/25/2025  Medication Sig   Accu-Chek Softclix Lancets lancets CHECK BLOOD SUGAR 3 TIMES DAILY Dx E11.69   albuterol  (VENTOLIN  HFA) 108 (90 Base) MCG/ACT inhaler INHALE 2 PUFFS INTO THE LUNGS EVERY 4 HOURS AS NEEDED   Blood Glucose Monitoring Suppl (ACCU-CHEK GUIDE) w/Device KIT Check BS 3 times a day Dx E11.69   budesonide -formoterol  (SYMBICORT ) 160-4.5 MCG/ACT inhaler INHALE 2 PUFFS INTO THE LUNGS TWICE A DAY (Patient taking differently: Inhale 2 puffs into the lungs daily as needed (Shortness of breath).)   busPIRone  (BUSPAR ) 7.5 MG tablet TAKE 1 TABLET BY MOUTH TWICE A DAY   empagliflozin  (JARDIANCE ) 25 MG TABS tablet Take 1 tablet (  25 mg total) by mouth daily.   glucose blood (ACCU-CHEK GUIDE TEST) test strip CHECK BLOOD SUGAR 3 TIMES DAILY Dx E11.69   Lancets Misc. (ACCU-CHEK SOFTCLIX  LANCET DEV) KIT CHECK BLOOD SUGARS 3 TIMES A DAY Dx E11.69   metFORMIN  (GLUCOPHAGE ) 1000 MG tablet Take 1 tablet (1,000 mg total) by mouth 2 (two) times daily with a meal.   Multiple Vitamins-Minerals (MULTIVITAMIN ADULTS 50+ PO) Take 1 tablet by mouth daily.   olmesartan  (BENICAR ) 20 MG tablet Take 1 tablet (20 mg total) by mouth daily.   omeprazole  (PRILOSEC) 20 MG capsule Take 1 capsule (20 mg total) by mouth daily.   predniSONE  (DELTASONE ) 1 MG tablet TAKE 3 TABLETS (3 MG TOTAL) BY MOUTH DAILY WITH BREAKFAST.   roflumilast  (DALIRESP ) 500 MCG TABS tablet Take 1 tablet (500 mcg total) by mouth daily.   rosuvastatin  (CRESTOR ) 20 MG tablet Take 1 tablet (20 mg total) by mouth daily.   diclofenac  (VOLTAREN ) 75 MG EC tablet TAKE 1 TABLET BY MOUTH TWICE A DAY (Patient not taking: Reported on 01/25/2025)   fluticasone  (FLONASE ) 50 MCG/ACT nasal spray Place 1 spray into both nostrils 2 (two) times daily as needed for allergies or rhinitis. (Patient not taking: Reported on 01/25/2025)   loratadine (CLARITIN) 10 MG tablet Take 10 mg by mouth daily as needed for allergies.  [employer] brand (Patient not taking: Reported on 01/25/2025)   sitaGLIPtin  (JANUVIA ) 100 MG tablet Take 1 tablet (100 mg total) by mouth daily. (Patient not taking: Reported on 01/25/2025)   No facility-administered encounter medications on file as of 01/25/2025.    History: Past Medical History:  Diagnosis Date   Asthma    Atypical mycobacterial infection    Bronchiectasis    COPD (chronic obstructive pulmonary disease) (HCC)    Diabetes mellitus without complication (HCC)    GERD (gastroesophageal reflux disease)    HTN (hypertension)    Pulmonary nodule    Past Surgical History:  Procedure Laterality Date   BASAL CELL CARCINOMA EXCISION     face   LEFT HEART CATH AND CORONARY ANGIOGRAPHY N/A 11/07/2024   Procedure: LEFT HEART CATH AND CORONARY ANGIOGRAPHY;  Surgeon: Elmira Newman PARAS, MD;  Location: [facility];   Service: Cardiovascular;  Laterality: N/A;   Family History  Problem Relation Age of Onset   Rheum arthritis Mother    Diabetes Mother    Emphysema Father    COPD Father    Heart disease Father    Stroke Father    Heart attack Father 91   Diabetes Brother    Heart attack Brother 77   Diabetes Brother    Heart disease Maternal Grandfather    Heart disease Paternal Uncle    Social History   Occupational History   Occupation: aeronautical engineer    Comment: retired  Tobacco Use   Smoking status: Former    Current packs/day: 0.00    Average packs/day: 1 pack/day for 40.0 years (40.0 ttl pk-yrs)    Types: Cigarettes    Start date: 07/21/1968    Quit date: 07/21/2008    Years since quitting: 16.5   Smokeless tobacco: Never  Vaping Use   Vaping status: Never Used  Substance and Sexual Activity   Alcohol use: No   Drug use: No   Sexual activity: Never   Tobacco Counseling Counseling given: Yes  SDOH Screenings   Food Insecurity: No Food Insecurity (01/25/2024)  Housing: Unknown (01/25/2025)  Transportation Needs: No Transportation Needs (01/25/2025)  Utilities:  Not At Risk (01/25/2025)  Alcohol Screen: Low Risk (01/25/2024)  Depression (PHQ2-9): Low Risk (01/25/2025)  Financial Resource Strain: Low Risk (01/25/2024)  Physical Activity: Sufficiently Active (01/25/2024)  Social Connections: Moderately Isolated (01/25/2025)  Stress: No Stress Concern Present (01/25/2025)  Tobacco Use: Medium Risk (01/25/2025)  Health Literacy: Adequate Health Literacy (01/25/2025)   See flowsheets for full screening details  Depression Screen PHQ 2 & 9 Depression Scale- Over the past 2 weeks, how often have you been bothered by any of the following problems? Little interest or pleasure in doing things: 0 Feeling down, depressed, or hopeless (PHQ Adolescent also includes...irritable): 0 PHQ-2 Total Score: 0 Trouble falling or staying asleep, or sleeping too much: 0 Feeling tired or having little energy: 0 Poor  appetite or overeating (PHQ Adolescent also includes...weight loss): 0 Feeling bad about yourself - or that you are a failure or have let yourself or your family down: 0 Trouble concentrating on things, such as reading the newspaper or watching television (PHQ Adolescent also includes...like school work): 0 Moving or speaking so slowly that other people could have noticed. Or the opposite - being so fidgety or restless that you have been moving around a lot more than usual: 0 Thoughts that you would be better off dead, or of hurting yourself in some way: 0 PHQ-9 Total Score: 0 If you checked off any problems, how difficult have these problems made it for you to do your work, take care of things at home, or get along with other people?: Not difficult at all  Depression Treatment Depression Interventions/Treatment : EYV7-0 Score <4 Follow-up Not Indicated     Goals Addressed             This Visit's Progress    Remain active and independent   On track            Objective:    Today's Vitals   01/25/25 1045  Weight: 232 lb 12.8 oz (105.6 kg)  Height: 6' 1 (1.854 m)   Body mass index is 30.71 kg/m.  Hearing/Vision screen No results found. Immunizations and Health Maintenance Health Maintenance  Topic Date Due   Zoster Vaccines- Shingrix (1 of 2) 03/06/2025 (Originally 01/08/2007)   Influenza Vaccine  03/20/2025 (Originally 07/21/2024)   Pneumococcal Vaccine: 50+ Years (1 of 2 - PCV) 06/07/2025 (Originally 01/09/1976)   Colonoscopy  09/11/2025 (Originally 03/10/2023)   DTaP/Tdap/Td (2 - Tdap) 12/18/2025 (Originally 08/15/2017)   OPHTHALMOLOGY EXAM  02/02/2025   FOOT EXAM  03/06/2025   Diabetic kidney evaluation - Urine ACR  03/12/2025   HEMOGLOBIN A1C  06/18/2025   Diabetic kidney evaluation - eGFR measurement  12/18/2025   Medicare Annual Wellness (AWV)  01/25/2026   Hepatitis C Screening  Completed   Meningococcal B Vaccine  Aged Out   Lung Cancer Screening  Discontinued    COVID-19 Vaccine  Discontinued   Fecal DNA (Cologuard)  Discontinued        Assessment/Plan:  This is a routine wellness examination for Ashley.  Patient Care Team: Dettinger, Fonda LABOR, MD as PCP - General (Family Medicine) Lavona Agent, MD as PCP - Cardiology (Cardiology) Vicci Mcardle, OD (Optometry) Billee Mliss BIRCH, RPH-CPP as [facility] Management (Pharmacist) Dr. Cheryal as Consulting Physician (Pulmonary Disease)  I have personally reviewed and noted the following in the patients chart:   Medical and social history Use of alcohol, tobacco or illicit drugs  Current medications and supplements including opioid prescriptions. Functional ability and status Nutritional status  Physical activity Advanced directives List of other physicians Hospitalizations, surgeries, and ER visits in previous 12 months Vitals Screenings to include cognitive, depression, and falls Referrals and appointments  No orders of the defined types were placed in this encounter.  In addition, I have reviewed and discussed with patient certain preventive protocols, quality metrics, and best practice recommendations. A written personalized care plan for preventive services as well as general preventive health recommendations were provided to patient.   Ozie Ned, CMA   01/25/2025   Return in 1 year (on 01/25/2026).  After Visit Summary: (MyChart) Due to this being a telephonic visit, the after visit summary with patients personalized plan was offered to patient via MyChart   Nurse Notes: n/a "

## 2025-01-25 NOTE — Addendum Note (Signed)
 Addended by: DEBBY SIMPER on: 01/25/2025 12:19 PM   Modules accepted: Level of Service

## 2025-01-25 NOTE — Patient Instructions (Signed)
 Craig Arnold,  Thank you for taking the time for your Medicare Wellness Visit. I appreciate your continued commitment to your health goals. Please review the care plan we discussed, and feel free to reach out if I can assist you further.  Please note that Annual Wellness Visits do not include a physical exam. Some assessments may be limited, especially if the visit was conducted virtually. If needed, we may recommend an in-person follow-up with your provider.  Ongoing Care Seeing your primary care provider every 3 to 6 months helps us  monitor your health and provide consistent, personalized care.   Referrals If a referral was made during today's visit and you haven't received any updates within two weeks, please contact the referred provider directly to check on the status.  Recommended Screenings:  Health Maintenance  Topic Date Due   Medicare Annual Wellness Visit  01/24/2025   Zoster (Shingles) Vaccine (1 of 2) 03/06/2025*   Flu Shot  03/20/2025*   Pneumococcal Vaccine for age over 38 (1 of 2 - PCV) 06/07/2025*   Colon Cancer Screening  09/11/2025*   DTaP/Tdap/Td vaccine (2 - Tdap) 12/18/2025*   Eye exam for diabetics  02/02/2025   Complete foot exam   03/06/2025   Kidney health urinalysis for diabetes  03/12/2025   Hemoglobin A1C  06/18/2025   Yearly kidney function blood test for diabetes  12/18/2025   Hepatitis C Screening  Completed   Meningitis B Vaccine  Aged Out   Screening for Lung Cancer  Discontinued   COVID-19 Vaccine  Discontinued   Cologuard (Stool DNA test)  Discontinued  *Topic was postponed. The date shown is not the original due date.       01/25/2025   10:58 AM  Advanced Directives  Does Patient Have a Medical Advance Directive? No  Would patient like information on creating a medical advance directive? No - Patient declined    Vision: Annual vision screenings are recommended for early detection of glaucoma, cataracts, and diabetic retinopathy. These exams  can also reveal signs of chronic conditions such as diabetes and high blood pressure.  Dental: Annual dental screenings help detect early signs of oral cancer, gum disease, and other conditions linked to overall health, including heart disease and diabetes.  Please see the attached documents for additional preventive care recommendations.

## 2025-02-09 ENCOUNTER — Ambulatory Visit: Admitting: Cardiology

## 2025-02-14 ENCOUNTER — Ambulatory Visit: Admitting: Emergency Medicine

## 2025-03-19 ENCOUNTER — Ambulatory Visit: Admitting: Family Medicine

## 2026-01-29 ENCOUNTER — Ambulatory Visit
